# Patient Record
Sex: Male | Born: 1955 | Race: White | Hispanic: No | Marital: Married | Smoking: Never smoker
Health system: Southern US, Community
[De-identification: ages and names within clinical notes are randomized; demographics above are authoritative.]

## PROBLEM LIST (undated history)

## (undated) DIAGNOSIS — I10 Essential (primary) hypertension: Secondary | ICD-10-CM

## (undated) DIAGNOSIS — N529 Male erectile dysfunction, unspecified: Secondary | ICD-10-CM

## (undated) DIAGNOSIS — I619 Nontraumatic intracerebral hemorrhage, unspecified: Secondary | ICD-10-CM

## (undated) DIAGNOSIS — Z9289 Personal history of other medical treatment: Secondary | ICD-10-CM

## (undated) DIAGNOSIS — C801 Malignant (primary) neoplasm, unspecified: Secondary | ICD-10-CM

## (undated) HISTORY — PX: NOSE SURGERY: SHX723

## (undated) HISTORY — DX: Malignant (primary) neoplasm, unspecified: C80.1

## (undated) HISTORY — DX: Essential (primary) hypertension: I10

## (undated) HISTORY — DX: Personal history of other medical treatment: Z92.89

## (undated) HISTORY — PX: AORTIC VALVE REPLACEMENT: SHX41

## (undated) HISTORY — DX: Male erectile dysfunction, unspecified: N52.9

---

## 2004-05-27 ENCOUNTER — Inpatient Hospital Stay (HOSPITAL_COMMUNITY): Admission: EM | Admit: 2004-05-27 | Discharge: 2004-05-28 | Payer: Self-pay | Admitting: Emergency Medicine

## 2013-08-29 ENCOUNTER — Ambulatory Visit (INDEPENDENT_AMBULATORY_CARE_PROVIDER_SITE_OTHER): Payer: BC Managed Care – PPO | Admitting: Emergency Medicine

## 2013-08-29 ENCOUNTER — Other Ambulatory Visit: Payer: Self-pay | Admitting: Emergency Medicine

## 2013-08-29 VITALS — BP 140/80 | HR 86 | Temp 98.6°F | Resp 16 | Ht 73.0 in | Wt 244.0 lb

## 2013-08-29 DIAGNOSIS — IMO0001 Reserved for inherently not codable concepts without codable children: Secondary | ICD-10-CM

## 2013-08-29 DIAGNOSIS — R0789 Other chest pain: Secondary | ICD-10-CM

## 2013-08-29 DIAGNOSIS — K625 Hemorrhage of anus and rectum: Secondary | ICD-10-CM

## 2013-08-29 DIAGNOSIS — Z0000 Encounter for general adult medical examination without abnormal findings: Secondary | ICD-10-CM

## 2013-08-29 DIAGNOSIS — R141 Gas pain: Secondary | ICD-10-CM

## 2013-08-29 DIAGNOSIS — I10 Essential (primary) hypertension: Secondary | ICD-10-CM

## 2013-08-29 DIAGNOSIS — R251 Tremor, unspecified: Secondary | ICD-10-CM

## 2013-08-29 DIAGNOSIS — R259 Unspecified abnormal involuntary movements: Secondary | ICD-10-CM

## 2013-08-29 DIAGNOSIS — R030 Elevated blood-pressure reading, without diagnosis of hypertension: Secondary | ICD-10-CM

## 2013-08-29 DIAGNOSIS — R140 Abdominal distension (gaseous): Secondary | ICD-10-CM

## 2013-08-29 LAB — POCT URINALYSIS DIPSTICK
Blood, UA: NEGATIVE
Ketones, UA: NEGATIVE
Leukocytes, UA: NEGATIVE
Nitrite, UA: NEGATIVE
Protein, UA: NEGATIVE
Spec Grav, UA: 1.02
Urobilinogen, UA: 0.2
pH, UA: 6.5

## 2013-08-29 LAB — TSH: TSH: 1.88 u[IU]/mL (ref 0.350–4.500)

## 2013-08-29 LAB — IFOBT (OCCULT BLOOD): IFOBT: NEGATIVE

## 2013-08-29 LAB — COMPREHENSIVE METABOLIC PANEL
Alkaline Phosphatase: 37 U/L — ABNORMAL LOW (ref 39–117)
BUN: 13 mg/dL (ref 6–23)
Calcium: 9 mg/dL (ref 8.4–10.5)
Chloride: 106 mEq/L (ref 96–112)
Potassium: 4.1 mEq/L (ref 3.5–5.3)
Sodium: 138 mEq/L (ref 135–145)

## 2013-08-29 LAB — POCT CBC
Granulocyte percent: 67.3 %G (ref 37–80)
Hemoglobin: 15.1 g/dL (ref 14.1–18.1)
MCH, POC: 31.8 pg — AB (ref 27–31.2)
MCHC: 32.5 g/dL (ref 31.8–35.4)
MID (cbc): 0.6 (ref 0–0.9)
POC Granulocyte: 6.1 (ref 2–6.9)
POC LYMPH PERCENT: 26 %L (ref 10–50)
Platelet Count, POC: 200 10*3/uL (ref 142–424)

## 2013-08-29 LAB — LIPID PANEL
HDL: 44 mg/dL (ref >39)
LDL Cholesterol: 173 mg/dL — ABNORMAL HIGH (ref 0–99)
VLDL: 20 mg/dL (ref 0–40)

## 2013-08-29 MED ORDER — LOSARTAN POTASSIUM 100 MG PO TABS: 100.0000 mg | ORAL_TABLET | Freq: Every day | ORAL | Status: DC

## 2013-08-29 NOTE — Patient Instructions (Signed)
I am making referral to see the cardiologist and the neurologist. All of your basic blood work has been done. I will call you with the results of this. Patient declines flu shot patient declined shingles vaccine

## 2013-08-29 NOTE — Progress Notes (Addendum)
Subjective:    Patient ID: Shane Sawyer, male    DOB: 1955/11/03, 57 y.o.   MRN: [id number] This chart was scribed for Collene Gobble, MD by Valera Castle, ED Scribe. This patient was seen in room 8 and the patient's care was started at 10:30 AM.  HPI HPI Comments: Shane Sawyer is a 57 y.o. male who presents to the Emergency Department requesting an annual exam.    He reports being on blood pressure medicine, but when it ran out he started using his mother's prescription. He reports that he eats decently, and plans to work out again soon. He denies eating this morning, and only having a small amount of fluids.   He reports recent chest tightness and feeling "funny", onset 1-2 weeks ago. He states associated numbness and tingling in his feet and his hands.. He also reports chills, and "the shakes" where his hands look and feel unsteady. He reports having a stress test about 2 years ago, and everything came back negative. He also reports getting bloated often after eating, even if he doesn't eat very much. He states he gets full quickly.   He reports recently having a colonoscopy. He reports a h/o hemorrhoids from lifting weights. He reports being able to manage those. He reports EtOH use, about 6 beers a week, but denies h/o smoking. He denies IV drug use, cocaine use. He reports that his dad has cancer in his bladder that spread to his lymph nodes. He reports her mother has skin cancer. His mother had an aortic valve stent placement.   [place]   PCP - No primary provider on file.  There are no active problems to display for this patient.  No past medical history on file. No past surgical history on file.  Allergies  Allergen Reactions  . Ivp Dye [Iodinated Diagnostic Agents]   . Penicillins   . Sulfur    Prior to Admission medications   Medication Sig Start Date End Date Taking? Authorizing Provider  losartan (COZAAR) 100 MG tablet Take 100  mg by mouth daily.    Historical Provider, MD    Review of Systems  Constitutional: Positive for chills.  Respiratory: Positive for chest tightness.   Genitourinary:       Recurring hemorrhoids (manageable).  Neurological: Positive for tremors and numbness (and tingling in hands and feet).      Objective:   Physical Exam  Nursing note and vitals reviewed. Constitutional: He is oriented to person, place, and time. He appears well-developed and well-nourished. No distress.  HENT:  Head: Normocephalic and atraumatic.  Right Ear: External ear normal.  Left Ear: External ear normal.  Eyes: EOM are normal. Pupils are equal, round, and reactive to light.  Neck: Neck supple.  Cardiovascular: Normal rate.  Exam reveals no gallop and no friction rub.   No murmur heard. Mitral click noted on exam.   Pulmonary/Chest: Effort normal. No respiratory distress. He has no wheezes. He has no rales.  Genitourinary: Prostate normal.  External hemorrhoids.  Musculoskeletal: Normal range of motion.  Neurological: He is alert and oriented to person, place, and time.  Fine resting tremor of bilateral arms, worse on the left.   Skin: Skin is warm and dry.  Psychiatric: He has a normal mood and affect. His behavior is normal.    Filed Vitals:   08/29/13 0935  BP: 140/80  Pulse: 86  Temp: 98.6 F (37 C)  TempSrc: Oral  Resp: 16  Height: 6\' 1"  (1.854 m)  Weight: 244 lb (110.678 kg)  SpO2: 97%  EKG is left atrial enlargement no other abnormalities. Results for orders placed in visit on 08/29/13  POCT CBC      Result Value Range   WBC 9.0  4.6 - 10.2 K/uL   Lymph, poc 2.3  0.6 - 3.4   POC LYMPH PERCENT 26.0  10 - 50 %L   MID (cbc) 0.6  0 - 0.9   POC MID % 6.7  0 - 12 %M   POC Granulocyte 6.1  2 - 6.9   Granulocyte percent 67.3  37 - 80 %G   RBC 4.75  4.69 - 6.13 M/uL   Hemoglobin 15.1  14.1 - 18.1 g/dL   HCT, POC 45.4  09.8 - 53.7 %   MCV 97.9 (*) 80 - 97 fL   MCH, POC 31.8 (*) 27 - 31.2  pg   MCHC 32.5  31.8 - 35.4 g/dL   RDW, POC 11.9     Platelet Count, POC 200  142 - 424 K/uL   MPV 9.6  0 - 99.8 fL  GLUCOSE, POCT (MANUAL RESULT ENTRY)      Result Value Range   POC Glucose 88  70 - 99 mg/dl  IFOBT (OCCULT BLOOD)      Result Value Range   IFOBT Negative    POCT URINALYSIS DIPSTICK      Result Value Range   Color, UA yellow     Clarity, UA clear     Glucose, UA neg     Bilirubin, UA neg     Ketones, UA neg     Spec Grav, UA 1.020     Blood, UA neg     pH, UA 6.5     Protein, UA neg     Urobilinogen, UA 0.2     Nitrite, UA neg     Leukocytes, UA Negative        Assessment & Plan:  Full physical was performed. His losartan was refilled. Neurology referral was made because of his tremors. Cardiology referral was made because of his episode of chest pain about a month ago.    I personally performed the services described in this documentation, which was scribed in my presence. The recorded information has been reviewed and is accurate.

## 2013-08-31 LAB — HEPATITIS B SURFACE ANTIGEN: Hepatitis B Surface Ag: NEGATIVE

## 2013-09-10 ENCOUNTER — Ambulatory Visit (INDEPENDENT_AMBULATORY_CARE_PROVIDER_SITE_OTHER): Payer: BC Managed Care – PPO | Admitting: Neurology

## 2013-09-10 ENCOUNTER — Encounter: Payer: Self-pay | Admitting: Neurology

## 2013-09-10 VITALS — BP 140/98 | HR 88 | Ht 74.0 in | Wt 246.0 lb

## 2013-09-10 DIAGNOSIS — I10 Essential (primary) hypertension: Secondary | ICD-10-CM

## 2013-09-10 DIAGNOSIS — R251 Tremor, unspecified: Secondary | ICD-10-CM

## 2013-09-10 DIAGNOSIS — R259 Unspecified abnormal involuntary movements: Secondary | ICD-10-CM

## 2013-09-12 ENCOUNTER — Ambulatory Visit: Payer: BC Managed Care – PPO | Admitting: Neurology

## 2013-09-13 ENCOUNTER — Telehealth: Payer: Self-pay | Admitting: Emergency Medicine

## 2013-09-13 ENCOUNTER — Ambulatory Visit
Admission: RE | Admit: 2013-09-13 | Discharge: 2013-09-13 | Disposition: A | Discharge status: Home / Self Care | Payer: BC Managed Care – HMO | Source: Ambulatory Visit | Attending: Emergency Medicine | Admitting: Emergency Medicine

## 2013-09-13 ENCOUNTER — Other Ambulatory Visit: Payer: Self-pay | Admitting: Emergency Medicine

## 2013-09-13 DIAGNOSIS — R935 Abnormal findings on diagnostic imaging of other abdominal regions, including retroperitoneum: Secondary | ICD-10-CM

## 2013-09-13 DIAGNOSIS — R140 Abdominal distension (gaseous): Secondary | ICD-10-CM

## 2013-09-13 NOTE — Telephone Encounter (Signed)
done

## 2013-09-13 NOTE — Telephone Encounter (Signed)
I have reviewed his allergies and he is allergic to IVP dye. Please schedule an MRI of the liver so we can avoid iodinated contrast.

## 2013-09-14 NOTE — Progress Notes (Signed)
[facility]  PATIENT: Yahmir Sokolov DOB: 1956/08/24  HISTORICAL Mr. Kamau is a 57 years old right-handed male, referred by his primary care physician Dr. Niclas Jester for evaluation of tremor  He had a past medical history of hypertension, since he was a kid, especially after exertion, getting worse since 2013, sometimes he can feel himself shaking by putting his hands at his chest, involving both left and right hands, most noticeable when he writes, sometimes holding utensils, but does not interfering with his daily activities,  He denies trouble walking no loss sense of smell, no weakness, he complains of excessive fatigue, reported recent laboratory evaluations, with normal TSH. He denies family history of tremor.   REVIEW OF SYSTEMS: Full 14 system review of systems performed and notable only for fatigue, chest pain, snoring, flushing, achy muscles, allergies, numbness, tremor,  ALLERGIES: Allergies  Allergen Reactions  . Ivp Dye [Iodinated Diagnostic Agents]   . Penicillins   . Sulfur     HOME MEDICATIONS: Outpatient Prescriptions Prior to Visit  Medication Sig Dispense Refill  . losartan (COZAAR) 100 MG tablet Take 1 tablet (100 mg total) by mouth daily.  30 tablet  11   No facility-administered medications prior to visit.    PAST MEDICAL HISTORY: Past Medical History  Diagnosis Date  . HBP (high blood pressure)     PAST SURGICAL HISTORY: Past Surgical History  Procedure Laterality Date  . Nose surgery      times 2    FAMILY HISTORY: Family History  Problem Relation Age of Onset  . Cancer Father     SOCIAL HISTORY:  History   Social History  . Marital Status: Single    Spouse Name: N/A    Number of Children: N/A  . Years of Education: 12+   Occupational History  . Not on file.   Social History Main Topics  . Smoking status: Never Smoker   . Smokeless tobacco: Never Used  . Alcohol Use: Yes     Comment: weekly   . Drug Use: No  .  Sexual Activity: Not on file   Other Topics Concern  . Not on file   Social History Narrative   Patient lives at home girlfriend Maretta Los.   Patient has no children but one the way.    Patient is currently working, he buys cars in sell them.   Patient has some college.     PHYSICAL EXAM   Filed Vitals:   09/10/13 0856  BP: 140/98  Pulse: 88  Height: 6\' 2"  (1.88 m)  Weight: 246 lb (111.585 kg)    Not recorded    Body mass index is 31.57 kg/(m^2).   Generalized: In no acute distress  Neck: Supple, no carotid bruits   Cardiac: Regular rate rhythm  Pulmonary: Clear to auscultation bilaterally  Musculoskeletal: No deformity  Neurological examination  Mentation: Alert oriented to time, place, history taking, and causual conversation  Cranial nerve II-XII: Pupils were equal round reactive to light extraocular movements were full, visual field were full on confrontational test. facial sensation and strength were normal. hearing was intact to finger rubbing bilaterally. Uvula tongue midline.  head turning and shoulder shrug and were normal and symmetric.Tongue protrusion into cheek strength was normal.  Motor: normal tone, bulk and strength, mild bilateral hand postural tremor.  Sensory: Intact to fine touch, pinprick, preserved vibratory sensation, and proprioception at toes.  Coordination: Normal finger to nose, heel-to-shin bilaterally there was no truncal ataxia  Gait:  Rising up from seated position without assistance, normal stance, without trunk ataxia, moderate stride, good arm swing, smooth turning, able to perform tiptoe, and heel walking without difficulty.   Romberg signs: Negative  Deep tendon reflexes: Brachioradialis 2/2, biceps 2/2, triceps 2/2, patellar 2/2, Achilles 2/2, plantar responses were flexor bilaterally.   DIAGNOSTIC DATA (LABS, IMAGING, TESTING) - I reviewed patient records, labs, notes, testing and imaging myself where  available.  Lab Results  Component Value Date   WBC 9.0 08/29/2013   HGB 15.1 08/29/2013   HCT 46.5 08/29/2013   MCV 97.9* 08/29/2013      Component Value Date/Time   NA 138 08/29/2013 1058   K 4.1 08/29/2013 1058   CL 106 08/29/2013 1058   CO2 24 08/29/2013 1058   GLUCOSE 98 08/29/2013 1058   BUN 13 08/29/2013 1058   CREATININE 0.89 08/29/2013 1058   CALCIUM 9.0 08/29/2013 1058   PROT 6.8 08/29/2013 1058   ALBUMIN 4.0 08/29/2013 1058   AST 30 08/29/2013 1058   ALT 59* 08/29/2013 1058   ALKPHOS 37* 08/29/2013 1058   BILITOT 0.8 08/29/2013 1058   Lab Results  Component Value Date   CHOL 237* 08/29/2013   HDL 44 08/29/2013   LDLCALC 173* 08/29/2013   TRIG 100 08/29/2013   CHOLHDL 5.4 08/29/2013   No results found for this basename: HGBA1C   No results found for this basename: VITAMINB12   Lab Results  Component Value Date   TSH 1.880 08/29/2013      ASSESSMENT AND PLAN    57 years old right-handed Caucasian male, presenting with a long-standing history of postural tremor, most consistent with essential tremor, there was no parkinsonian features, normal laboratory evaluation including TSH, after discussed with patient, we decided to continue to observe his symptoms, come back to clinic if his symptoms worsened.      Levert Feinstein, M.D. Ph.D.  [facility] [address], [address] [place], [place] [address] [phone]

## 2013-09-18 ENCOUNTER — Other Ambulatory Visit: Payer: Self-pay | Admitting: Emergency Medicine

## 2013-09-18 DIAGNOSIS — R935 Abnormal findings on diagnostic imaging of other abdominal regions, including retroperitoneum: Secondary | ICD-10-CM

## 2013-09-27 ENCOUNTER — Ambulatory Visit
Admission: RE | Admit: 2013-09-27 | Discharge: 2013-09-27 | Disposition: A | Discharge status: Home / Self Care | Payer: BC Managed Care – HMO | Source: Ambulatory Visit | Attending: Emergency Medicine | Admitting: Emergency Medicine

## 2013-09-27 DIAGNOSIS — R935 Abnormal findings on diagnostic imaging of other abdominal regions, including retroperitoneum: Secondary | ICD-10-CM

## 2013-09-27 MED ORDER — GADOBENATE DIMEGLUMINE 529 MG/ML IV SOLN
20.0000 mL | Freq: Once | INTRAVENOUS | Status: AC | PRN
  Administered 2013-09-27: 20 mL via INTRAVENOUS

## 2013-10-17 ENCOUNTER — Telehealth: Payer: Self-pay

## 2013-10-17 NOTE — Telephone Encounter (Signed)
Patient is calling to speak with DR DAUB ONLY about some of his medication please call [phone]

## 2013-10-18 NOTE — Telephone Encounter (Signed)
Dr Cleta Alberts is with patients, I need more information from him. He is taking the losartan. He will not give me any more information he indicates he will only take 2 minutes of your time, and he will not speak to me at all. He is not very nice.

## 2013-10-19 NOTE — Telephone Encounter (Signed)
I returned patient's call to the number requested it and had to leave a voicemail

## 2013-10-19 NOTE — Telephone Encounter (Signed)
Please call.

## 2013-10-19 NOTE — Telephone Encounter (Signed)
Patient called and said he was in the shower but is out and would like Dr. Cleta Alberts to please call again.

## 2013-10-20 NOTE — Telephone Encounter (Signed)
Message left to return call.

## 2013-10-28 ENCOUNTER — Telehealth: Payer: Self-pay

## 2013-10-28 NOTE — Telephone Encounter (Signed)
Please advise on his request for Cialis.

## 2013-10-28 NOTE — Telephone Encounter (Signed)
Wants a diurectic with losartan (COZAAR) 100 MG tablet  Nevermind - got it through the cardiologist   Wants a 30 day supply of cialas  - has a coupon for this  [employer] - [address]   [phone]

## 2013-10-29 NOTE — Telephone Encounter (Signed)
Called him. There is correspondence from Dr Jacinto Halim (media tab). Dr Jacinto Halim discussed alcohol use with patient and prescribed nitroglycerin for him. He also wants him to have stress test and echo. I do not think patient has had these studies. I did not call in the meds. I left message for patient to call me back.

## 2013-10-29 NOTE — Telephone Encounter (Signed)
Patient states he does not take nitroglycerin.  States he did have stress test and echo done and he is fine. He would like Rx filled asap.

## 2013-10-29 NOTE — Telephone Encounter (Signed)
When I saw him for his office visit in the fall he was instructed to see his cardiologist. If he saw his cardiologist and had a good cardiac workup we can call in Cialis 2.5 one a day #30. Instruct him he cannot take nitroglycerin with this medication. If he did not see a cardiologist he needs to get clearance from their office first.

## 2013-10-30 NOTE — Telephone Encounter (Signed)
Call Dr. Verl Dicker office and confirmed that he had a stress test and echo done. If he had these tests  Performed go  ahead and call in Cialis 2.5 one a day he can have #30  No refills

## 2013-11-01 NOTE — Telephone Encounter (Signed)
Called Dr Einar Gip spoke to [facility] and patient has not had them yet. He is having the stress test Monday and echo on Wednesday. He called to reschedule them.

## 2013-12-06 ENCOUNTER — Encounter: Payer: Self-pay | Admitting: Emergency Medicine

## 2014-06-19 ENCOUNTER — Other Ambulatory Visit: Payer: Self-pay

## 2014-06-19 DIAGNOSIS — R030 Elevated blood-pressure reading, without diagnosis of hypertension: Principal | ICD-10-CM

## 2014-06-19 DIAGNOSIS — IMO0001 Reserved for inherently not codable concepts without codable children: Secondary | ICD-10-CM

## 2014-06-19 MED ORDER — LOSARTAN POTASSIUM 100 MG PO TABS: 100.0000 mg | ORAL_TABLET | Freq: Every day | ORAL | Status: DC

## 2014-06-20 ENCOUNTER — Other Ambulatory Visit: Payer: Self-pay

## 2014-06-20 DIAGNOSIS — R030 Elevated blood-pressure reading, without diagnosis of hypertension: Principal | ICD-10-CM

## 2014-06-20 DIAGNOSIS — IMO0001 Reserved for inherently not codable concepts without codable children: Secondary | ICD-10-CM

## 2014-06-20 MED ORDER — LOSARTAN POTASSIUM 100 MG PO TABS: 100.0000 mg | ORAL_TABLET | Freq: Every day | ORAL | Status: DC

## 2014-08-13 IMAGING — US US ABDOMEN COMPLETE
1 series · 13 of 25 positions shown · non-contrast
Comparison: None.

CLINICAL DATA: Bloating.

EXAM:
ULTRASOUND ABDOMEN COMPLETE

[Series 1: us abdomen complete · 0.35mm/px · 13 of 69 slices shown]
[im 1/69]
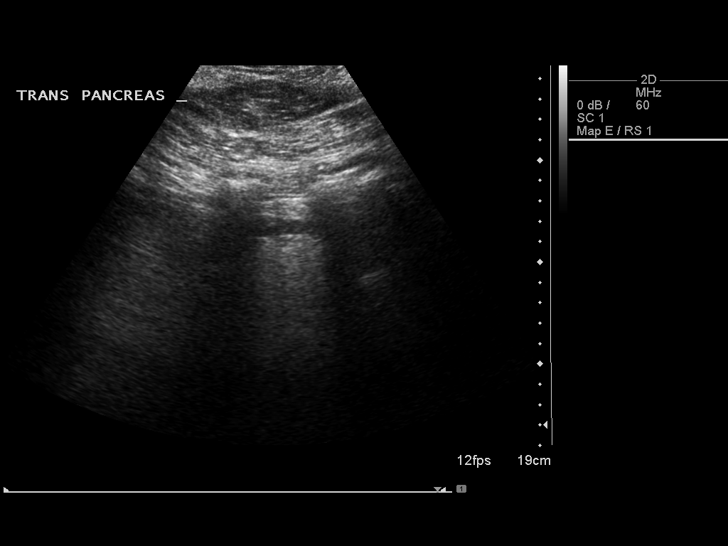
[im 6/69]
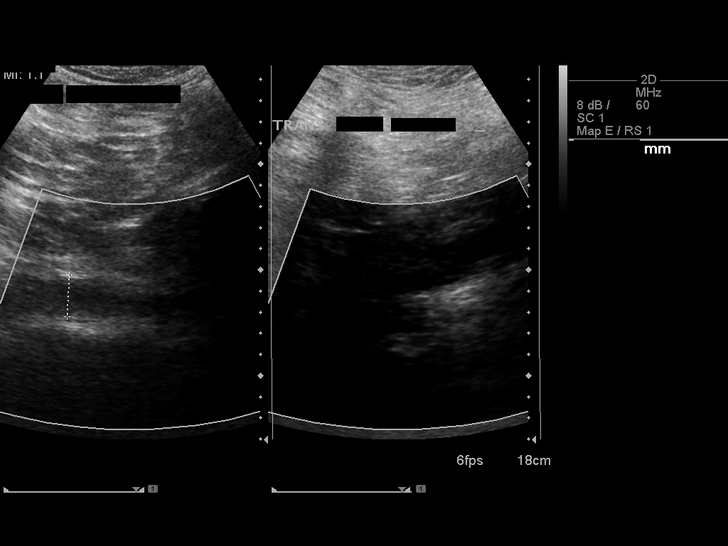
[im 12/69]
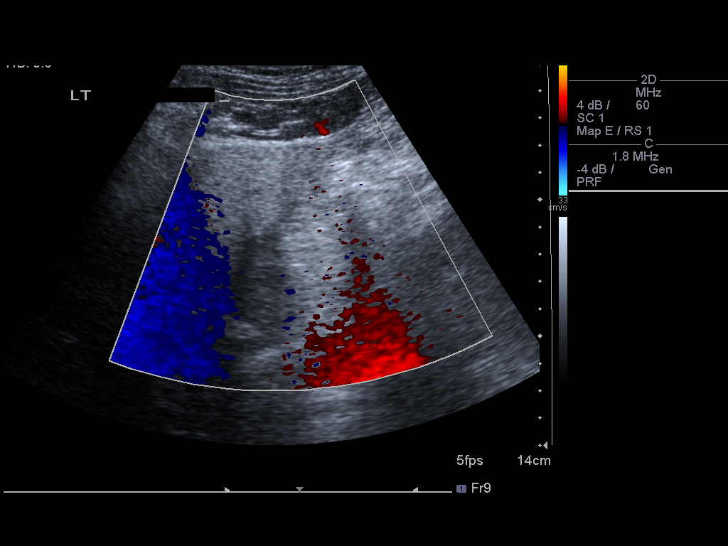
[im 18/69]
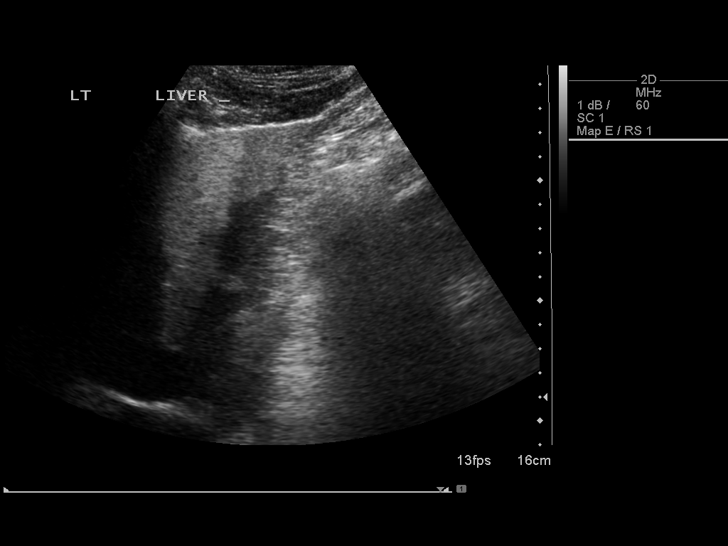
[im 23/69]
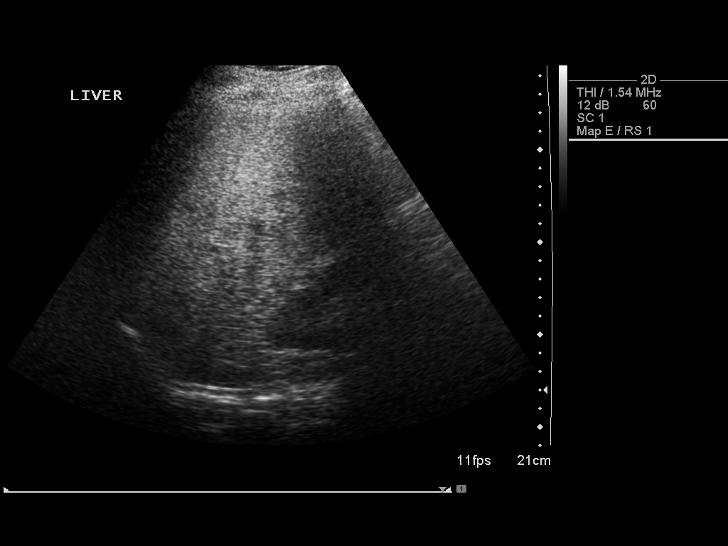
[im 29/69]
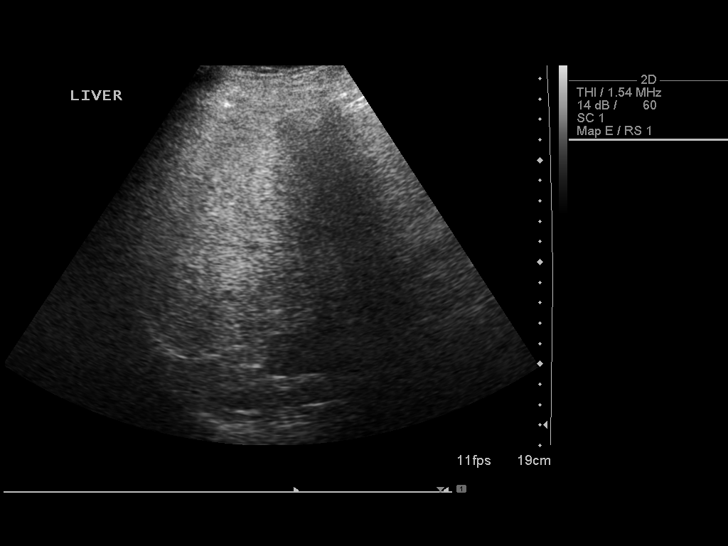
[im 35/69]
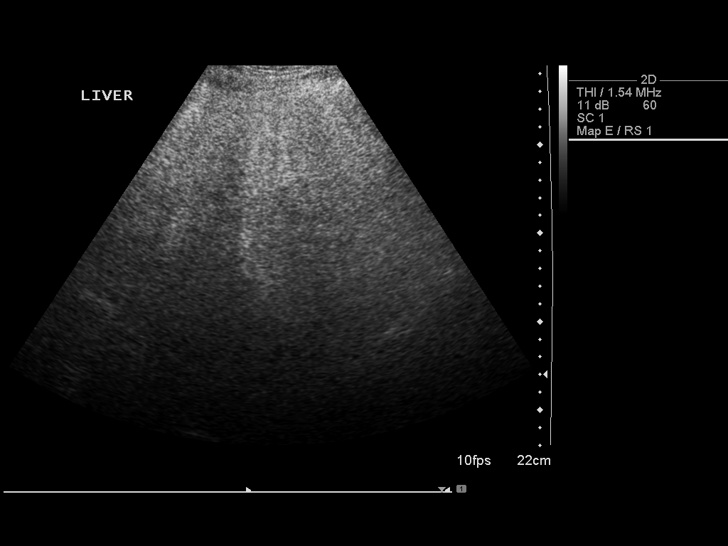
[im 40/69]
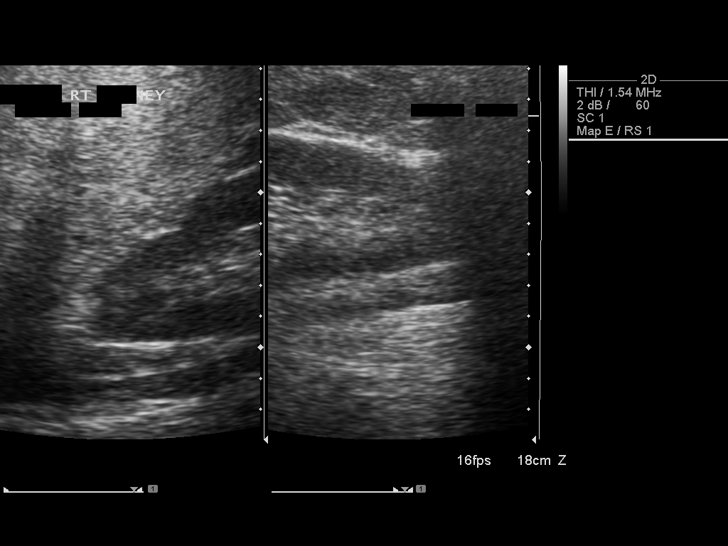
[im 46/69]
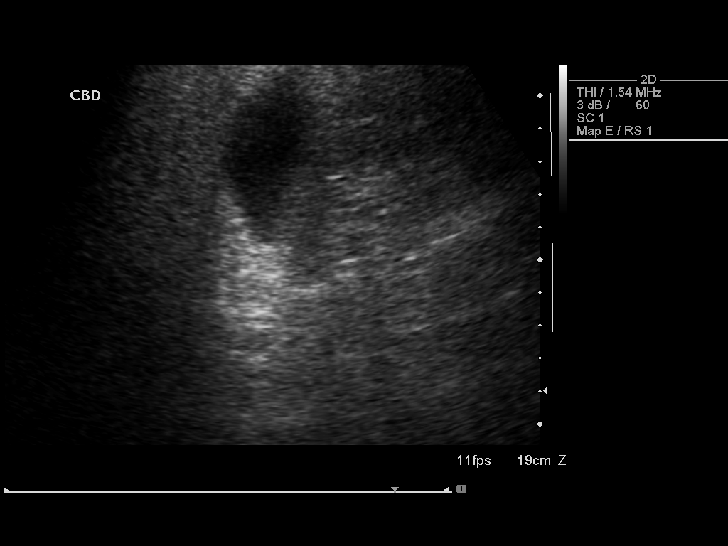
[im 52/69]
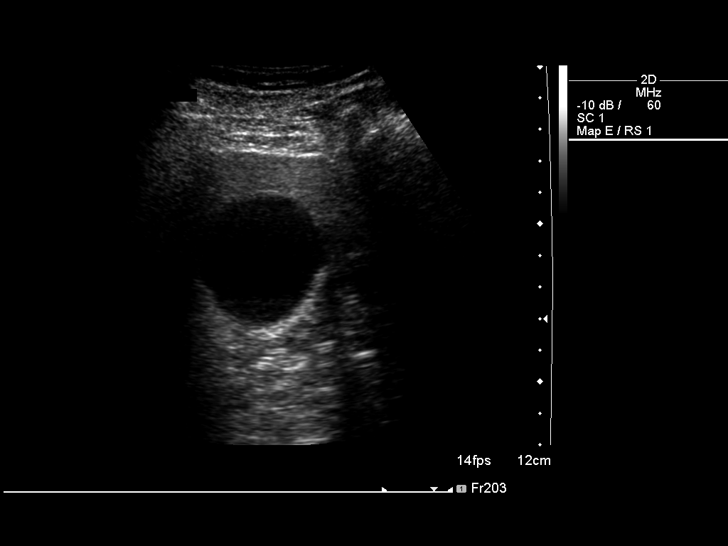
[im 57/69]
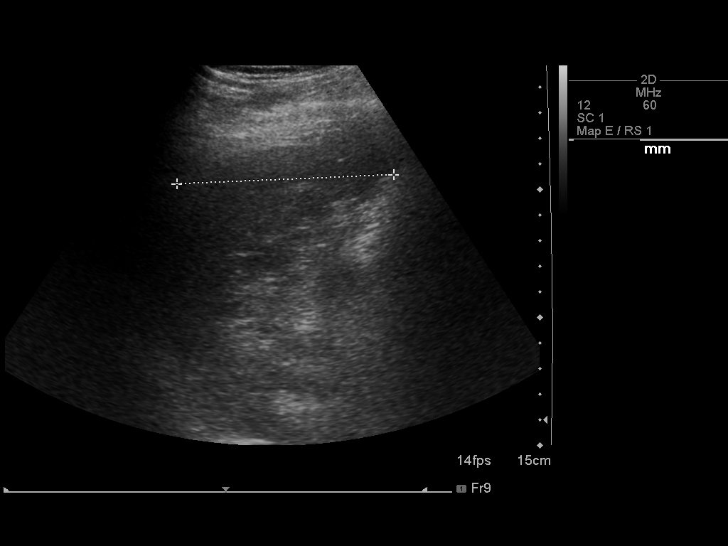
[im 63/69]
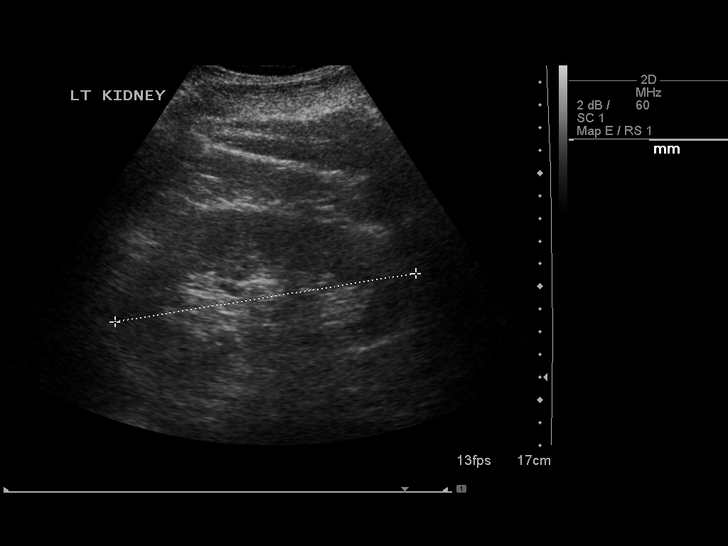
[im 69/69]
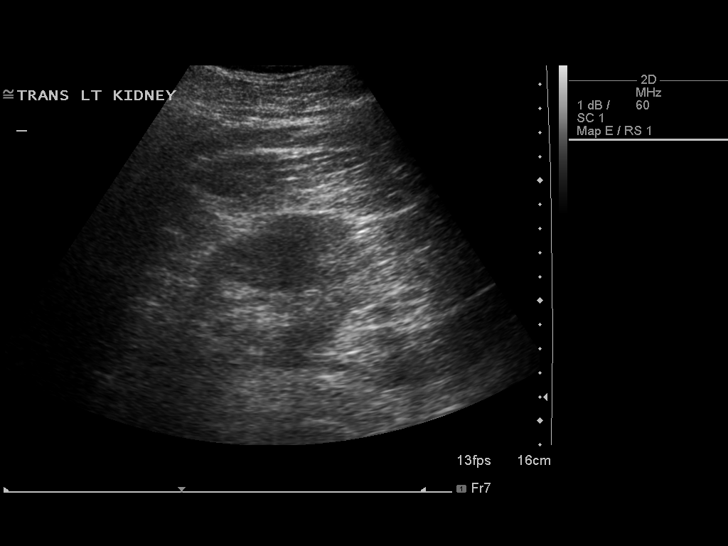

[13 of 25 positions shown; findings below may reference images not displayed]

FINDINGS: Gallbladder

No gallstones or wall thickening visualized. No sonographic Murphy
sign noted.

Common bile duct

Diameter: 2.8 mm.

Liver

Diffuse fatty infiltration of liver noted. Liver measures 18.2 cm in
diameter. There is a relative hypoechoic region in the left lobe of
the liver. This measures approximately 4.2 x 2.2 x 3.0 cm. This
could represent focal fatty sparing versus a mass. CT or MRI of the
liver should be considered for further evaluation .

IVC

No abnormality visualized.

Pancreas

Visualized portion unremarkable.

Spleen

Size and appearance within normal limits.

Right Kidney

Length: 12.5 cm. Echogenicity within normal limits. No mass or
hydronephrosis visualized.

Left Kidney

Length: 13.4 cm. Echogenicity within normal limits. No mass or
hydronephrosis visualized.

Abdominal aorta

No aneurysm visualized.
IMPRESSION: 1. Diffuse fatty infiltration of the liver.
2. Approximately 4.2 cm maximum diameter relative hypoechoic region
in the left lobe of the liver. This may represent focal fatty
sparing. A mass lesion cannot be excluded. CT or MRI of the liver
should be considered for further evaluation.

## 2014-08-19 ENCOUNTER — Other Ambulatory Visit: Payer: Self-pay | Admitting: Emergency Medicine

## 2014-09-07 ENCOUNTER — Ambulatory Visit (INDEPENDENT_AMBULATORY_CARE_PROVIDER_SITE_OTHER): Payer: BC Managed Care – PPO | Admitting: Emergency Medicine

## 2014-09-07 VITALS — BP 138/78 | HR 76 | Temp 98.2°F | Resp 16 | Ht 73.0 in | Wt 237.8 lb

## 2014-09-07 DIAGNOSIS — F1010 Alcohol abuse, uncomplicated: Secondary | ICD-10-CM

## 2014-09-07 DIAGNOSIS — M722 Plantar fascial fibromatosis: Secondary | ICD-10-CM

## 2014-09-07 DIAGNOSIS — I10 Essential (primary) hypertension: Secondary | ICD-10-CM

## 2014-09-07 DIAGNOSIS — G5762 Lesion of plantar nerve, left lower limb: Secondary | ICD-10-CM

## 2014-09-07 DIAGNOSIS — E782 Mixed hyperlipidemia: Secondary | ICD-10-CM

## 2014-09-07 MED ORDER — NAPROXEN SODIUM 550 MG PO TABS: 550.0000 mg | ORAL_TABLET | Freq: Two times a day (BID) | ORAL | Status: DC

## 2014-09-07 MED ORDER — LOSARTAN POTASSIUM 100 MG PO TABS: 100.0000 mg | ORAL_TABLET | Freq: Every day | ORAL | Status: DC

## 2014-09-07 NOTE — Patient Instructions (Signed)
Ringworm, Nail A fungal infection of the nail (tinea unguium/onychomycosis) is common. It is common as the visible part of the nail is composed of dead cells which have no blood supply to help prevent infection. It occurs because fungi are everywhere and will pick any opportunity to grow on any dead material. Because nails are very slow growing they require up to 2 years of treatment with anti-fungal medications. The entire nail back to the base is infected. This includes approximately  of the nail which you cannot see. If your caregiver has prescribed a medication by mouth, take it every day and as directed. No progress will be seen for at least 6 to 9 months. Do not be disappointed! Because fungi live on dead cells with little or no exposure to blood supply, medication delivery to the infection is slow; thus the cure is slow. It is also why you can observe no progress in the first 6 months. The nail becoming cured is the base of the nail, as it has the blood supply. Topical medication such as creams and ointments are usually not effective. Important in successful treatment of nail fungus is closely following the medication regimen that your doctor prescribes. Sometimes you and your caregiver may elect to speed up this process by surgical removal of all the nails. Even this may still require 6 to 9 months of additional oral medications. See your caregiver as directed. Remember there will be no visible improvement for at least 6 months. See your caregiver sooner if other signs of infection (redness and swelling) develop. Document Released: 10/14/2000 Document Revised: 01/09/2012 Document Reviewed: 12/23/2008 Exit[employer] Patient Information 2015 [employer]. This information is not intended to replace advice given to you by your health care provider. Make sure you discuss any questions you have with your health care provider.  

## 2014-09-07 NOTE — Progress Notes (Addendum)
[facility] [address] [place] [address] [phone]  Date:  09/07/2014   Name:  Shane Sawyer   DOB:  [birth year]   MRN:  [id number]  PCP:  [name], MD    Chief Complaint: Medication Refill and Foot Pain   History of Present Illness:  Shane Sawyer is a 58 y.o. very pleasant male patient who presents with the following:  History of hypertension and weekend alcohol use.  Non smoker.  Stopped lipid meds as he was controlling his cholesterol with supplements and vitamins Has pain in feet worse on left.  Pain on arising and walking in both heels Pain now in left ball of foot with no history of injury.  Says occasionally becomes numb No improvement with over the counter medications or other home remedies.  Denies other complaint or health concern today.   Patient Active Problem List   Diagnosis Date Noted  . Tremor 09/10/2013  . Unspecified essential hypertension 08/29/2013    Past Medical History  Diagnosis Date  . HBP (high blood pressure)     Past Surgical History  Procedure Laterality Date  . Nose surgery      times 2    History  Substance Use Topics  . Smoking status: Never Smoker   . Smokeless tobacco: Never Used  . Alcohol Use: Yes     Comment: weekly     Family History  Problem Relation Age of Onset  . Cancer Father     Allergies  Allergen Reactions  . Ivp Dye [Iodinated Diagnostic Agents]   . Penicillins   . Sulfur     Medication list has been reviewed and updated.  Current Outpatient Prescriptions on File Prior to Visit  Medication Sig Dispense Refill  . losartan (COZAAR) 100 MG tablet Take 1 tablet (100 mg total) by mouth daily. NO MORE REFILLS WITHOUT OFFICE VISIT - 2ND NOTICE 15 tablet 0   No current facility-administered medications on file prior to visit.    Review of Systems:  As per HPI, otherwise negative.    Physical Examination: Filed Vitals:   09/07/14 1051  BP: 138/78  Pulse: 76  Temp: 98.2  F (36.8 C)  Resp: 16   Filed Vitals:   09/07/14 1051  Height: 6\' 1"  (1.854 m)  Weight: 237 lb 12.8 oz (107.865 kg)   Body mass index is 31.38 kg/(m^2). Ideal Body Weight: Weight in (lb) to have BMI = 25: 189.1  GEN: WDWN, NAD, Non-toxic, A & O x 3 HEENT: Atraumatic, Normocephalic. Neck supple. No masses, No LAD. Ears and Nose: No external deformity. CV: RRR, No M/G/R. No JVD. No thrill. No extra heart sounds. PULM: CTA B, no wheezes, crackles, rhonchi. No retractions. No resp. distress. No accessory muscle use. ABD: S, NT, ND, +BS. No rebound. No HSM. EXTR: No c/c/e NEURO Normal gait.  PSYCH: Normally interactive. Conversant. Not depressed or anxious appearing.  Calm demeanor.  FEET:  Tender forward edge of heel.  Tender over metatarsal head on left.  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

## 2014-09-22 ENCOUNTER — Ambulatory Visit (INDEPENDENT_AMBULATORY_CARE_PROVIDER_SITE_OTHER): Payer: BC Managed Care – PPO

## 2014-09-22 ENCOUNTER — Encounter: Payer: Self-pay | Admitting: Podiatry

## 2014-09-22 ENCOUNTER — Ambulatory Visit (INDEPENDENT_AMBULATORY_CARE_PROVIDER_SITE_OTHER): Payer: BC Managed Care – PPO | Admitting: Podiatry

## 2014-09-22 VITALS — BP 126/86 | HR 81 | Resp 16

## 2014-09-22 DIAGNOSIS — M722 Plantar fascial fibromatosis: Secondary | ICD-10-CM

## 2014-09-22 DIAGNOSIS — M2041 Other hammer toe(s) (acquired), right foot: Secondary | ICD-10-CM

## 2014-09-22 DIAGNOSIS — L259 Unspecified contact dermatitis, unspecified cause: Secondary | ICD-10-CM

## 2014-09-22 MED ORDER — TRIAMCINOLONE ACETONIDE 10 MG/ML IJ SUSP
10.0000 mg | Freq: Once | INTRAMUSCULAR | Status: AC
  Administered 2014-09-22: 10 mg

## 2014-09-22 MED ORDER — PREDNISONE 10 MG PO TABS: ORAL_TABLET | ORAL | Status: DC

## 2014-09-22 NOTE — Patient Instructions (Signed)

## 2014-09-22 NOTE — Progress Notes (Signed)
   Subjective:    Patient ID: Shane Sawyer, male    DOB: 06/18/1956, 58 y.o.   MRN: [id number]  HPI Comments: "I have pain in both feet"  Patient c/o sharp plantar heel left for several months. Stiffness AM not really painful. PCP Rx'd naproxen which helped. Tried heel pads in shoes. On feet a lot doing auctions. Says both feet ache a lot in the ball of foot, depending on activity.   Also, questions about thick, dark nails.  Foot Pain      Review of Systems  Musculoskeletal: Positive for gait problem.  All other systems reviewed and are negative.      Objective:   Physical Exam        Assessment & Plan:

## 2014-09-22 NOTE — Progress Notes (Signed)
Subjective:     Patient ID: Shane Sawyer, male   DOB: [birth year], 58 y.o.   MRN: [id number]  HPI patient presents with significant dry skin and is noted to have discomfort of a moderate to severe nature plantar aspect left heel at the insertion of the tendon into the calcaneus heel bone. And states it's been bothering him a long time and that the dry skin and also thick nails also bother him but he does have a history of occasional elevated liver profile   Review of Systems  All other systems reviewed and are negative.      Objective:   Physical Exam  Constitutional: He is oriented to person, place, and time.  Cardiovascular: Intact distal pulses.   Musculoskeletal: Normal range of motion.  Neurological: He is oriented to person, place, and time.  Skin: Skin is warm.  Nursing note and vitals reviewed.  neurovascular status intact with muscle strength adequate and range of motion subtalar midtarsal joint within normal limits. Patient's found to have moderate equinus and is noted to have good digital perfusion and very dry skin plantar aspect of both feet. Patient has discomfort in the left plantar fascia at the insertion of the tendon into the calcaneus with inflammation and fluid noted and does have moderate forefoot pain across both metatarsal phalangeal joints numbers 23 and 4     Assessment:     Inflammatory capsulitis with plantar fasciitis left and also dermatitis condition and hammertoe second right noted when questioned    Plan:     H&P and x-rays reviewed. Today I injected the left plantar fascia 3 mg Kenalog 5 mg Xylocaine and I went ahead and I placed in a fascially brace and placed on Sterapred DS 12 day Dosepak. Reappoint when returned

## 2014-10-06 ENCOUNTER — Ambulatory Visit: Payer: BC Managed Care – PPO | Admitting: Podiatry

## 2014-12-02 ENCOUNTER — Ambulatory Visit: Payer: Self-pay | Admitting: Podiatry

## 2014-12-04 ENCOUNTER — Ambulatory Visit (INDEPENDENT_AMBULATORY_CARE_PROVIDER_SITE_OTHER): Payer: BLUE CROSS/BLUE SHIELD

## 2014-12-04 ENCOUNTER — Ambulatory Visit (INDEPENDENT_AMBULATORY_CARE_PROVIDER_SITE_OTHER): Payer: BLUE CROSS/BLUE SHIELD | Admitting: Podiatry

## 2014-12-04 DIAGNOSIS — M779 Enthesopathy, unspecified: Secondary | ICD-10-CM

## 2014-12-04 DIAGNOSIS — M79672 Pain in left foot: Secondary | ICD-10-CM

## 2014-12-04 DIAGNOSIS — R609 Edema, unspecified: Secondary | ICD-10-CM

## 2014-12-04 LAB — RHEUMATOID FACTOR: Rhuematoid fact SerPl-aCnc: <10 IU/mL (ref <=14)

## 2014-12-04 LAB — URIC ACID: URIC ACID, SERUM: 5.6 mg/dL (ref 4.0–7.8)

## 2014-12-04 LAB — C-REACTIVE PROTEIN

## 2014-12-04 MED ORDER — TRIAMCINOLONE ACETONIDE 10 MG/ML IJ SUSP
10.0000 mg | Freq: Once | INTRAMUSCULAR | Status: AC
  Administered 2014-12-04: 10 mg

## 2014-12-04 NOTE — Progress Notes (Signed)
   Subjective:    Patient ID: Shane Sawyer, male    DOB: [birth year], 59 y.o.   MRN: [id number]  HPI Comments: Pt states 3 - 4 days ago felt a pop on the top of the left midfoot while standing on his toes.  Pt complains of pain and swelling in the left dorsal foot, pt states he has been using ice drenches for comfort.  Foot Pain      Review of Systems  All other systems reviewed and are negative.      Objective:   Physical Exam        Assessment & Plan:

## 2014-12-04 NOTE — Progress Notes (Signed)
Subjective:     Patient ID: Shane Sawyer, male   DOB: [birth year], 59 y.o.   MRN: [id number]  HPI patient states my left foot has been sore for a while and the last 3 or 4 days it really gotten worse. I have to admit it's never really gotten better when you treated me last year and I should've been in earlier but I did not do to work area states that his forefoot still hurts on both feet and his heels still hurt on both feet   Review of Systems     Objective:   Physical Exam Neurovascular status unchanged with muscle strength adequate and range of motion subtalar midtarsal joint diminished but intact. There is elevation of the lesser digits with edema in the second third fourth fifth metatarsal joints of a moderate nature bilateral and there is +2 pitting edema in the left forefoot with quite a bit of discomfort in the lateral side of the foot around the peroneal tendon group. I tested muscle strength and found to be adequate in this area    Assessment:    probable acute tendinitis of the peroneal group secondary to turning his foot along with concerns for systemic disease due to the long-term swelling and pain of both feet and the chronic discomfort he have in his lesser metatarsophalangeal joints    Plan:     Reviewed both conditions and x-rays and today did a careful lateral injection of the midtarsal joint around the peroneal tendon 3 mg Kenalog 5 mill grams Xylocaine and applied Unna boot and Ace wrap and surgical shoe to reduce swelling. Told him to take it off if any discoloration of his toe should occur and reappoint again in the next 10 days to reevaluate as we did send him for arthritic profile blood work to try to rule out systemic disease

## 2014-12-05 LAB — SEDIMENTATION RATE: SED RATE: 4 mm/h (ref 0–16)

## 2014-12-05 LAB — ANA: Anti Nuclear Antibody(ANA): NEGATIVE

## 2014-12-15 ENCOUNTER — Ambulatory Visit: Payer: BLUE CROSS/BLUE SHIELD | Admitting: Podiatry

## 2014-12-18 ENCOUNTER — Encounter: Payer: Self-pay | Admitting: Podiatry

## 2014-12-18 ENCOUNTER — Ambulatory Visit (INDEPENDENT_AMBULATORY_CARE_PROVIDER_SITE_OTHER): Payer: BLUE CROSS/BLUE SHIELD | Admitting: Podiatry

## 2014-12-18 VITALS — BP 128/78 | HR 79 | Resp 16

## 2014-12-18 DIAGNOSIS — M79672 Pain in left foot: Secondary | ICD-10-CM

## 2014-12-18 DIAGNOSIS — M779 Enthesopathy, unspecified: Secondary | ICD-10-CM

## 2014-12-18 DIAGNOSIS — M8430XA Stress fracture, unspecified site, initial encounter for fracture: Secondary | ICD-10-CM

## 2014-12-18 MED ORDER — TRAMADOL HCL 50 MG PO TABS: 50.0000 mg | ORAL_TABLET | Freq: Three times a day (TID) | ORAL | Status: DC

## 2014-12-18 NOTE — Progress Notes (Signed)
Subjective:     Patient ID: Shane Sawyer, male   DOB: [birth year], 59 y.o.   MRN: [id number]  HPI patient states that I'm still having a lot of pain in the top and outside of my left foot and also my heel still bother me at times after I been on cement floors all day.   Review of Systems     Objective:   Physical Exam Neurovascular status was intact with no other change in health history and blood work which was normal and reviewed with patient. Continues to have discomfort in the dorsal lateral aspect left foot with inflammation and fluid buildup and pain and mild to moderate discomfort in the heel region bilateral    Assessment:     Continued acute tendinitis with possibility for some stress fracture dorsal lateral aspect left foot and plantar fasciitis bilateral    Plan:     At this time I went ahead and have acute ice therapy and dispensed air fracture walker for the left to completely immobilize the forefoot and heel. This is a very difficult problem given his activity levels and cement floors that he's on but we will continue to work with this from a conservative standpoint

## 2015-01-15 ENCOUNTER — Ambulatory Visit (INDEPENDENT_AMBULATORY_CARE_PROVIDER_SITE_OTHER): Payer: BLUE CROSS/BLUE SHIELD | Admitting: Podiatry

## 2015-01-15 ENCOUNTER — Encounter: Payer: Self-pay | Admitting: Podiatry

## 2015-01-15 VITALS — BP 152/99 | HR 81 | Resp 14

## 2015-01-15 DIAGNOSIS — M722 Plantar fascial fibromatosis: Secondary | ICD-10-CM

## 2015-01-15 MED ORDER — TRIAMCINOLONE ACETONIDE 10 MG/ML IJ SUSP
10.0000 mg | Freq: Once | INTRAMUSCULAR | Status: AC
  Administered 2015-01-15: 10 mg

## 2015-01-15 NOTE — Patient Instructions (Signed)

## 2015-01-15 NOTE — Progress Notes (Signed)
Subjective:     Patient ID: Shane Sawyer, male   DOB: [birth year], 59 y.o.   MRN: [id number]  HPI patient presents stating my heels are killing me with my right hurting me more than my left and I am tired of the pain. The top of my left foot is feeling much better   Review of Systems     Objective:   Physical Exam Neurovascular status intact muscle strength adequate with significant discomfort medial fascial band bilateral at the insertion of the plantar fascia into the calcaneus with the right being more sore than the left currently    Assessment:     Continued acute plantar fasciitis that has not response so far to conservative care    Plan:     Reviewed that one point this may require surgery but at this time I did reinject the plantar fascia 3 mg Kenalog 5 mg Xylocaine and instructed on taking his steroid pack that he has at home and scanned for custom orthotic devices. Advised on the importance of physical therapy stretching and wearing supportive shoe gear and reappoint 4 weeks to see how he is responding

## 2015-01-15 NOTE — Progress Notes (Signed)
   Subjective:    Patient ID: Shane Sawyer, male    DOB: 1956/07/12, 59 y.o.   MRN: [id number]  HPI  B/L Heel pain-  tendons do not hurt, but heels are killing me.  Review of Systems  All other systems reviewed and are negative.      Objective:   Physical Exam        Assessment & Plan:

## 2015-02-12 ENCOUNTER — Ambulatory Visit (INDEPENDENT_AMBULATORY_CARE_PROVIDER_SITE_OTHER): Payer: BLUE CROSS/BLUE SHIELD | Admitting: Podiatry

## 2015-02-12 ENCOUNTER — Encounter: Payer: Self-pay | Admitting: Podiatry

## 2015-02-12 VITALS — BP 150/95 | HR 86 | Resp 14

## 2015-02-12 DIAGNOSIS — M722 Plantar fascial fibromatosis: Secondary | ICD-10-CM | POA: Diagnosis not present

## 2015-02-12 MED ORDER — TRIAMCINOLONE ACETONIDE 10 MG/ML IJ SUSP
10.0000 mg | Freq: Once | INTRAMUSCULAR | Status: AC
  Administered 2015-02-12: 10 mg

## 2015-02-12 NOTE — Progress Notes (Signed)
Subjective:     Patient ID: Shane Sawyer, male   DOB: Oct 15, 1956, 59 y.o.   MRN: [id number]  HPI patient states the left foot feels really good but I'm asked up the right foot by been to active   Review of Systems     Objective:   Physical Exam Neurovascular status intact muscle strength adequate range of motion within normal limits and is noted to have moderate discomfort plantar aspect right heel at the insertion of the tendon and on the left it is doing much much better with minimal discomfort noted    Assessment:     Plantar fasciitis still present right improved left with orthotics to be dispensed    Plan:     Last injection right currently was administered 3 mg Kenalog 5 mill grams Xylocaine and orthotics dispensed with instructions on usage and reappoint in 1 month

## 2015-03-13 ENCOUNTER — Other Ambulatory Visit: Payer: Self-pay | Admitting: Emergency Medicine

## 2015-03-16 ENCOUNTER — Ambulatory Visit: Payer: BLUE CROSS/BLUE SHIELD | Admitting: Podiatry

## 2015-04-09 ENCOUNTER — Telehealth: Payer: Self-pay

## 2015-04-09 MED ORDER — LOSARTAN POTASSIUM 100 MG PO TABS: 100.0000 mg | ORAL_TABLET | Freq: Every day | ORAL | Status: DC

## 2015-04-09 NOTE — Telephone Encounter (Signed)
Pt left his losartan (COZAAR) 100 MG tablet [[id number]] at home. He is in [place]. He would like [place] to send him a script for 5 pills of Losartan to [facility]  [place], [place]. Please advise at [phone]

## 2015-04-09 NOTE — Telephone Encounter (Signed)
Rx sent for 5 pills. Pt notified.

## 2015-04-09 NOTE — Telephone Encounter (Signed)
Please advise 

## 2015-04-21 ENCOUNTER — Ambulatory Visit (INDEPENDENT_AMBULATORY_CARE_PROVIDER_SITE_OTHER): Payer: BLUE CROSS/BLUE SHIELD | Admitting: Emergency Medicine

## 2015-04-21 VITALS — BP 142/90 | HR 93 | Temp 98.0°F | Resp 16 | Ht 73.0 in | Wt 239.0 lb

## 2015-04-21 DIAGNOSIS — N521 Erectile dysfunction due to diseases classified elsewhere: Secondary | ICD-10-CM

## 2015-04-21 DIAGNOSIS — G4719 Other hypersomnia: Secondary | ICD-10-CM | POA: Diagnosis not present

## 2015-04-21 DIAGNOSIS — I10 Essential (primary) hypertension: Secondary | ICD-10-CM

## 2015-04-21 DIAGNOSIS — M25511 Pain in right shoulder: Secondary | ICD-10-CM | POA: Diagnosis not present

## 2015-04-21 LAB — COMPREHENSIVE METABOLIC PANEL
ALT: 50 U/L (ref 0–53)
AST: 32 U/L (ref 0–37)
Albumin: 4.1 g/dL (ref 3.5–5.2)
Alkaline Phosphatase: 41 U/L (ref 39–117)
BILIRUBIN TOTAL: 0.6 mg/dL (ref 0.2–1.2)
BUN: 20 mg/dL (ref 6–23)
CO2: 23 mEq/L (ref 19–32)
Calcium: 8.7 mg/dL (ref 8.4–10.5)
Chloride: 107 mEq/L (ref 96–112)
Creat: 1.22 mg/dL (ref 0.50–1.35)
GLUCOSE: 90 mg/dL (ref 70–99)
Potassium: 4 mEq/L (ref 3.5–5.3)
SODIUM: 143 meq/L (ref 135–145)
TOTAL PROTEIN: 6.7 g/dL (ref 6.0–8.3)

## 2015-04-21 LAB — LIPID PANEL
Cholesterol: 208 mg/dL — ABNORMAL HIGH (ref 0–200)
HDL: 31 mg/dL — ABNORMAL LOW (ref >=40)
LDL Cholesterol: 149 mg/dL — ABNORMAL HIGH (ref 0–99)
Total CHOL/HDL Ratio: 6.7 Ratio
Triglycerides: 139 mg/dL (ref <150)
VLDL: 28 mg/dL (ref 0–40)

## 2015-04-21 MED ORDER — LOSARTAN POTASSIUM-HCTZ 100-25 MG PO TABS: 1.0000 | ORAL_TABLET | Freq: Every day | ORAL | Status: DC

## 2015-04-21 MED ORDER — NAPROXEN SODIUM 550 MG PO TABS: 550.0000 mg | ORAL_TABLET | Freq: Two times a day (BID) | ORAL | Status: DC

## 2015-04-21 MED ORDER — TADALAFIL 20 MG PO TABS: 20.0000 mg | ORAL_TABLET | ORAL | Status: DC | PRN

## 2015-04-21 NOTE — Progress Notes (Signed)
Subjective:  Patient ID: Shane Sawyer, male    DOB: [birth year]  Age: 59 y.o. MRN: [id number]  CC: Medication Refill   HPI Wlliam Grosso presents  with a history of hypertension. He's been out of his medication for 3 days. He has a history of hyperlipidemia was never wanted to take medication thinking he can control his condition with diet and activity. He's gained weight. He has symptoms of fatigue and daytime sleepiness and snores at night. Says he feels refreshed by a nap. He has erectile dysfunction.  He complains of several years duration history of limitation of motion in his right shoulder. He thinks he has rotator cuff injury. He has no direct history of injury. Takes no medication for the problem.  History Deunte has a past medical history of HBP (high blood pressure).   He has past surgical history that includes Nose surgery.   His  family history includes Cancer in his father.  He   reports that he has never smoked. He has never used smokeless tobacco. He reports that he drinks alcohol. He reports that he does not use illicit drugs.  Outpatient Prescriptions Prior to Visit  Medication Sig Dispense Refill  . losartan (COZAAR) 100 MG tablet Take 1 tablet (100 mg total) by mouth daily. 5 tablet 0  . naproxen sodium (ANAPROX DS) 550 MG tablet Take 1 tablet (550 mg total) by mouth 2 (two) times daily with a meal. (Patient not taking: Reported on 04/21/2015) 40 tablet 0  . traMADol (ULTRAM) 50 MG tablet Take 1 tablet (50 mg total) by mouth 3 (three) times daily. (Patient not taking: Reported on 04/21/2015) 90 tablet 2   No facility-administered medications prior to visit.    History   Social History  . Marital Status: Single    Spouse Name: N/A  . Number of Children: N/A  . Years of Education: 12+   Social History Main Topics  . Smoking status: Never Smoker   . Smokeless tobacco: Never Used  . Alcohol Use: Yes     Comment: weekly   . Drug Use: No  . Sexual Activity:  Not on file   Other Topics Concern  . None   Social History Narrative   Patient lives at home girlfriend [name].   Patient has no children but one the way.    Patient is currently working, he buys cars in sell them.   Patient has some college.     Review of Systems  Constitutional: Negative for fever, chills and appetite change.  HENT: Negative for congestion, ear pain, postnasal drip, sinus pressure and sore throat.   Eyes: Negative for pain and redness.  Respiratory: Negative for cough, shortness of breath and wheezing.   Cardiovascular: Negative for leg swelling.  Gastrointestinal: Negative for nausea, vomiting, abdominal pain, diarrhea, constipation and blood in stool.  Endocrine: Negative for polyuria.  Genitourinary: Negative for dysuria, urgency, frequency and flank pain.  Musculoskeletal: Negative for gait problem.  Skin: Negative for rash.  Neurological: Negative for weakness and headaches.  Psychiatric/Behavioral: Negative for confusion and decreased concentration. The patient is not nervous/anxious.     Objective:  BP 142/90 mmHg  Pulse 93  Temp(Src) 98 F (36.7 C) (Oral)  Resp 16  Ht 6\' 1"  (1.854 m)  Wt 239 lb (108.41 kg)  BMI 31.54 kg/m2  SpO2 98%  Physical Exam  Constitutional: He is oriented to person, place, and time. He appears well-developed and well-nourished. No distress.  HENT:  Head: Normocephalic  and atraumatic.  Right Ear: External ear normal.  Left Ear: External ear normal.  Nose: Nose normal.  Eyes: Conjunctivae and EOM are normal. Pupils are equal, round, and reactive to light. No scleral icterus.  Neck: Normal range of motion. Neck supple. No tracheal deviation present.  Cardiovascular: Normal rate, regular rhythm and normal heart sounds.   Pulmonary/Chest: Effort normal. No respiratory distress. He has no wheezes. He has no rales.  Abdominal: He exhibits no mass. There is no tenderness. There is no rebound and no guarding.    Musculoskeletal: He exhibits no edema.  Lymphadenopathy:    He has no cervical adenopathy.  Neurological: He is alert and oriented to person, place, and time. Coordination normal.  Skin: Skin is warm and dry. No rash noted.  Psychiatric: He has a normal mood and affect. His behavior is normal.      Assessment & Plan:   Horald was seen today for medication refill.  Diagnoses and all orders for this visit:  Essential hypertension, benign Orders: -     Ambulatory referral to Neurology -     Comprehensive metabolic panel -     Lipid panel -     PSA  Excessive daytime sleepiness Orders: -     Ambulatory referral to Neurology -     Comprehensive metabolic panel -     Lipid panel -     PSA  Erectile disorder due to medical condition in male  Pain in joint, shoulder region, right Orders: -     Ambulatory referral to Orthopedic Surgery  Other orders -     losartan-hydrochlorothiazide (HYZAAR) 100-25 MG per tablet; Take 1 tablet by mouth daily. -     tadalafil (CIALIS) 20 MG tablet; Take 1 tablet (20 mg total) by mouth every other day as needed for erectile dysfunction. -     naproxen sodium (ANAPROX DS) 550 MG tablet; Take 1 tablet (550 mg total) by mouth 2 (two) times daily with a meal.   his labs are pending. He was referred for a sleep study with neurology and also an orthopedic referral for his shoulder. I am having Mr. Fahrner start on losartan-hydrochlorothiazide, tadalafil, and naproxen sodium. I am also having him maintain his naproxen sodium, traMADol, and losartan.  Meds ordered this encounter  Medications  . losartan-hydrochlorothiazide (HYZAAR) 100-25 MG per tablet    Sig: Take 1 tablet by mouth daily.    Dispense:  90 tablet    Refill:  3  . tadalafil (CIALIS) 20 MG tablet    Sig: Take 1 tablet (20 mg total) by mouth every other day as needed for erectile dysfunction.    Dispense:  10 tablet    Refill:  11  . naproxen sodium (ANAPROX DS) 550 MG tablet    Sig:  Take 1 tablet (550 mg total) by mouth 2 (two) times daily with a meal.    Dispense:  40 tablet    Refill:  0    Appropriate red flag conditions were discussed with the patient as well as actions that should be taken.  Patient expressed his understanding.  Follow-up: Return in about 6 months (around 10/21/2015).  [name], MD

## 2015-04-21 NOTE — Patient Instructions (Signed)

## 2015-04-22 LAB — PSA: PSA: 0.37 ng/mL (ref <=4.00)

## 2015-06-15 ENCOUNTER — Telehealth: Payer: Self-pay

## 2015-06-15 ENCOUNTER — Institutional Professional Consult (permissible substitution): Payer: BLUE CROSS/BLUE SHIELD | Admitting: Neurology

## 2015-06-15 NOTE — Telephone Encounter (Signed)
Pt did not show for their appt with Dr. Dohmeier today.  

## 2015-06-16 ENCOUNTER — Encounter: Payer: Self-pay | Admitting: Neurology

## 2015-08-04 ENCOUNTER — Encounter: Payer: Self-pay | Admitting: Emergency Medicine

## 2015-08-26 ENCOUNTER — Ambulatory Visit (INDEPENDENT_AMBULATORY_CARE_PROVIDER_SITE_OTHER): Payer: BLUE CROSS/BLUE SHIELD | Admitting: Family Medicine

## 2015-08-26 VITALS — BP 130/80 | HR 98 | Temp 98.6°F | Resp 18 | Ht 73.0 in | Wt 245.0 lb

## 2015-08-26 DIAGNOSIS — M79601 Pain in right arm: Secondary | ICD-10-CM

## 2015-08-26 DIAGNOSIS — R079 Chest pain, unspecified: Secondary | ICD-10-CM

## 2015-08-26 DIAGNOSIS — K641 Second degree hemorrhoids: Secondary | ICD-10-CM

## 2015-08-26 DIAGNOSIS — M79603 Pain in arm, unspecified: Secondary | ICD-10-CM

## 2015-08-26 DIAGNOSIS — M2550 Pain in unspecified joint: Secondary | ICD-10-CM

## 2015-08-26 DIAGNOSIS — K625 Hemorrhage of anus and rectum: Secondary | ICD-10-CM | POA: Diagnosis not present

## 2015-08-26 DIAGNOSIS — I10 Essential (primary) hypertension: Secondary | ICD-10-CM | POA: Diagnosis not present

## 2015-08-26 DIAGNOSIS — R202 Paresthesia of skin: Secondary | ICD-10-CM

## 2015-08-26 DIAGNOSIS — M79602 Pain in left arm: Secondary | ICD-10-CM

## 2015-08-26 LAB — COMPLETE METABOLIC PANEL WITH GFR
ALT: 64 U/L — ABNORMAL HIGH (ref 9–46)
AST: 38 U/L — ABNORMAL HIGH (ref 10–35)
Albumin: 4.2 g/dL (ref 3.6–5.1)
Alkaline Phosphatase: 40 U/L (ref 40–115)
BUN: 18 mg/dL (ref 7–25)
CO2: 26 mmol/L (ref 20–31)
Calcium: 9.2 mg/dL (ref 8.6–10.3)
Chloride: 102 mmol/L (ref 98–110)
Creat: 0.74 mg/dL (ref 0.70–1.33)
GFR, Est African American: >89 mL/min (ref >=60)
GFR, Est Non African American: >89 mL/min (ref >=60)
Glucose, Bld: 94 mg/dL (ref 65–99)
Potassium: 3.9 mmol/L (ref 3.5–5.3)
Sodium: 139 mmol/L (ref 135–146)
Total Bilirubin: 0.5 mg/dL (ref 0.2–1.2)
Total Protein: 7 g/dL (ref 6.1–8.1)

## 2015-08-26 LAB — POCT CBC
Granulocyte percent: 64.5 %G (ref 37–80)
HCT, POC: 42.4 % — AB (ref 43.5–53.7)
Hemoglobin: 14.6 g/dL (ref 14.1–18.1)
Lymph, poc: 2.5 (ref 0.6–3.4)
MCH, POC: 31 pg (ref 27–31.2)
MCHC: 34.5 g/dL (ref 31.8–35.4)
MCV: 90.1 fL (ref 80–97)
MID (cbc): 0.7 (ref 0–0.9)
MPV: 7.5 fL (ref 0–99.8)
POC Granulocyte: 5.8 (ref 2–6.9)
POC LYMPH PERCENT: 27.8 %L (ref 10–50)
POC MID %: 7.7 %M (ref 0–12)
Platelet Count, POC: 210 10*3/uL (ref 142–424)
RBC: 4.71 M/uL (ref 4.69–6.13)
RDW, POC: 13.5 %
WBC: 9 10*3/uL (ref 4.6–10.2)

## 2015-08-26 LAB — POCT URINALYSIS DIP (MANUAL ENTRY)
Bilirubin, UA: NEGATIVE
Blood, UA: NEGATIVE
Glucose, UA: NEGATIVE
Ketones, POC UA: NEGATIVE
Leukocytes, UA: NEGATIVE
Nitrite, UA: NEGATIVE
Protein Ur, POC: NEGATIVE
Spec Grav, UA: 1.015
Urobilinogen, UA: 0.2
pH, UA: 7.5

## 2015-08-26 LAB — LIPID PANEL
Cholesterol: 214 mg/dL — ABNORMAL HIGH (ref 125–200)
HDL: 36 mg/dL — ABNORMAL LOW (ref >=40)
LDL Cholesterol: 145 mg/dL — ABNORMAL HIGH (ref <130)
Total CHOL/HDL Ratio: 5.9 Ratio — ABNORMAL HIGH (ref <=5.0)
Triglycerides: 166 mg/dL — ABNORMAL HIGH (ref <150)
VLDL: 33 mg/dL — ABNORMAL HIGH (ref <30)

## 2015-08-26 LAB — POCT SEDIMENTATION RATE: POCT SED RATE: 20 mm/hr (ref 0–22)

## 2015-08-26 NOTE — Patient Instructions (Addendum)
Initial labs okay. Other tests should be back in the next day or so. Will be referring your to cardiology and gastroenterology for the other problems.

## 2015-08-26 NOTE — Progress Notes (Addendum)
Patient ID: Shane Sawyer, male   DOB: December 10, 1955, 59 y.o.   MRN: [id number]  By signing my name below, I, Shane Sawyer, attest that this documentation has been prepared under the direction and in the presence of Shane Haber, MD Electronically Signed: Ladene Sawyer, ED Scribe 08/26/2015 at 2:32 PM.  Patient ID: Shane Sawyer MRN: [id number], DOB: 1956/05/04, 59 y.o. Date of Encounter: 08/26/2015, 2:34 PM  Primary Physician: [name], MD  Chief Complaint  Patient presents with   Hypertension    On meds but its still up   Headache    Onset 3-4 days   Light headed    Onset 1 week   Feet, hands and face tingling    Onset 6 months ago   Shortness of Breath   HPI: 59 y.o. year old male with history below presents with elevated blood pressure for the past few days. Pt reports associated HA for the past 3-4 days and light-headedness for the past week that is exacerbated with walking and movement of his head. No h/o tobacco use.   Shortness of Breath Pt reports intermittent SOB with walking in a parking lot and walking up small hills. He initially noticed SOB with associated exertional chest pain that he describes as "squeezing" 5-6 months ago. Pt denies SOB at this time but reports mild chest tightness. He reports similar symptoms in the past and had a stress test done.   Extremities Pt reports worsening, intermittent burning, stabbing, numbness/tingling sensation in his feet, hands and arms for the past 6 months. He reports that pain is worse in the mornings but gradually improves as the day progresses. He reports intermittent ankle swelling for the past few weeks. Pt recently started a B complex and Vitamin C with mild relief.   Weight Pt reports h/o elevated cholesterol that he treats with fish oils and exercising. Pt states that he currently the heaviest he has ever been and plans to start going to the gym again.   Hemorrhoids  Pt reports bright red blood from rectum for the  past month. He has had similar symptoms before and reports h/o hemorrhoids. Pt's last colonoscopy was 7 years ago.  Pt's father recently passed from [place]. His father has a h/o arthritis. Pt's mother has a h/o CHF, aortic valve replacement and pacemaker placement. Pt's mother also has 6-8 siblings with aortic valve replacements as well.   Past Medical History  Diagnosis Date   HBP (high blood pressure)     Home Meds: Prior to Admission medications   Medication Sig Start Date End Date Taking? Authorizing Provider  losartan-hydrochlorothiazide (HYZAAR) 100-25 MG per tablet Take 1 tablet by mouth daily. 04/21/15  Yes [name], MD  tadalafil (CIALIS) 20 MG tablet Take 1 tablet (20 mg total) by mouth every other day as needed for erectile dysfunction. 04/21/15  Yes [name], MD  losartan (COZAAR) 100 MG tablet Take 1 tablet (100 mg total) by mouth daily. Patient not taking: Reported on 08/26/2015 04/09/15   [name], MD    Allergies:  Allergies  Allergen Reactions   Ivp Dye [Iodinated Diagnostic Agents]    Penicillins    Sulfur     Social History   Social History   Marital Status: Single    Spouse Name: N/A   Number of Children: N/A   Years of Education: 12+   Occupational History   Not on file.   Social History Main Topics   Smoking status: Never Smoker  Smokeless tobacco: Never Used   Alcohol Use: Yes     Comment: weekly    Drug Use: No   Sexual Activity: Not on file   Other Topics Concern   Not on file   Social History Narrative   Patient lives at home girlfriend [name].   Patient has no children but one the way.    Patient is currently working, he buys cars in sell them.   Patient has some college.    Review of Systems: Constitutional: negative for chills, fever, night sweats, weight changes, or fatigue  HEENT: negative for vision changes, hearing loss, congestion, rhinorrhea, ST, epistaxis, or sinus  pressure Cardiovascular: negative for palpitations, +chest pain Respiratory: negative for hemoptysis, wheezing, or cough, +shortness of breath, +chest tightness Abdominal: negative for abdominal pain, nausea, vomiting, diarrhea, or constipation Msk: arthralgias GU: +rectal bleeding Dermatological: negative for rash Neurologic: negative for dizziness, or syncope, +light-headedness, +HA, +tingling/numbness  All other systems reviewed and are otherwise negative with the exception to those above and in the HPI.  Physical Exam: Blood pressure 130/80, pulse 98, temperature 98.6 F (37 C), temperature source Oral, resp. rate 18, height 6\' 1"  (1.854 m), weight 245 lb (111.131 kg), SpO2 96 %., Body mass index is 32.33 kg/(m^2). General: Well developed, well nourished, in no acute distress. Head: Normocephalic, atraumatic, eyes without discharge, sclera non-icteric, nares are without discharge. Bilateral auditory canals clear, TM's are without perforation, pearly grey and translucent with reflective cone of light bilaterally. Oral cavity moist, posterior pharynx without exudate, erythema, peritonsillar abscess, or post nasal drip.  Neck: Supple. No thyromegaly. Full ROM. No lymphadenopathy. Lungs: Clear bilaterally to auscultation without wheezes, rales, or rhonchi. Breathing is unlabored. Heart: RRR with S1 S2. No murmurs, rubs, or gallops appreciated. Abdomen: Soft, non-tender, non-distended with normoactive bowel sounds. No hepatomegaly. No rebound/guarding. No obvious abdominal masses. rectal exam:  Grade 2 external hemorrhoids, several condyloma and the parent a.m. And on the right scrotum Msk:  Strength and tone normal for age. Extremities/Skin: Warm and dry. No clubbing or cyanosis. No edema. No rashes or suspicious lesions. Neuro: Alert and oriented X 3. Moves all extremities spontaneously. Gait is normal. CNII-XII grossly in tact. Psych:  Responds to questions appropriately with a normal  affect.   Labs: Results for orders placed or performed in visit on 08/26/15  POCT CBC  Result Value Ref Range   WBC 9.0 4.6 - 10.2 K/uL   Lymph, poc 2.5 0.6 - 3.4   POC LYMPH PERCENT 27.8 10 - 50 %L   MID (cbc) 0.7 0 - 0.9   POC MID % 7.7 0 - 12 %M   POC Granulocyte 5.8 2 - 6.9   Granulocyte percent 64.5 37 - 80 %G   RBC 4.71 4.69 - 6.13 M/uL   Hemoglobin 14.6 14.1 - 18.1 g/dL   HCT, POC 42.4 (A) 43.5 - 53.7 %   MCV 90.1 80 - 97 fL   MCH, POC 31.0 27 - 31.2 pg   MCHC 34.5 31.8 - 35.4 g/dL   RDW, POC 13.5 %   Platelet Count, POC 210 142 - 424 K/uL   MPV 7.5 0 - 99.8 fL  POCT urinalysis dipstick  Result Value Ref Range   Color, UA yellow yellow   Clarity, UA clear clear   Glucose, UA negative negative   Bilirubin, UA negative negative   Ketones, POC UA negative negative   Spec Grav, UA 1.015    Blood, UA negative negative  pH, UA 7.5    Protein Ur, POC negative negative   Urobilinogen, UA 0.2    Nitrite, UA Negative Negative   Leukocytes, UA Negative Negative     ASSESSMENT AND PLAN:  59 y.o. year old male with multiple problems. 1. Chest pain, unspecified chest pain type   2. Arthralgia   3. Paresthesia and pain of both upper extremities   4. BRBPR (bright red blood per rectum)   5. Second degree hemorrhoids   6. Essential hypertension    This chart was scribed in my presence and reviewed by me personally.    ICD-9-CM ICD-10-CM   1. Chest pain, unspecified chest pain type 786.50 R07.9 EKG 12-Lead     COMPLETE METABOLIC PANEL WITH GFR     Lipid panel     Ambulatory referral to Cardiology  2. Arthralgia 719.40 M25.50 POCT SEDIMENTATION RATE  3. Paresthesia and pain of both upper extremities 782.0 R20.2 TSH   729.5 M79.603 Vitamin B12     Vit D  25 hydroxy (rtn osteoporosis monitoring)  4. BRBPR (bright red blood per rectum) 569.3 K62.5 POCT CBC     Ambulatory referral to Gastroenterology  5. Second degree hemorrhoids 455.8 K64.1   6. Essential hypertension  401.9 I10       Signed, Shane Haber, MD 08/26/2015 2:34 PM

## 2015-08-27 LAB — VITAMIN D 25 HYDROXY (VIT D DEFICIENCY, FRACTURES): Vit D, 25-Hydroxy: 39 ng/mL (ref 30–100)

## 2015-08-27 LAB — VITAMIN B12: Vitamin B-12: 974 pg/mL — ABNORMAL HIGH (ref 211–911)

## 2015-08-27 LAB — TSH: TSH: 2.2 u[IU]/mL (ref 0.350–4.500)

## 2015-08-28 ENCOUNTER — Encounter: Payer: Self-pay | Admitting: Nurse Practitioner

## 2015-08-28 ENCOUNTER — Ambulatory Visit (INDEPENDENT_AMBULATORY_CARE_PROVIDER_SITE_OTHER): Payer: BLUE CROSS/BLUE SHIELD | Admitting: Nurse Practitioner

## 2015-08-28 VITALS — BP 152/106 | HR 96 | Ht 73.5 in | Wt 241.4 lb

## 2015-08-28 DIAGNOSIS — R9431 Abnormal electrocardiogram [ECG] [EKG]: Secondary | ICD-10-CM | POA: Diagnosis not present

## 2015-08-28 DIAGNOSIS — E785 Hyperlipidemia, unspecified: Secondary | ICD-10-CM

## 2015-08-28 DIAGNOSIS — I10 Essential (primary) hypertension: Secondary | ICD-10-CM

## 2015-08-28 DIAGNOSIS — R0789 Other chest pain: Secondary | ICD-10-CM | POA: Diagnosis not present

## 2015-08-28 MED ORDER — METOPROLOL SUCCINATE ER 50 MG PO TB24: 50.0000 mg | ORAL_TABLET | Freq: Every day | ORAL | Status: DC

## 2015-08-28 NOTE — Progress Notes (Signed)
CARDIOLOGY OFFICE NOTE  Date:  08/28/2015    Shane Sawyer Date of Birth: [birth year] Medical Record #[id number]  PCP:  [name], MD  Cardiologist:  Nahser (DOD)    Chief Complaint  Patient presents with  . Chest Pain    New patient visit - seen for Dr. Acie Fredrickson (DOD)  . Shortness of Breath    History of Present Illness: Shane Sawyer is a 59 y.o. male who presents today for a new patient visit. Referred by PCP. Has a history of HTN and ED.  Echo from 2015 with normal LV function and grade 1 diastolic dysfunction (read by Ganji).   Seen at Urgent Care earlier this week with elevated BP and lightheadedness. Also with DOE and exertional chest pain. Reports having remote stress test.   Comes in today. Here alone. He rambles about what all is going on. He has seen Dr. Einar Gip in the past. [name] to the doctor for tingling in his hands/feet/arms. BP is up. Was getting lightheaded and dizzy. Having headaches. He will have chest tightness and shortness of breath with walking - this has been going on for perhaps a year - does not happen every time. Wonders if it could be the changing seasons - he is allergic to mold and thinks that causes his chest to hurt. Multiple family members with history of AVR. No recent stress testing. He is having rectal bleeding. Has not been working out - wife got pregnant this past year - too busy with the baby and has not really done much. Does not check his blood pressure at home. He feels like he needs to get back in shape. Started some supplements - B vitamins - thinks this helps. He knows his lipids are bad - does not wish to take statin. Drinks "quite a bit of beer". LFTs are elevated. Drinks alcohol every day.   Past Medical History  Diagnosis Date  . HBP (high blood pressure)   . Erectile dysfunction     Past Surgical History  Procedure Laterality Date  . Nose surgery      times 2     Medications: Current Outpatient Prescriptions  Medication Sig  Dispense Refill  . losartan-hydrochlorothiazide (HYZAAR) 100-25 MG per tablet Take 1 tablet by mouth daily. 90 tablet 3  . tadalafil (CIALIS) 20 MG tablet Take 1 tablet (20 mg total) by mouth every other day as needed for erectile dysfunction. 10 tablet 11   No current facility-administered medications for this visit.    Allergies: Allergies  Allergen Reactions  . Ivp Dye [Iodinated Diagnostic Agents]   . Penicillins   . Sulfur     Social History: The patient  reports that he has never smoked. He has never used smokeless tobacco. He reports that he drinks alcohol. He reports that he does not use illicit drugs.   Family History: The patient's family history includes Cancer in his father; Heart failure in his mother; Valvular heart disease in his mother. Mother has a history of CHF, AVR and has PPM in place. His mother has 6 to 8 siblings with history of AVR as well.   Review of Systems: Please see the history of present illness.   Otherwise, the review of systems is positive for none.   All other systems are reviewed and negative.   Physical Exam: VS:  BP 152/106 mmHg  Pulse 96  Ht 6' 1.5" (1.867 m)  Wt 241 lb 6.4 oz (109.498 kg)  BMI 31.41 kg/m2  SpO2 97% .  BMI Body mass index is 31.41 kg/(m^2).  Wt Readings from Last 3 Encounters:  08/28/15 241 lb 6.4 oz (109.498 kg)  08/26/15 245 lb (111.131 kg)  04/21/15 239 lb (108.41 kg)    General: Alert. He is of large stature but in no acute distress.  HEENT: Normal.Face is flushed.  Neck: Supple, no JVD, carotid bruits, or masses noted.  Cardiac: Regular rate and rhythm. +S4. No edema.  Respiratory:  Lungs are clear to auscultation bilaterally with normal work of breathing.  GI: Soft and nontender.  MS: No deformity or atrophy. Gait and ROM intact. Skin: Warm and dry. Color is normal.  Neuro:  Strength and sensation are intact and no gross focal deficits noted.  Psych: Alert, appropriate and with normal affect.   LABORATORY  DATA:  EKG:  EKG is not ordered today. EKG from 2 days ago with NSR. Inferior ST/T wave flattening.     Lab Results  Component Value Date   WBC 9.0 08/26/2015   HGB 14.6 08/26/2015   HCT 42.4* 08/26/2015   GLUCOSE 94 08/26/2015   CHOL 214* 08/26/2015   TRIG 166* 08/26/2015   HDL 36* 08/26/2015   LDLCALC 145* 08/26/2015   ALT 64* 08/26/2015   AST 38* 08/26/2015   NA 139 08/26/2015   K 3.9 08/26/2015   CL 102 08/26/2015   CREATININE 0.74 08/26/2015   BUN 18 08/26/2015   CO2 26 08/26/2015   TSH 2.200 08/26/2015   PSA 0.37 04/21/2015    BNP (last 3 results) No results for input(s): BNP in the last 8760 hours.  ProBNP (last 3 results) No results for input(s): PROBNP in the last 8760 hours.   Other Studies Reviewed Today:   Assessment/Plan: 1. Chest pain/shortness of breath - long standing history - exertional. Has multiple risk factors - will arrange for stress Myoview and echocardiogram.   2. Rectal bleeding - seeing GI in 3 weeks - most likely from hemorrhoids. Would hold on aspirin for now.   3. HTN - uncontrolled - on ARB/diuretic. Recheck by me is 150/100. Will add Toprol XL 50 mg a day - hopefully he will tolerate  4. Daily alcohol use - LFTs up some - would recommend total abstinence  5. ED - on Cialis  6. Obesity - using supplements to lose weight - would not advise that - needs to work on cutting back calories and getting back to exercise routine if stress study is ok.   Further disposition to follow.   Current medicines are reviewed with the patient today.  The patient does not have concerns regarding medicines other than what has been noted above.  The following changes have been made:  See above.  Labs/ tests ordered today include:   No orders of the defined types were placed in this encounter.     Disposition:   Further disposition to follow. I will see him back in 4 weeks for recheck.   Patient is agreeable to this plan and will call if any  problems develop in the interim.   Signed: Burtis Junes, RN, ANP-C 08/28/2015 2:38 PM  [facility] [address] [place], [place]  [address] Phone: [phone] Fax: [phone]

## 2015-08-28 NOTE — Patient Instructions (Addendum)
We will be checking the following labs today - NONE   Medication Instructions:    Continue with your current medicines. I am   Adding Toprol XL 50 mg a day - this is for your blood pressure and elevated HR - this has been sent to your drug store    Testing/Procedures To Be Arranged:  Echocardiogram  Stress Myoview  Follow-Up:   See me in one month for BP recheck.     Other Special Instructions:   N/A    If you need a refill on your cardiac medications before your next appointment, please call your pharmacy.   Call the [place] office at [phone] if you have any questions, problems or concerns.

## 2015-08-31 ENCOUNTER — Ambulatory Visit (HOSPITAL_COMMUNITY): Payer: BLUE CROSS/BLUE SHIELD | Attending: Internal Medicine

## 2015-08-31 DIAGNOSIS — E785 Hyperlipidemia, unspecified: Secondary | ICD-10-CM | POA: Diagnosis not present

## 2015-08-31 DIAGNOSIS — R0609 Other forms of dyspnea: Secondary | ICD-10-CM | POA: Insufficient documentation

## 2015-08-31 DIAGNOSIS — R002 Palpitations: Secondary | ICD-10-CM | POA: Diagnosis not present

## 2015-08-31 DIAGNOSIS — R0789 Other chest pain: Secondary | ICD-10-CM | POA: Diagnosis not present

## 2015-08-31 DIAGNOSIS — R42 Dizziness and giddiness: Secondary | ICD-10-CM | POA: Insufficient documentation

## 2015-08-31 DIAGNOSIS — I10 Essential (primary) hypertension: Secondary | ICD-10-CM | POA: Diagnosis not present

## 2015-08-31 DIAGNOSIS — R9431 Abnormal electrocardiogram [ECG] [EKG]: Secondary | ICD-10-CM

## 2015-08-31 LAB — MYOCARDIAL PERFUSION IMAGING
Estimated workload: 10.4 METS
Exercise duration (min): 11 min
Exercise duration (sec): 30 s
LV dias vol: 116 mL
LV sys vol: 47 mL
MPHR: 161 {beats}/min
Peak HR: 116 {beats}/min
Percent HR: 72 %
RATE: 0.3
RPE: 18
Rest HR: 68 {beats}/min
SDS: 1
SRS: 1
SSS: 2
TID: 0.83

## 2015-08-31 MED ORDER — TECHNETIUM TC 99M SESTAMIBI GENERIC - CARDIOLITE
10.7000 | Freq: Once | INTRAVENOUS | Status: AC | PRN
  Administered 2015-08-31: 11 via INTRAVENOUS

## 2015-08-31 MED ORDER — TECHNETIUM TC 99M SESTAMIBI GENERIC - CARDIOLITE
32.7000 | Freq: Once | INTRAVENOUS | Status: AC | PRN
  Administered 2015-08-31: 32.7 via INTRAVENOUS

## 2015-08-31 MED ORDER — REGADENOSON 0.4 MG/5ML IV SOLN
0.4000 mg | Freq: Once | INTRAVENOUS | Status: AC
  Administered 2015-08-31: 0.4 mg via INTRAVENOUS

## 2015-09-07 ENCOUNTER — Ambulatory Visit (HOSPITAL_COMMUNITY): Payer: BLUE CROSS/BLUE SHIELD | Attending: Cardiology

## 2015-09-07 ENCOUNTER — Other Ambulatory Visit: Payer: Self-pay

## 2015-09-07 ENCOUNTER — Other Ambulatory Visit: Payer: Self-pay | Admitting: Nurse Practitioner

## 2015-09-07 DIAGNOSIS — I5189 Other ill-defined heart diseases: Secondary | ICD-10-CM | POA: Insufficient documentation

## 2015-09-07 DIAGNOSIS — E785 Hyperlipidemia, unspecified: Secondary | ICD-10-CM

## 2015-09-07 DIAGNOSIS — I351 Nonrheumatic aortic (valve) insufficiency: Secondary | ICD-10-CM | POA: Insufficient documentation

## 2015-09-07 DIAGNOSIS — R079 Chest pain, unspecified: Secondary | ICD-10-CM | POA: Diagnosis present

## 2015-09-07 DIAGNOSIS — R0789 Other chest pain: Secondary | ICD-10-CM

## 2015-09-07 DIAGNOSIS — I059 Rheumatic mitral valve disease, unspecified: Secondary | ICD-10-CM | POA: Diagnosis not present

## 2015-09-07 DIAGNOSIS — I10 Essential (primary) hypertension: Secondary | ICD-10-CM

## 2015-09-07 DIAGNOSIS — R9431 Abnormal electrocardiogram [ECG] [EKG]: Secondary | ICD-10-CM

## 2015-09-08 ENCOUNTER — Telehealth: Payer: Self-pay | Admitting: Nurse Practitioner

## 2015-09-08 NOTE — Telephone Encounter (Signed)
F/u  Pt returning RN concerning echo results.

## 2015-09-21 ENCOUNTER — Encounter: Payer: Self-pay | Admitting: Gastroenterology

## 2015-09-21 ENCOUNTER — Ambulatory Visit (INDEPENDENT_AMBULATORY_CARE_PROVIDER_SITE_OTHER): Payer: BLUE CROSS/BLUE SHIELD | Admitting: Gastroenterology

## 2015-09-21 VITALS — BP 140/60 | HR 84 | Ht 73.0 in | Wt 241.2 lb

## 2015-09-21 DIAGNOSIS — K625 Hemorrhage of anus and rectum: Secondary | ICD-10-CM | POA: Diagnosis not present

## 2015-09-21 DIAGNOSIS — Z1211 Encounter for screening for malignant neoplasm of colon: Secondary | ICD-10-CM | POA: Diagnosis not present

## 2015-09-21 DIAGNOSIS — R194 Change in bowel habit: Secondary | ICD-10-CM

## 2015-09-21 MED ORDER — NA SULFATE-K SULFATE-MG SULF 17.5-3.13-1.6 GM/177ML PO SOLN: 1.0000 | ORAL | Status: AC

## 2015-09-21 NOTE — Progress Notes (Signed)
     09/21/2015 Shane Sawyer [id number] 11/29/1955   HISTORY OF PRESENT ILLNESS:  This is a 59 year old male who is new to our practice and comes in today at the request of his PCP, Dr. Everlene Farrier, for evaluation regarding rectal bleeding.  Had a normal colonoscopy by report in [place] 9 years ago.  We do not have records.  Says that he's had hemorrhoids for several years that have bled on and off.  Recently, however, he's had more bleeding; says that it was bleeding some on a daily basis for over a month but now no bleeding for the past several days.  Described as bright red blood on TP but in larger amounts than previous; also hears it dripping in the toilet at times recently as well.  Also says that his bowels have changed.  Used to have a BM every day, but now does not have them as often and sometimes has to strain.  Recent CBC showed normal Hgb at 14.6 grams.  TSH normal as well.    Past Medical History  Diagnosis Date  . HBP (high blood pressure)   . Erectile dysfunction    Past Surgical History  Procedure Laterality Date  . Nose surgery      times 2    reports that he has never smoked. He has never used smokeless tobacco. He reports that he drinks alcohol. He reports that he does not use illicit drugs. family history includes Bladder Cancer in his father; Heart failure in his mother; Valvular heart disease in his mother. There is no history of Colon cancer. Allergies  Allergen Reactions  . Ivp Dye [Iodinated Diagnostic Agents]     Swelling in throat, SOB  . Penicillins   . Sulfur       Outpatient Encounter Prescriptions as of 09/21/2015  Medication Sig  . losartan-hydrochlorothiazide (HYZAAR) 100-25 MG per tablet Take 1 tablet by mouth daily.  . metoprolol succinate (TOPROL-XL) 50 MG 24 hr tablet Take 1 tablet (50 mg total) by mouth daily. Take with or immediately following a meal.  . [DISCONTINUED] tadalafil (CIALIS) 20 MG tablet Take 1 tablet (20 mg total) by mouth every  other day as needed for erectile dysfunction.   No facility-administered encounter medications on file as of 09/21/2015.     REVIEW OF SYSTEMS  : All other systems reviewed and negative except where noted in the History of Present Illness.   PHYSICAL EXAM: BP 140/60 mmHg  Pulse 84  Ht 6\' 1"  (1.854 m)  Wt 241 lb 3.2 oz (109.408 kg)  BMI 31.83 kg/m2 General: Well developed white male in no acute distress Head: Normocephalic and atraumatic Eyes:  Sclerae anicteric, conjunctiva pink. Ears: Normal auditory acuity Lungs: Clear throughout to auscultation Heart: Regular rate and rhythm Abdomen: Soft, non-distended.  Normal bowel sounds.  Non-tender. Rectal:  Will be done at the time of colonoscopy. Musculoskeletal: Symmetrical with no gross deformities  Skin: No lesions on visible extremities Extremities: No edema  Neurological: Alert oriented x 4, grossly non-focal Psychological:  Alert and cooperative. Normal mood and affect  ASSESSMENT AND PLAN: -Rectal bleeding:  Likely hemorrhoidal as it has been occurring intermittently for years, but more heavily and more frequent recently.  Will schedule for colonoscopy since last was 9 years ago.  I offered hydrocortisone suppositories to use, however, he declined and would like more definitive treatment of hemorrhoids post colonoscopy exam.  CC:  Daub, Loura Back, MD

## 2015-09-21 NOTE — Progress Notes (Signed)
Reviewed and agree with management plan.  Keithon Mccoin T. Biff Rutigliano, MD FACG 

## 2015-09-21 NOTE — Patient Instructions (Signed)

## 2015-10-05 ENCOUNTER — Ambulatory Visit: Payer: BLUE CROSS/BLUE SHIELD | Admitting: Nurse Practitioner

## 2015-10-05 DIAGNOSIS — R0989 Other specified symptoms and signs involving the circulatory and respiratory systems: Secondary | ICD-10-CM

## 2015-10-07 ENCOUNTER — Encounter: Payer: Self-pay | Admitting: Nurse Practitioner

## 2015-10-21 ENCOUNTER — Telehealth: Payer: Self-pay

## 2015-10-21 NOTE — Telephone Encounter (Signed)
Waiting on payment of $41.75 for 72 pages from [employer]

## 2015-10-22 NOTE — Telephone Encounter (Signed)
Payment received via fax. I have the records that were requested.

## 2015-10-23 DIAGNOSIS — Z0271 Encounter for disability determination: Secondary | ICD-10-CM

## 2015-11-09 ENCOUNTER — Encounter: Payer: BLUE CROSS/BLUE SHIELD | Admitting: Gastroenterology

## 2015-12-07 ENCOUNTER — Encounter: Payer: BLUE CROSS/BLUE SHIELD | Admitting: Gastroenterology

## 2016-01-01 ENCOUNTER — Ambulatory Visit (INDEPENDENT_AMBULATORY_CARE_PROVIDER_SITE_OTHER): Payer: BLUE CROSS/BLUE SHIELD | Admitting: Urgent Care

## 2016-01-01 VITALS — BP 130/78 | HR 75 | Temp 98.2°F | Resp 16 | Ht 73.0 in | Wt 241.0 lb

## 2016-01-01 DIAGNOSIS — R0789 Other chest pain: Secondary | ICD-10-CM | POA: Diagnosis not present

## 2016-01-01 DIAGNOSIS — Z8719 Personal history of other diseases of the digestive system: Secondary | ICD-10-CM | POA: Diagnosis not present

## 2016-01-01 DIAGNOSIS — R1013 Epigastric pain: Secondary | ICD-10-CM

## 2016-01-01 MED ORDER — GI COCKTAIL ~~LOC~~
30.0000 mL | Freq: Once | ORAL | Status: AC
  Administered 2016-01-01: 30 mL via ORAL

## 2016-01-01 NOTE — Progress Notes (Signed)
    MRN: [id number] DOB: [birth year]  Subjective:   Shane Sawyer is a 60 y.o. male presenting for chief complaint of GERD.   Reports ~1 day history of indigestion associated with sternal chest pain that goes up to his neck. Also has dry cough, epigastric burning sensation and burping yesterday. Of note, patient states that he ate salmon at a restaurant yesterday and his symptoms started after that and have persisted into today. Has tried Pepto, started Prilosec and has taken 3 doses. Denies shob, wheezing, n/v, abdominal pain, diaphoresis, dizziness, heart racing. Denies smoking cigarettes. Has occasional alcohol drink. Of note, patient had complete cardiac work up in 10/2015 including echo, stress test and was normal.  Shane Sawyer has a current medication list which includes the following prescription(s): AMBULATORY NON FORMULARY MEDICATION, AMBULATORY NON FORMULARY MEDICATION, AMBULATORY NON FORMULARY MEDICATION, apple cider vinegar, cod liver oil, coenzyme [id number], folic acid, garlic, losartan-hydrochlorothiazide, metoprolol succinate, milk thistle, niacin, omega-3 fatty acids, plant sterols and stanols, and thiamine. Also is allergic to ivp dye; penicillins; and sulfur.  Shane Sawyer  has a past medical history of HBP (high blood pressure) and Erectile dysfunction. Also  has past surgical history that includes Nose surgery.  Objective:   Vitals: BP 130/78 mmHg  Pulse 75  Temp(Src) 98.2 F (36.8 C) (Oral)  Resp 16  Ht 6\' 1"  (1.854 m)  Wt 241 lb (109.317 kg)  BMI 31.80 kg/m2  SpO2 97%  Physical Exam  Constitutional: He is oriented to person, place, and time. He appears well-developed and well-nourished.  HENT:  Mouth/Throat: Oropharynx is clear and moist.  Eyes: Right eye exhibits no discharge. Left eye exhibits no discharge. No scleral icterus.  Neck: Normal range of motion. Neck supple.  Cardiovascular: Normal rate, regular rhythm and intact distal pulses.  Exam reveals no gallop and no friction  rub.   No murmur heard. Pulmonary/Chest: No respiratory distress. He has no wheezes. He has no rales.  Abdominal: Soft. Bowel sounds are normal. He exhibits no distension and no mass. There is tenderness (mild epigastric).  Musculoskeletal: He exhibits no edema.  Neurological: He is alert and oriented to person, place, and time.  Skin: Skin is warm and dry.   ECG interpretation - q-wave in V1-V2, otherwise sinus rhythm.  Assessment and Plan :   1. Atypical chest pain 2. Abdominal pain, epigastric 3. History of gastroesophageal reflux (GERD) - Likely undergoing GERD. Patient notes improvement with GI cocktail. I advised he try Pepcid. He has an appointment on 01/04/2016 for a colonoscopy. Counseled patient on GERD causing foods. Patient is to rtc if symptom fail to improve.  Jaynee Eagles, PA-C [facility] [phone] 01/01/2016 5:25 PM

## 2016-01-01 NOTE — Patient Instructions (Signed)
Food Choices for Gastroesophageal Reflux Disease, Adult When you have gastroesophageal reflux disease (GERD), the foods you eat and your eating habits are very important. Choosing the right foods can help ease the discomfort of GERD. WHAT GENERAL GUIDELINES DO I NEED TO FOLLOW?  Choose fruits, vegetables, whole grains, low-fat dairy products, and low-fat meat, fish, and poultry.  Limit fats such as oils, salad dressings, butter, nuts, and avocado.  Keep a food diary to identify foods that cause symptoms.  Avoid foods that cause reflux. These may be different for different people.  Eat frequent small meals instead of three large meals each day.  Eat your meals slowly, in a relaxed setting.  Limit fried foods.  Cook foods using methods other than frying.  Avoid drinking alcohol.  Avoid drinking large amounts of liquids with your meals.  Avoid bending over or lying down until 2-3 hours after eating. WHAT FOODS ARE NOT RECOMMENDED? The following are some foods and drinks that may worsen your symptoms: Vegetables Tomatoes. Tomato juice. Tomato and spaghetti sauce. Chili peppers. Onion and garlic. Horseradish. Fruits Oranges, grapefruit, and lemon (fruit and juice). Meats High-fat meats, fish, and poultry. This includes hot dogs, ribs, ham, sausage, salami, and bacon. Dairy Whole milk and chocolate milk. Sour cream. Cream. Butter. Ice cream. Cream cheese.  Beverages Coffee and tea, with or without caffeine. Carbonated beverages or energy drinks. Condiments Hot sauce. Barbecue sauce.  Sweets/Desserts Chocolate and cocoa. Donuts. Peppermint and spearmint. Fats and Oils High-fat foods, including French fries and potato chips. Other Vinegar. Strong spices, such as black pepper, white pepper, red pepper, cayenne, curry powder, cloves, ginger, and chili powder. The items listed above may not be a complete list of foods and beverages to avoid. Contact your dietitian for more  information.   This information is not intended to replace advice given to you by your health care provider. Make sure you discuss any questions you have with your health care provider.   Document Released: 10/17/2005 Document Revised: 11/07/2014 Document Reviewed: 08/21/2013 Elsevier Interactive Patient Education 2016 Elsevier Inc.  

## 2016-01-04 ENCOUNTER — Encounter: Payer: Self-pay | Admitting: Gastroenterology

## 2016-01-04 ENCOUNTER — Ambulatory Visit (AMBULATORY_SURGERY_CENTER): Payer: BLUE CROSS/BLUE SHIELD | Admitting: Gastroenterology

## 2016-01-04 VITALS — BP 111/66 | HR 63 | Temp 97.3°F | Resp 13

## 2016-01-04 DIAGNOSIS — D122 Benign neoplasm of ascending colon: Secondary | ICD-10-CM

## 2016-01-04 DIAGNOSIS — D124 Benign neoplasm of descending colon: Secondary | ICD-10-CM

## 2016-01-04 DIAGNOSIS — Z1211 Encounter for screening for malignant neoplasm of colon: Secondary | ICD-10-CM

## 2016-01-04 MED ORDER — SODIUM CHLORIDE 0.9 % IV SOLN: 500.0000 mL | INTRAVENOUS | Status: DC

## 2016-01-04 NOTE — Op Note (Signed)
[facility]  [address] [place], [id number]   COLONOSCOPY PROCEDURE REPORT  PATIENT: Shane Sawyer, Shane Sawyer  MR#: [id number] BIRTHDATE: 1956-04-22 , 71  yrs. old GENDER: male ENDOSCOPIST: Ladene Artist, MD, [facility] REFERRED BY:  Arlyss Queen, M.D. PROCEDURE DATE:  01/04/2016 PROCEDURE:   Colonoscopy, screening and Colonoscopy with snare polypectomy First Screening Colonoscopy - Avg.  risk and is 50 yrs.  old or older - No.  Prior Negative Screening - Now for repeat screening. 10 or more years since last screening  History of Adenoma - Now for follow-up colonoscopy & has been > or = to 3 yrs.  N/A  Polyps removed today? Yes ASA CLASS:   Class II INDICATIONS:Screening for colonic neoplasia and Colorectal Neoplasm Risk Assessment for this procedure is average risk. MEDICATIONS: Monitored anesthesia care and Propofol 240 mg IV  DESCRIPTION OF PROCEDURE:   After the risks benefits and alternatives of the procedure were thoroughly explained, informed consent was obtained.  The digital rectal exam revealed no abnormalities of the rectum.   The LB [id number]  endoscope was introduced through the anus and advanced to the cecum, which was identified by both the appendix and ileocecal valve. No adverse events experienced.   The quality of the prep was adequate (Suprep was used) after extensive rinsing, suctioning. The instrument was then slowly withdrawn as the colon was fully examined. Estimated blood loss is zero unless otherwise noted in this procedure report.   COLON FINDINGS: Two sessile polyps measuring 6-7 mm in size were found in the descending colon and ascending colon.  Polypectomies were performed with a cold snare.  The resection was complete, the polyp tissue was completely retrieved and sent to histology.   The colonic mucosa appeared normal at the splenic flexure, in the sigmoid colon, rectum, transverse colon, at the cecum, ileocecal valve, and hepatic  flexure.  Retroflexed views revealed internal Grade I hemorrhoids and Retroflexed views revealed external hemorrhoids. The time to cecum = 1.9 Withdrawal time = 12.7   The scope was withdrawn and the procedure completed. COMPLICATIONS: There were no immediate complications.  ENDOSCOPIC IMPRESSION: 1.   Two sessile polyps in the descending colon and ascending colon; polypectomies performed with a cold snare 2.   Grade I internal hemorrhoidand external hemorrhoids  RECOMMENDATIONS: 1.  Await pathology results 2.  Repeat colonoscopy in 5 years if polyp(s) adenomatous; otherwise 10 years with a more extensive bowel prep  eSigned:  Ladene Artist, MD, [facility] 01/04/2016 2:54 PM

## 2016-01-04 NOTE — Patient Instructions (Signed)
Colon polyps x 2 removed. Handouts given on polyps and hemorrhoids.  Result letter will be mailed to you in about 3 weeks. Thank you!  YOU HAD AN ENDOSCOPIC PROCEDURE TODAY AT [facility]:   Refer to the procedure report that was given to you for any specific questions about what was found during the examination.  If the procedure report does not answer your questions, please call your gastroenterologist to clarify.  If you requested that your care partner not be given the details of your procedure findings, then the procedure report has been included in a sealed envelope for you to review at your convenience later.  YOU SHOULD EXPECT: Some feelings of bloating in the abdomen. Passage of more gas than usual.  Walking can help get rid of the air that was put into your GI tract during the procedure and reduce the bloating. If you had a lower endoscopy (such as a colonoscopy or flexible sigmoidoscopy) you may notice spotting of blood in your stool or on the toilet paper. If you underwent a bowel prep for your procedure, you may not have a normal bowel movement for a few days.  Please Note:  You might notice some irritation and congestion in your nose or some drainage.  This is from the oxygen used during your procedure.  There is no need for concern and it should clear up in a day or so.  SYMPTOMS TO REPORT IMMEDIATELY:   Following lower endoscopy (colonoscopy or flexible sigmoidoscopy):  Excessive amounts of blood in the stool  Significant tenderness or worsening of abdominal pains  Swelling of the abdomen that is new, acute  Fever of 100F or higher  For urgent or emergent issues, a gastroenterologist can be reached at any hour by calling [phone].   DIET: Your first meal following the procedure should be a small meal and then it is ok to progress to your normal diet. Heavy or fried foods are harder to digest and may make you feel nauseous or bloated.  Likewise, meals heavy  in dairy and vegetables can increase bloating.  Drink plenty of fluids but you should avoid alcoholic beverages for 24 hours.  ACTIVITY:  You should plan to take it easy for the rest of today and you should NOT DRIVE or use heavy machinery until tomorrow (because of the sedation medicines used during the test).    FOLLOW UP: Our staff will call the number listed on your records the next business day following your procedure to check on you and address any questions or concerns that you may have regarding the information given to you following your procedure. If we do not reach you, we will leave a message.  However, if you are feeling well and you are not experiencing any problems, there is no need to return our call.  We will assume that you have returned to your regular daily activities without incident.  If any biopsies were taken you will be contacted by phone or by letter within the next 1-3 weeks.  Please call us at [phone] if you have not heard about the biopsies in 3 weeks.    SIGNATURES/CONFIDENTIALITY: You and/or your care partner have signed paperwork which will be entered into your electronic medical record.  These signatures attest to the fact that that the information above on your After Visit Summary has been reviewed and is understood.  Full responsibility of the confidentiality of this discharge information lies with you and/or your care-partner.

## 2016-01-04 NOTE — Progress Notes (Signed)
Called to room to assist during endoscopic procedure.  Patient ID and intended procedure confirmed with present staff. Received instructions for my participation in the procedure from the performing physician.  

## 2016-01-04 NOTE — Progress Notes (Signed)
Report to PACU, RN, vss, BBS= Clear.  

## 2016-01-05 ENCOUNTER — Telehealth: Payer: Self-pay

## 2016-01-05 NOTE — Telephone Encounter (Signed)
  Follow up Call-  Call back number 01/04/2016  Post procedure Call Back phone  # [phone]  Permission to leave phone message Yes     Patient questions:  Do you have a fever, pain , or abdominal swelling? No. Pain Score  0 *  Have you tolerated food without any problems? Yes.    Have you been able to return to your normal activities? Yes.    Do you have any questions about your discharge instructions: Diet   No. Medications  No. Follow up visit  No.  Do you have questions or concerns about your Care? No.  Actions: * If pain score is 4 or above: No action needed, pain <4.

## 2016-01-06 ENCOUNTER — Telehealth: Payer: Self-pay | Admitting: Emergency Medicine

## 2016-01-06 NOTE — Telephone Encounter (Signed)
Pt states he was trying to get a life ins. Policy but [employer] states that he declined a Sleep Study; which is an impact to his policy rate. PT request a call back to discuss issue  [phone] please call

## 2016-01-07 NOTE — Telephone Encounter (Signed)
Please call patient. Insurance companies are very specific about wanting sleep studies. He will qualify for life insurance if he does have sleep apnea and gets treated. I cannot change that an order was placed in the system. Once there is a note in the chart it cannot be removed.

## 2016-01-08 NOTE — Telephone Encounter (Signed)
Spoke with pt, advised message from Dr. Everlene Farrier. He would like to set up a sleep study.

## 2016-01-09 ENCOUNTER — Other Ambulatory Visit: Payer: Self-pay | Admitting: Emergency Medicine

## 2016-01-09 DIAGNOSIS — R5383 Other fatigue: Secondary | ICD-10-CM

## 2016-01-13 ENCOUNTER — Ambulatory Visit (INDEPENDENT_AMBULATORY_CARE_PROVIDER_SITE_OTHER): Payer: BLUE CROSS/BLUE SHIELD | Admitting: Neurology

## 2016-01-13 ENCOUNTER — Encounter: Payer: Self-pay | Admitting: Neurology

## 2016-01-13 VITALS — BP 106/78 | HR 80 | Resp 20 | Ht 73.0 in | Wt 239.0 lb

## 2016-01-13 DIAGNOSIS — R5383 Other fatigue: Secondary | ICD-10-CM | POA: Diagnosis not present

## 2016-01-13 DIAGNOSIS — R0683 Snoring: Secondary | ICD-10-CM | POA: Diagnosis not present

## 2016-01-13 DIAGNOSIS — Z72821 Inadequate sleep hygiene: Secondary | ICD-10-CM | POA: Diagnosis not present

## 2016-01-13 DIAGNOSIS — R0689 Other abnormalities of breathing: Secondary | ICD-10-CM | POA: Diagnosis not present

## 2016-01-13 DIAGNOSIS — G479 Sleep disorder, unspecified: Secondary | ICD-10-CM | POA: Diagnosis not present

## 2016-01-13 DIAGNOSIS — Z72820 Sleep deprivation: Secondary | ICD-10-CM

## 2016-01-13 DIAGNOSIS — R351 Nocturia: Secondary | ICD-10-CM | POA: Diagnosis not present

## 2016-01-13 NOTE — Progress Notes (Signed)
SLEEP MEDICINE CLINIC   Provider:  Larey Seat, M D  Referring Provider: Darlyne Russian, MD Primary Care Physician:  [name], MD  Chief Complaint  Patient presents with  . New Patient (Initial Visit)    tired during the day, rm 11, alone    HPI:  Shane Sawyer is a 60 y.o. male , seen here as a referral from Dr. Everlene Farrier for a sleep evaluation,   Chief complaint according to patient : " I am just more tired "    Shane Sawyer is a 60 year old married Caucasian gentleman right-handed who presents today with a chief complaint of daytime tiredness rather than sleepiness. He states that he does not have any kind of chest pain diaphoresis, he has not felt palpitations and he has not been wheezing. He is a complete cardiac workup that was negative in December 2016 he also was evaluated by Dr. Everlene Farrier, this time for gastroesophageal reflux. Was doing this last visit with Shane Sawyer mentioned that he felt more tired and this led to his referral here. He also stated that he was obtaining a life insurance policy and as long as the suspicion of sleep apnea was part of his medical records the policy price would be much increased.   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 He has weeks during which she will work 12 hours a day and others when he is off. This time to return home after work also varies greatly depending on his variable work schedule.   Sleep habits are as follows: Shane Sawyer states that he has no trouble going to sleep and in some situations may sleep when not indicated. In usually his rise time on Monday  is about 8 AM. On days was a scheduled auction he will wake up as early as 5 AM. Wednesday at 6 AM. He will go to bed between midnight and one AM, and drives again 7 hours after auction.    Some of the auctions take place out of state. This causes fatigue.  Some nights have 7 and other nights 5 hours of sleep. He takes 20-30 minutes power naps in daytime.  Week ends he  is up at 8. His daughter is 60 years old. His wife is staying home.  His bedtime varies equally great.  He noted that his young child is not waking him up.  he has about 2 nocturia is nonetheless sometimes more, his wife has noted him to snore. She has not witnessed apneas. She heart him gasp only after alcohol was  consumed.  He feels usually at 8 AM  he is usually feeling still as if he could sleep a little longer but he does not wake up with headaches usually he has no palpitations again no nausea. No headaches in the morning . He falls asleep in front of the TV,  Sleeps on 2 pillows, cool and quiet and dark. Fan in background.    Hstory and family sleep history:  Mother has OSA and is on CPAP.  Social history:  Married, father of a toddler.   Review of Systems: Out of a complete 14 system review, the patient complains of only the following symptoms, and all other reviewed systems are negative.   Epworth score 5, Fatigue severity score 35  , depression score n/a .   Social History   Social History  . Marital Status: Married    Spouse Name: N/A  . Number of Children: 1  .  Years of Education: 12+   Occupational History  . Not on file.   Social History Main Topics  . Smoking status: Never Smoker   . Smokeless tobacco: Never Used  . Alcohol Use: 1.2 oz/week    2 Cans of beer per week     Comment: weekly   . Drug Use: No  . Sexual Activity: Not on file   Other Topics Concern  . Not on file   Social History Narrative   Patient lives at home girlfriend [name].   Patient has no children but one the way.    Patient is currently working, he buys cars in sell them.   Patient has some college.    Family History  Problem Relation Age of Onset  . Bladder Cancer Father   . Heart failure Mother   . Valvular heart disease Mother   . Colon cancer Neg Hx     Past Medical History  Diagnosis Date  . HBP (high blood pressure)   . Erectile dysfunction     Past  Surgical History  Procedure Laterality Date  . Nose surgery      times 2    Current Outpatient Prescriptions  Medication Sig Dispense Refill  . AMBULATORY NON FORMULARY MEDICATION Liquid silver nitrite  1 dose by mouth daily    . AMBULATORY NON FORMULARY MEDICATION Emergency B complex Takes one tablet daily    . AMBULATORY NON FORMULARY MEDICATION L-arginine Take one tablet daily    . APPLE CIDER VINEGAR PO Take 1 Dose by mouth daily.    . COD LIVER OIL PO Take 1 Dose by mouth daily.    . Coenzyme Q10 (CO Q-10 PO) Take 1 tablet by mouth daily.    Marland Kitchen FOLIC ACID PO Take 1 tablet by mouth daily.    Marland Kitchen GARLIC PO Take 1 tablet by mouth daily.    Marland Kitchen losartan-hydrochlorothiazide (HYZAAR) 100-25 MG per tablet Take 1 tablet by mouth daily. 90 tablet 3  . metoprolol succinate (TOPROL-XL) 50 MG 24 hr tablet Take 1 tablet (50 mg total) by mouth daily. Take with or immediately following a meal. 30 tablet 6  . MILK THISTLE PO Take 1 tablet by mouth daily.    . Niacin (VITAMIN B-3 PO) Take 1 tablet by mouth daily.    . Omega-3 Fatty Acids (FISH OIL PO) Take 1 tablet by mouth daily.    Marland Kitchen omeprazole (PRILOSEC) 20 MG capsule Take 20 mg by mouth daily.    . Plant Sterols and Stanols (CHOLEST OFF PO) Take 1 tablet by mouth daily.    . tadalafil (CIALIS) 20 MG tablet Take 20 mg by mouth daily as needed for erectile dysfunction.    . thiamine (VITAMIN B-1) 100 MG tablet Take 100 mg by mouth daily.     No current facility-administered medications for this visit.    Allergies as of 01/13/2016 - Review Complete 01/13/2016  Allergen Reaction Noted  . Ivp dye [iodinated diagnostic agents]  08/29/2013  . Penicillins  08/29/2013  . Sulfur  08/29/2013    Vitals: BP 106/78 mmHg  Pulse 80  Resp 20  Ht 6\' 1"  (1.854 m)  Wt 239 lb (108.41 kg)  BMI 31.54 kg/m2 Last Weight:  Wt Readings from Last 1 Encounters:  01/13/16 239 lb (108.41 kg)   [id number] mass index is 31.54 kg/(m^2).     Last Height:   Ht  Readings from Last 1 Encounters:  01/13/16 6\' 1"  (1.854 m)    Physical  exam:  General: The patient is awake, alert and appears not in acute distress. The patient is well groomed. Head: Normocephalic, atraumatic. Neck is supple. Mallampati 2,  neck circumference:18.5 Nasal airflow restricted ,  Retrognathia is not seen.  Cardiovascular:  Regular rate and rhythm , without  murmurs or carotid bruit, and without distended neck veins. Respiratory: Lungs are clear to auscultation. Skin:  Without evidence of edema, or rash Trunk: BMI is elevated  Neurologic exam : The patient is awake and alert, oriented to place and time.   Memory subjective  described as intact.  Attention span & concentration ability appears normal.  Speech is fluent,  without dysarthria, dysphonia or aphasia.  Mood and affect are appropriate.  Cranial nerves: Pupils are equal and briskly reactive to light. Funduscopic exam without evidence of pallor or edema.  Extraocular movements  in vertical and horizontal planes intact and without nystagmus. Visual fields by finger perimetry are intact. Hearing to finger rub intact.   Facial sensation intact to fine touch.  Facial motor strength is symmetric and tongue and uvula move midline. Shoulder shrug was symmetrical.   Motor exam: Normal tone, muscle bulk and symmetric strength in all extremities.  Sensory:  Fine touch, pinprick and vibration were tested in all extremities. Proprioception tested in the upper extremities was normal.  Coordination: Rapid alternating movements in the fingers/hands was normal.  Finger-to-nose maneuver  normal without evidence of ataxia, dysmetria or tremor.  Gait and station: Patient walks without assistive device and is able unassisted to climb up to the exam table. Strength within normal limits.  Stance is stable and normal.    Deep tendon reflexes: in the  upper and lower extremities are symmetric and intact. Babinski maneuver response is  downgoing.  The patient was advised of the nature of the diagnosed sleep disorder , the treatment options and risks for general a health and wellness arising from not treating the condition.  I spent more than 35 minutes of face to face time with the patient. Greater than 50% of time was spent in counseling and coordination of care. We have discussed the diagnosis and differential and I answered the patient's questions.     Assessment:  After physical and neurologic examination, review of laboratory studies,  Personal review of imaging studies, reports of other /same  Imaging studies ,  Results of polysomnography/ neurophysiology testing and pre-existing records as far as provided in visit., my assessment is   1) no Shane Sawyer has some risk factors for obstructive sleep apnea, these include a larger neck size, a recently more elevated body mass index. He does not have a high-grade Mallampati. He does not wake up this morning headaches but he is not necessarily refreshed and restored. One problem is his very irregular sleep and wake pattern. He would probably benefit from designating one more hour to sleep. His wife has noted him to snore and on occasion to grasp but she has not noticed apneas per se. I would like to do is to obtain a sleep study to screen him for sleep apnea if his sleep apnea index of less than tenderness found I would recommend for him a dental device, a higher degree of apnea should be present CPAP therapy is usually indicated. The may by no apnea present and only snoring and this kind of sleep disorders breathing is usually better treated with positional therapy and a dental device.   Plan:  Treatment plan and additional workup :  Split night, no  Co2 measures.  Rv after test.   Sleep hygiene.    Asencion Partridge Eloyce Bultman MD  01/13/2016   CC: Darlyne Russian, [address] [place], [place] [address]

## 2016-01-13 NOTE — Patient Instructions (Signed)
Hypersomnia Hypersomnia is when you feel extremely tired during the day even though you're getting plenty of sleep at night. You may need to take naps during the day, and you may also be extremely difficult to wake up when you are sleeping.  CAUSES  The cause of your hypersomnia may not be known. Hypersomnia may be caused by:   Medicines.  Sleep disorders, such as narcolepsy.  Trauma or injury to your head or nervous system.  Using drugs or alcohol.  Tumors.  Medical conditions, such as depression or hypothyroidism.  Genetics. SIGNS AND SYMPTOMS  The main symptoms of hypersomnia include:   Feeling extremely tired throughout the day.  Being very difficult to wake up.  Sleeping for longer and longer periods.  Taking naps throughout the day. Other symptoms may include:   Feeling:  Restless.  Annoyed.  Anxious.  Low energy.  Having difficulty:  Remembering.  Speaking.  Thinking.  Losing your appetite.  Experiencing hallucinations. DIAGNOSIS  Hypersomnia may be diagnosed by:  Medical history and physical exam. This will include a sleep history.  Completing sleep logs.  Tests may also be done, such as:  Polysomnography.  Multiple sleep latency test (MSLT). TREATMENT  There is no cure for hypersomnia, but treatment can be very effective in helping manage the condition. Treatment may include:  Lifestyle and sleeping strategies to help cope with the condition.  Stimulant medicines.  Treating any underlying causes of hypersomnia. HOME CARE INSTRUCTIONS  Take medicines only as directed by your health care provider.  Schedule short naps for when you feel sleepiest during the day. Tell your employer or teachers that you have hypersomnia. You may be able to adjust your schedule to include time for naps.  Avoid drinking alcohol or caffeinated beverages.  Do not eat a heavy meal before bedtime. Eat at about the same times every day.  Do not drive or  operate heavy machinery if you are sleepy.  Do not swim or go out on the water without a life jacket.  If possible, adjust your schedule so that you do not have to work or be active at night.  Keep all follow-up visits as directed by your health care provider. This is important. SEEK MEDICAL CARE IF:   You have new symptoms.  Your symptoms get worse. SEEK IMMEDIATE MEDICAL CARE IF:  You have serious thoughts of hurting yourself or someone else.   This information is not intended to replace advice given to you by your health care provider. Make sure you discuss any questions you have with your health care provider.   Document Released: 10/07/2002 Document Revised: 11/07/2014 Document Reviewed: 05/22/2014 Elsevier Interactive Patient Education 2016 [employer]  

## 2016-01-18 ENCOUNTER — Encounter: Payer: Self-pay | Admitting: Gastroenterology

## 2016-01-19 ENCOUNTER — Encounter: Payer: Self-pay | Admitting: *Deleted

## 2016-02-01 ENCOUNTER — Telehealth: Payer: Self-pay

## 2016-02-01 DIAGNOSIS — R0683 Snoring: Secondary | ICD-10-CM

## 2016-02-01 NOTE — Telephone Encounter (Signed)
Order for Home Sleep Test placed.

## 2016-02-01 NOTE — Telephone Encounter (Signed)
-----   Message from [facility] sent at 02/01/2016  2:53 PM EDT ----- Regarding: order Sorry one more denial for in lab. Need HST order. Thanks so much

## 2016-02-26 ENCOUNTER — Ambulatory Visit (INDEPENDENT_AMBULATORY_CARE_PROVIDER_SITE_OTHER): Payer: BLUE CROSS/BLUE SHIELD | Admitting: Podiatry

## 2016-02-26 ENCOUNTER — Ambulatory Visit (INDEPENDENT_AMBULATORY_CARE_PROVIDER_SITE_OTHER): Payer: BLUE CROSS/BLUE SHIELD

## 2016-02-26 ENCOUNTER — Encounter: Payer: Self-pay | Admitting: Podiatry

## 2016-02-26 VITALS — BP 108/62 | HR 78 | Resp 16

## 2016-02-26 DIAGNOSIS — M79671 Pain in right foot: Secondary | ICD-10-CM

## 2016-02-26 DIAGNOSIS — M79672 Pain in left foot: Secondary | ICD-10-CM

## 2016-02-26 DIAGNOSIS — L600 Ingrowing nail: Secondary | ICD-10-CM

## 2016-02-26 DIAGNOSIS — M779 Enthesopathy, unspecified: Secondary | ICD-10-CM | POA: Diagnosis not present

## 2016-02-26 DIAGNOSIS — M722 Plantar fascial fibromatosis: Secondary | ICD-10-CM

## 2016-02-26 DIAGNOSIS — G629 Polyneuropathy, unspecified: Secondary | ICD-10-CM

## 2016-02-26 LAB — HEPATIC FUNCTION PANEL
ALBUMIN: 4.1 g/dL (ref 3.6–5.1)
ALK PHOS: 39 U/L — AB (ref 40–115)
ALT: 28 U/L (ref 9–46)
AST: 24 U/L (ref 10–35)
BILIRUBIN INDIRECT: 0.5 mg/dL (ref 0.2–1.2)
Bilirubin, Direct: 0.1 mg/dL (ref <=0.2)
TOTAL PROTEIN: 6.7 g/dL (ref 6.1–8.1)
Total Bilirubin: 0.6 mg/dL (ref 0.2–1.2)

## 2016-02-26 MED ORDER — TRIAMCINOLONE ACETONIDE 10 MG/ML IJ SUSP
10.0000 mg | Freq: Once | INTRAMUSCULAR | Status: AC
  Administered 2016-02-26: 10 mg

## 2016-02-26 MED ORDER — GABAPENTIN 300 MG PO CAPS: 300.0000 mg | ORAL_CAPSULE | Freq: Three times a day (TID) | ORAL | Status: DC

## 2016-02-26 MED ORDER — TERBINAFINE HCL 250 MG PO TABS
250.0000 mg | ORAL_TABLET | Freq: Every day | ORAL | Status: DC
Start: 2016-02-26 — End: 2017-01-12

## 2016-02-26 NOTE — Patient Instructions (Signed)

## 2016-02-29 NOTE — Progress Notes (Signed)
Subjective:     Patient ID: Shane Sawyer, male   DOB: 1956/01/31, 59 y.o.   MRN: [id number]  HPI patient presents with history of burning in his feet heel pain which has resolved somewhat with previous treatment and a lot of pain in the outside of both feet. Also complains of ingrown toenail right big toe which makes it hard to walk   Review of Systems     Objective:   Physical Exam Neurovascular status intact muscle strength adequate with pain in the outside of both feet with inflammation around the tendinous complex. Patient's also noted to have incurvated medial border right hallux dry feet and thick yellow brittle nailbeds 1-5 both feet    Assessment:     Peroneal tendinitis bilateral mild plantar fasciitis bilateral ingrown toenail right big toe and mycotic nail infection    Plan:     H&P and all conditions reviewed with patient and x-rays reviewed. Today I went ahead and I injected the peroneal complex bilateral 3 mg Kenalog 5 mg Xylocaine and I went ahead recommend correction of the ingrown explaining risk. He wants to procedure and I infiltrated the right hallux 60 mg Xylocaine Marcaine mixture remove the medial border exposed matrix and applied phenol 3 applications 30 seconds followed by alcohol lavage and sterile dressing. Gave instructions on soaks and will be seen back and also placed on Lamisil 250 mg daily after he gets a liver function study done and placed him on gabapentin to start at nighttime to try to see if it reduces burning  X-ray report indicated there is spur formation plantar heel moderate depression of the arch but no indication of stress fracture

## 2016-03-07 ENCOUNTER — Telehealth: Payer: Self-pay | Admitting: *Deleted

## 2016-03-07 MED ORDER — NONFORMULARY OR COMPOUNDED ITEM: Status: DC

## 2016-03-07 NOTE — Telephone Encounter (Signed)
Dr. Paulla Dolly reviewed pt's Hepatic function as abnormal and he did not want the pt to take the Lamisil.  I informed pt and he would like to try an alternative medication. Dr. Paulla Dolly ordered [employer] Onychomycosis Nail Lacquer,+11refills.  Faxed to [facility].

## 2016-03-11 ENCOUNTER — Telehealth: Payer: Self-pay | Admitting: *Deleted

## 2016-03-11 NOTE — Telephone Encounter (Signed)
Left message for patient at [phone] (Cell #) to check to see how they were doing from their ingrown toenail procedure that was performed on Friday, February 26, 2016. Waiting for a response.

## 2016-03-21 ENCOUNTER — Telehealth: Payer: Self-pay

## 2016-03-21 NOTE — Telephone Encounter (Signed)
Waiting on payment of $52.75 for 94 pages. From [facility]

## 2016-03-22 NOTE — Telephone Encounter (Signed)
Records faxed to [facility] on 5/23

## 2016-04-06 ENCOUNTER — Other Ambulatory Visit: Payer: Self-pay

## 2016-04-06 MED ORDER — METOPROLOL SUCCINATE ER 50 MG PO TB24: 50.0000 mg | ORAL_TABLET | Freq: Every day | ORAL | Status: DC

## 2016-04-22 ENCOUNTER — Ambulatory Visit: Payer: BLUE CROSS/BLUE SHIELD | Admitting: Podiatry

## 2016-04-28 ENCOUNTER — Other Ambulatory Visit: Payer: Self-pay

## 2016-04-28 MED ORDER — LOSARTAN POTASSIUM-HCTZ 100-25 MG PO TABS: 1.0000 | ORAL_TABLET | Freq: Every day | ORAL | Status: DC

## 2016-04-28 NOTE — Telephone Encounter (Signed)
Losartan HCTZ 100/25 #90 1 per day- last - written 04/2015 30 day supply refilled - note patient needs RTC

## 2016-05-10 ENCOUNTER — Ambulatory Visit (INDEPENDENT_AMBULATORY_CARE_PROVIDER_SITE_OTHER): Payer: BLUE CROSS/BLUE SHIELD | Admitting: Family Medicine

## 2016-05-10 ENCOUNTER — Ambulatory Visit (INDEPENDENT_AMBULATORY_CARE_PROVIDER_SITE_OTHER): Payer: BLUE CROSS/BLUE SHIELD

## 2016-05-10 VITALS — BP 128/82 | HR 75 | Temp 99.1°F | Resp 17 | Ht 73.0 in | Wt 237.0 lb

## 2016-05-10 DIAGNOSIS — I10 Essential (primary) hypertension: Secondary | ICD-10-CM | POA: Diagnosis not present

## 2016-05-10 DIAGNOSIS — E785 Hyperlipidemia, unspecified: Secondary | ICD-10-CM | POA: Diagnosis not present

## 2016-05-10 DIAGNOSIS — R0789 Other chest pain: Secondary | ICD-10-CM

## 2016-05-10 DIAGNOSIS — J392 Other diseases of pharynx: Secondary | ICD-10-CM

## 2016-05-10 DIAGNOSIS — R05 Cough: Secondary | ICD-10-CM

## 2016-05-10 DIAGNOSIS — R058 Other specified cough: Secondary | ICD-10-CM

## 2016-05-10 DIAGNOSIS — R42 Dizziness and giddiness: Secondary | ICD-10-CM | POA: Diagnosis not present

## 2016-05-10 LAB — GLUCOSE, POCT (MANUAL RESULT ENTRY): POC GLUCOSE: 74 mg/dL (ref 70–99)

## 2016-05-10 LAB — POCT CBC
GRANULOCYTE PERCENT: 59.4 % (ref 37–80)
HCT, POC: 39.8 % — AB (ref 43.5–53.7)
HEMOGLOBIN: 13.8 g/dL — AB (ref 14.1–18.1)
Lymph, poc: 2.5 (ref 0.6–3.4)
MCH, POC: 31.2 pg (ref 27–31.2)
MCHC: 34.6 g/dL (ref 31.8–35.4)
MCV: 90.3 fL (ref 80–97)
MID (cbc): 0.9 (ref 0–0.9)
MPV: 7.5 fL (ref 0–99.8)
PLATELET COUNT, POC: 197 10*3/uL (ref 142–424)
POC Granulocyte: 4.9 (ref 2–6.9)
POC LYMPH %: 30.1 % (ref 10–50)
POC MID %: 10.5 %M (ref 0–12)
RBC: 4.41 M/uL — AB (ref 4.69–6.13)
RDW, POC: 13.6 %
WBC: 8.3 10*3/uL (ref 4.6–10.2)

## 2016-05-10 MED ORDER — METOPROLOL SUCCINATE ER 50 MG PO TB24: 50.0000 mg | ORAL_TABLET | Freq: Every day | ORAL | Status: DC

## 2016-05-10 MED ORDER — LOSARTAN POTASSIUM-HCTZ 100-25 MG PO TABS: 1.0000 | ORAL_TABLET | Freq: Every day | ORAL | Status: DC

## 2016-05-10 MED ORDER — METOPROLOL SUCCINATE ER 50 MG PO TB24
50.0000 mg | ORAL_TABLET | Freq: Every day | ORAL | Status: DC
Start: 2016-05-10 — End: 2016-11-08

## 2016-05-10 NOTE — Progress Notes (Signed)
Subjective:  By signing my name below, I, Moises Blood, attest that this documentation has been prepared under the direction and in the presence of Merri Ray, MD. Electronically Signed: Moises Blood, [place]. 05/10/2016 , 4:17 PM .  Patient was seen in Room 12 .   Patient ID: Shane Sawyer, male    DOB: September 06, 1956, 60 y.o.   MRN: [id number] Chief Complaint  Patient presents with  . Cough  . Medication Refill    toprol   HPI Shane Sawyer is a 60 y.o. male Here for 2 concerns: cough and medication refill of toprol. PCP is DAUB, STEVE A, MD. 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   HTN Lab Results  Component Value Date   CREATININE 0.74 08/26/2015   Phone note from March discussing sleep study; treatment for sleep apnea. He takes toprol 50mg  qd. He's also on losartan-HCTZ 100-25 mg qd. He ran out of his toprol yesterday. His last meal was 11:30AM ~ 12:00PM.   He mentions he was started on toprol to lower his heart rate. He denies history of smoking.   Dizziness He reports steady dizziness 4 days ago while walking around at an auction. He states he had chest tightness with sweating, and had to sit down for a few minutes. He mentions he had taken cialis 20mg  that day, as well as drinking more the night before. He also notes not taking BP medication that day. After he stopped taking the cialis, he hasn't had a dizziness spell since. He denies nausea or vomiting. He denies history of anemia. He has blood in stool due to hemorrhoids recently.   He had a low risk stress test done with no ischemia in Oct 2016. He also had an echo done, which was normal with moderate diastolic function. He isn't taking aspirin. He's taking neurontin 300mg  once a day. He's had intermittent chest tightness for several years.   HLD See lab note Oct 2016. He declined taking statin due to muscle weakness the last time he took a statin.   Cough History of reflux, and has taken prilosec in the past.     He complains of a cough caused by a "tickle in his throat". He notes the cough has been causing his throat to become dry and sore. He has an occasional productive cough with phlegm. He's still on prilosec daily. He drinks beer a couple times on the weekends, about an average of 2~3 times a week. He denies fever or shortness of breath. He denies allegra or claritin help with the cough.   Patient Active Problem List   Diagnosis Date Noted  . Rectal bleeding 09/21/2015  . Change in bowel habits 09/21/2015  . Special screening for malignant neoplasms, colon 09/21/2015  . Tremor 09/10/2013  . Unspecified essential hypertension 08/29/2013   Past Medical History  Diagnosis Date  . HBP (high blood pressure)   . Erectile dysfunction    Past Surgical History  Procedure Laterality Date  . Nose surgery      times 2   Allergies  Allergen Reactions  . Ivp Dye [Iodinated Diagnostic Agents]     Swelling in throat, SOB  . Penicillins   . Sulfur    Prior to Admission medications   Medication Sig Start Date End Date Taking? Authorizing Provider  AMBULATORY NON FORMULARY MEDICATION Liquid silver nitrite  1 dose by mouth daily   Yes Historical Provider, MD  AMBULATORY NON [facility] Takes one tablet daily  Yes Historical Provider, MD  AMBULATORY NON FORMULARY MEDICATION L-arginine Take one tablet daily   Yes Historical Provider, MD  APPLE CIDER VINEGAR PO Take 1 Dose by mouth daily.   Yes Historical Provider, MD  COD LIVER OIL PO Take 1 Dose by mouth daily.   Yes Historical Provider, MD  Coenzyme Q10 (CO Q-10 PO) Take 1 tablet by mouth daily.   Yes Historical Provider, MD  FOLIC ACID PO Take 1 tablet by mouth daily.   Yes Historical Provider, MD  gabapentin (NEURONTIN) 300 MG capsule Take 1 capsule (300 mg total) by mouth 3 (three) times daily. 02/26/16  Yes Tamala Fothergill Regal, DPM  GARLIC PO Take 1 tablet by mouth daily.   Yes Historical Provider, MD   losartan-hydrochlorothiazide (HYZAAR) 100-25 MG tablet Take 1 tablet by mouth daily. 04/28/16  Yes Wardell Honour, MD  metoprolol succinate (TOPROL-XL) 50 MG 24 hr tablet Take 1 tablet (50 mg total) by mouth daily. Take with or immediately following a meal. 04/06/16  Yes Burtis Junes, NP  MILK THISTLE PO Take 1 tablet by mouth daily.   Yes Historical Provider, MD  NONFORMULARY OR COMPOUNDED [place] compounding:  Onychomycosis Nail Lacquer - Fluconazole 2%, Terbinafine 1%, DMSO. Apply daily to affected area. +11 refills. 03/07/16  Yes Wallene Huh, DPM  Omega-3 Fatty Acids (FISH OIL PO) Take 1 tablet by mouth daily.   Yes Historical Provider, MD  omeprazole (PRILOSEC) 20 MG capsule Take 20 mg by mouth daily.   Yes Historical Provider, MD  Plant Sterols and Stanols (CHOLEST OFF PO) Take 1 tablet by mouth daily.   Yes Historical Provider, MD  tadalafil (CIALIS) 20 MG tablet Take 20 mg by mouth daily as needed for erectile dysfunction.   Yes Historical Provider, MD  terbinafine (LAMISIL) 250 MG tablet Take 1 tablet (250 mg total) by mouth daily. 02/26/16  Yes Wallene Huh, DPM  thiamine (VITAMIN B-1) 100 MG tablet Take 100 mg by mouth daily.   Yes Historical Provider, MD  Niacin (VITAMIN B-3 PO) Take 1 tablet by mouth daily. Reported on 05/10/2016    Historical Provider, MD   Social History   Social History  . Marital Status: Married    Spouse Name: N/A  . Number of Children: 1  . Years of Education: 12+   Occupational History  . Not on file.   Social History Main Topics  . Smoking status: Never Smoker   . Smokeless tobacco: Never Used  . Alcohol Use: 1.2 oz/week    2 Cans of beer per week     Comment: weekly   . Drug Use: No  . Sexual Activity: Not on file   Other Topics Concern  . Not on file   Social History Narrative   Patient lives at home girlfriend [name].   Patient has no children but one the way.    Patient is currently working, he buys cars in  sell them.   Patient has some college.   Review of Systems  Constitutional: Positive for diaphoresis. Negative for fever, chills and fatigue.  HENT: Positive for sore throat.   Respiratory: Positive for cough and chest tightness. Negative for shortness of breath and wheezing.   Gastrointestinal: Positive for blood in stool. Negative for nausea, vomiting and diarrhea.  Neurological: Positive for dizziness.       Objective:   Physical Exam  Constitutional: He is oriented to person, place, and time. He appears well-developed and well-nourished.  HENT:  Head: Normocephalic and atraumatic.  Right Ear: Tympanic membrane, external ear and ear canal normal.  Left Ear: Tympanic membrane, external ear and ear canal normal.  Nose: No rhinorrhea.  Mouth/Throat: Oropharynx is clear and moist and mucous membranes are normal. No oropharyngeal exudate or posterior oropharyngeal erythema.  Eyes: Conjunctivae and EOM are normal. Pupils are equal, round, and reactive to light.  Neck: Neck supple. No JVD present. Carotid bruit is not present.  Cardiovascular: Normal rate, regular rhythm, normal heart sounds and intact distal pulses.   No murmur heard. Pulmonary/Chest: Effort normal and breath sounds normal. He has no wheezes. He has no rhonchi. He has no rales.  Abdominal: Soft. There is no tenderness.  Musculoskeletal: He exhibits no edema.  Lymphadenopathy:    He has no cervical adenopathy.  Neurological: He is alert and oriented to person, place, and time.  Skin: Skin is warm and dry. No rash noted.  Psychiatric: He has a normal mood and affect. His behavior is normal.  Vitals reviewed.   Filed Vitals:   05/10/16 1536  BP: 128/82  Pulse: 75  Temp: 99.1 F (37.3 C)  TempSrc: Oral  Resp: 17  Height: 6\' 1"  (1.854 m)  Weight: 237 lb (107.502 kg)  SpO2: 95%   Results for orders placed or performed in visit on 05/10/16  POCT CBC  Result Value Ref Range   WBC 8.3 4.6 - 10.2 K/uL   Lymph,  poc 2.5 0.6 - 3.4   POC LYMPH PERCENT 30.1 10 - 50 %L   MID (cbc) 0.9 0 - 0.9   POC MID % 10.5 0 - 12 %M   POC Granulocyte 4.9 2 - 6.9   Granulocyte percent 59.4 37 - 80 %G   RBC 4.41 (A) 4.69 - 6.13 M/uL   Hemoglobin 13.8 (A) 14.1 - 18.1 g/dL   HCT, POC 39.8 (A) 43.5 - 53.7 %   MCV 90.3 80 - 97 fL   MCH, POC 31.2 27 - 31.2 pg   MCHC 34.6 31.8 - 35.4 g/dL   RDW, POC 13.6 %   Platelet Count, POC 197 142 - 424 K/uL   MPV 7.5 0 - 99.8 fL  POCT glucose (manual entry)  Result Value Ref Range   POC Glucose 74 70 - 99 mg/dl   EKG: sinus rhythm, no acute findings apparent, although non specific t-wave in lead III without concerning findings in other inferior leads  Dg Chest 2 View  05/10/2016  CLINICAL DATA:  Cough for 2 weeks, initial encounter EXAM: CHEST  2 VIEW COMPARISON:  05/27/2004 FINDINGS: The heart size and mediastinal contours are within normal limits. Both lungs are clear. The visualized skeletal structures are unremarkable. IMPRESSION: No active cardiopulmonary disease. Electronically Signed   By: Inez Catalina M.D.   On: 05/10/2016 16:51      Assessment & Plan:   Shane Sawyer is a 60 y.o. male Dry cough, Dry throat - Plan: POCT CBC, DG Chest 2 View  - multiple possible causes discussed. Trial of antihistamine for all rhinitis. trigger avoidance for GERD in case LPR cause. rtc precautions if persists/worsens.   Chest tightness - Plan: EKG 12-Lead  -no apparent acute findings on ekg. If return of chest symptoms, dizziness or diaphoresis - eval in ER or 911.   -follow up with cardiology recommended.   Essential hypertension - Plan: COMPLETE METABOLIC PANEL WITH GFR, metoprolol succinate (TOPROL-XL) 50 MG 24 hr tablet, losartan-hydrochlorothiazide (HYZAAR) 100-25 MG tablet, DISCONTINUED: losartan-hydrochlorothiazide (HYZAAR) 100-25  MG tablet, DISCONTINUED: metoprolol succinate (TOPROL-XL) 50 MG 24 hr tablet, DISCONTINUED: losartan-hydrochlorothiazide (HYZAAR) 100-25 MG tablet,  DISCONTINUED: metoprolol succinate (TOPROL-XL) 50 MG 24 hr tablet  - controlled in office. Doubt HTN meds cause of dizziness, but if recurs - rtc to discuss further.   Dizziness - Plan: POCT CBC, POCT glucose (manual entry)  -avoid higher dose of Cialis in future and hold off on Cialis for now until all symptoms resolve. Maintain hydration and regular meals, RTC precautions if persistent  Hyperlipidemia - Plan: COMPLETE METABOLIC PANEL WITH GFR, Lipid panel  - check labs above.   Meds ordered this encounter  Medications  . DISCONTD: losartan-hydrochlorothiazide (HYZAAR) 100-25 MG tablet    Sig: Take 1 tablet by mouth daily.    Dispense:  30 tablet    Refill:  0    Pt needs to return to clinic for reevaluation prior to further refills  . DISCONTD: metoprolol succinate (TOPROL-XL) 50 MG 24 hr tablet    Sig: Take 1 tablet (50 mg total) by mouth daily. Take with or immediately following a meal.    Dispense:  30 tablet    Refill:  0    Please call [phone] to schedule appointment for additional refills thanks. (FINAL ATTEMPT 04/06/16) Or send request to PCP  . DISCONTD: losartan-hydrochlorothiazide (HYZAAR) 100-25 MG tablet    Sig: Take 1 tablet by mouth daily.    Dispense:  90 tablet    Refill:  1  . DISCONTD: metoprolol succinate (TOPROL-XL) 50 MG 24 hr tablet    Sig: Take 1 tablet (50 mg total) by mouth daily. Take with or immediately following a meal.    Dispense:  90 tablet    Refill:  1  . metoprolol succinate (TOPROL-XL) 50 MG 24 hr tablet    Sig: Take 1 tablet (50 mg total) by mouth daily. Take with or immediately following a meal.    Dispense:  90 tablet    Refill:  1  . losartan-hydrochlorothiazide (HYZAAR) 100-25 MG tablet    Sig: Take 1 tablet by mouth daily.    Dispense:  90 tablet    Refill:  1   Patient Instructions       IF you received an x-ray today, you will receive an invoice from [facility]. Please contact [facility] at [phone]  with questions or concerns regarding your invoice.   IF you received labwork today, you will receive an invoice from [employer]. Please contact [facility] at [phone] with questions or concerns regarding your invoice.   Our billing staff will not be able to assist you with questions regarding bills from these companies.  You will be contacted with the lab results as soon as they are available. The fastest way to get your results is to activate your My Chart account. Instructions are located on the last page of this paperwork. If you have not heard from [place] regarding the results in 2 weeks, please contact this office.     You appear to be overdue for follow up with cardiology - call them to schedule this:  [facility] [address] [place], [place] [address] Phone: [phone]  Cough can come from a number of causes. See foods to avoid below. You can also try an over the counter allegra or claritin for the allergies.  Dizziness may be from the Cialis dose. Avoid that medicine for now. Before we restart that medicine - at lower dose - would  want to see all your symptoms improve.   Continue the Toprol and other blood pressure medicine at same dose for now, but if dizziness returns, we may need to hold one of these medications as well. Make sure you're drinking plenty of fluids throughout the day, and regular meals throughout the day to lessen chance of dizziness from those causes.  If any return of chest pain, dizziness, sweating, go to the emergency room or call 911.  Your blood count was borderline low, but should not be the cause of your dizziness. I would recommend you recheck this in the next 4-6 weeks to make sure that it is improved. Sooner if any persistent dizziness.  Return to the clinic or go to the nearest emergency room if any of your symptoms worsen or new symptoms occur.  Nonspecific Chest Pain  Chest  pain can be caused by many different conditions. There is always a chance that your pain could be related to something serious, such as a heart attack or a blood clot in your lungs. Chest pain can also be caused by conditions that are not life-threatening. If you have chest pain, it is very important to follow up with your health care provider. CAUSES  Chest pain can be caused by:  Heartburn.  Pneumonia or bronchitis.  Anxiety or stress.  Inflammation around your heart (pericarditis) or lung (pleuritis or pleurisy).  A blood clot in your lung.  A collapsed lung (pneumothorax). It can develop suddenly on its own (spontaneous pneumothorax) or from trauma to the chest.  Shingles infection (varicella-zoster virus).  Heart attack.  Damage to the bones, muscles, and cartilage that make up your chest wall. This can include:  Bruised bones due to injury.  Strained muscles or cartilage due to frequent or repeated coughing or overwork.  Fracture to one or more ribs.  Sore cartilage due to inflammation (costochondritis). RISK FACTORS  Risk factors for chest pain may include:  Activities that increase your risk for trauma or injury to your chest.  Respiratory infections or conditions that cause frequent coughing.  Medical conditions or overeating that can cause heartburn.  Heart disease or family history of heart disease.  Conditions or health behaviors that increase your risk of developing a blood clot.  Having had chicken pox (varicella zoster). SIGNS AND SYMPTOMS Chest pain can feel like:  Burning or tingling on the surface of your chest or deep in your chest.  Crushing, pressure, aching, or squeezing pain.  Dull or sharp pain that is worse when you move, cough, or take a deep breath.  Pain that is also felt in your back, neck, shoulder, or arm, or pain that spreads to any of these areas. Your chest pain may come and go, or it may stay constant. DIAGNOSIS Lab tests or  other studies may be needed to find the cause of your pain. Your health care provider may have you take a test called an ambulatory ECG (electrocardiogram). An ECG records your heartbeat patterns at the time the test is performed. You may also have other tests, such as:  Transthoracic echocardiogram (TTE). During echocardiography, sound waves are used to create a picture of all of the heart structures and to look at how blood flows through your heart.  Transesophageal echocardiogram (TEE).This is a more advanced imaging test that obtains images from inside your body. It allows your health care provider to see your heart in finer detail.  Cardiac monitoring. This allows your health care provider to monitor your  heart rate and rhythm in real time.  Holter monitor. This is a portable device that records your heartbeat and can help to diagnose abnormal heartbeats. It allows your health care provider to track your heart activity for several days, if needed.  Stress tests. These can be done through exercise or by taking medicine that makes your heart beat more quickly.  Blood tests.  Imaging tests. TREATMENT  Your treatment depends on what is causing your chest pain. Treatment may include:  Medicines. These may include:  Acid blockers for heartburn.  Anti-inflammatory medicine.  Pain medicine for inflammatory conditions.  Antibiotic medicine, if an infection is present.  Medicines to dissolve blood clots.  Medicines to treat coronary artery disease.  Supportive care for conditions that do not require medicines. This may include:  Resting.  Applying heat or cold packs to injured areas.  Limiting activities until pain decreases. HOME CARE INSTRUCTIONS  If you were prescribed an antibiotic medicine, finish it all even if you start to feel better.  Avoid any activities that bring on chest pain.  Do not use any tobacco products, including cigarettes, chewing tobacco, or electronic  cigarettes. If you need help quitting, ask your health care provider.  Do not drink alcohol.  Take medicines only as directed by your health care provider.  Keep all follow-up visits as directed by your health care provider. This is important. This includes any further testing if your chest pain does not go away.  If heartburn is the cause for your chest pain, you may be told to keep your head raised (elevated) while sleeping. This reduces the chance that acid will go from your stomach into your esophagus.  Make lifestyle changes as directed by your health care provider. These may include:  Getting regular exercise. Ask your health care provider to suggest some activities that are safe for you.  Eating a heart-healthy diet. A registered dietitian can help you to learn healthy eating options.  Maintaining a healthy weight.  Managing diabetes, if necessary.  Reducing stress. SEEK MEDICAL CARE IF:  Your chest pain does not go away after treatment.  You have a rash with blisters on your chest.  You have a fever. SEEK IMMEDIATE MEDICAL CARE IF:   Your chest pain is worse.  You have an increasing cough, or you cough up blood.  You have severe abdominal pain.  You have severe weakness.  You faint.  You have chills.  You have sudden, unexplained chest discomfort.  You have sudden, unexplained discomfort in your arms, back, neck, or jaw.  You have shortness of breath at any time.  You suddenly start to sweat, or your skin gets clammy.  You feel nauseous or you vomit.  You suddenly feel light-headed or dizzy.  Your heart begins to beat quickly, or it feels like it is skipping beats. These symptoms may represent a serious problem that is an emergency. Do not wait to see if the symptoms will go away. Get medical help right away. Call your local emergency services (911 in the [place] Do not drive yourself to the hospital.   This information is not intended to replace advice  given to you by your health care provider. Make sure you discuss any questions you have with your health care provider.   Document Released: 07/27/2005 Document Revised: 11/07/2014 Document Reviewed: 05/23/2014 Elsevier Interactive Patient Education 2016 [place] for Gastroesophageal Reflux Disease, Adult When you have gastroesophageal reflux disease (GERD), the foods you  eat and your eating habits are very important. Choosing the right foods can help ease the discomfort of GERD. WHAT GENERAL GUIDELINES DO I NEED TO FOLLOW?  Choose fruits, vegetables, whole grains, low-fat dairy products, and low-fat meat, fish, and poultry.  Limit fats such as oils, salad dressings, butter, nuts, and avocado.  Keep a food diary to identify foods that cause symptoms.  Avoid foods that cause reflux. These may be different for different people.  Eat frequent small meals instead of three large meals each day.  Eat your meals slowly, in a relaxed setting.  Limit fried foods.  Cook foods using methods other than frying.  Avoid drinking alcohol.  Avoid drinking large amounts of liquids with your meals.  Avoid bending over or lying down until 2-3 hours after eating. WHAT FOODS ARE NOT RECOMMENDED? The following are some foods and drinks that may worsen your symptoms: Vegetables Tomatoes. Tomato juice. Tomato and spaghetti sauce. Chili peppers. Onion and garlic. Horseradish. Fruits Oranges, grapefruit, and lemon (fruit and juice). Meats High-fat meats, fish, and poultry. This includes hot dogs, ribs, ham, sausage, salami, and bacon. Dairy Whole milk and chocolate milk. Sour cream. Cream. Butter. Ice cream. Cream cheese.  Beverages Coffee and tea, with or without caffeine. Carbonated beverages or energy drinks. Condiments Hot sauce. Barbecue sauce.  Sweets/Desserts Chocolate and cocoa. Donuts. Peppermint and spearmint. Fats and Oils High-fat foods, including [place] fries  and potato chips. Other Vinegar. Strong spices, such as black pepper, white pepper, red pepper, cayenne, curry powder, cloves, ginger, and chili powder. The items listed above may not be a complete list of foods and beverages to avoid. Contact your dietitian for more information.   This information is not intended to replace advice given to you by your health care provider. Make sure you discuss any questions you have with your health care provider.   Document Released: 10/17/2005 Document Revised: 11/07/2014 Document Reviewed: 08/21/2013 Elsevier Interactive Patient Education 2016 Elsevier Inc. Cough, Adult Coughing is a reflex that clears your throat and your airways. Coughing helps to heal and protect your lungs. It is normal to cough occasionally, but a cough that happens with other symptoms or lasts a long time may be a sign of a condition that needs treatment. A cough may last only 2-3 weeks (acute), or it may last longer than 8 weeks (chronic). CAUSES Coughing is commonly caused by:  Breathing in substances that irritate your lungs.  A viral or bacterial respiratory infection.  Allergies.  Asthma.  Postnasal drip.  Smoking.  Acid backing up from the stomach into the esophagus (gastroesophageal reflux).  Certain medicines.  Chronic lung problems, including COPD (or rarely, lung cancer).  Other medical conditions such as heart failure. HOME CARE INSTRUCTIONS  Pay attention to any changes in your symptoms. Take these actions to help with your discomfort:  Take medicines only as told by your health care provider.  If you were prescribed an antibiotic medicine, take it as told by your health care provider. Do not stop taking the antibiotic even if you start to feel better.  Talk with your health care provider before you take a cough suppressant medicine.  Drink enough fluid to keep your urine clear or pale yellow.  If the air is dry, use a cold steam vaporizer or  humidifier in your bedroom or your home to help loosen secretions.  Avoid anything that causes you to cough at work or at home.  If your cough is worse at night, try  sleeping in a semi-upright position.  Avoid cigarette smoke. If you smoke, quit smoking. If you need help quitting, ask your health care provider.  Avoid caffeine.  Avoid alcohol.  Rest as needed. SEEK MEDICAL CARE IF:   You have new symptoms.  You cough up pus.  Your cough does not get better after 2-3 weeks, or your cough gets worse.  You cannot control your cough with suppressant medicines and you are losing sleep.  You develop pain that is getting worse or pain that is not controlled with pain medicines.  You have a fever.  You have unexplained weight loss.  You have night sweats. SEEK IMMEDIATE MEDICAL CARE IF:  You cough up blood.  You have difficulty breathing.  Your heartbeat is very fast.   This information is not intended to replace advice given to you by your health care provider. Make sure you discuss any questions you have with your health care provider.   Document Released: 04/15/2011 Document Revised: 07/08/2015 Document Reviewed: 12/24/2014 Elsevier Interactive Patient Education 2016 Elsevier Inc.  Dizziness Dizziness is a common problem. It is a feeling of unsteadiness or light-headedness. You may feel like you are about to faint. Dizziness can lead to injury if you stumble or fall. Anyone can become dizzy, but dizziness is more common in older adults. This condition can be caused by a number of things, including medicines, dehydration, or illness. HOME CARE INSTRUCTIONS Taking these steps may help with your condition: Eating and Drinking  Drink enough fluid to keep your urine clear or pale yellow. This helps to keep you from becoming dehydrated. Try to drink more clear fluids, such as water.  Do not drink alcohol.  Limit your caffeine intake if directed by your health care  provider.  Limit your salt intake if directed by your health care provider. Activity  Avoid making quick movements.  Rise slowly from chairs and steady yourself until you feel okay.  In the morning, first sit up on the side of the bed. When you feel okay, stand slowly while you hold onto something until you know that your balance is fine.  Move your legs often if you need to stand in one place for a long time. Tighten and relax your muscles in your legs while you are standing.  Do not drive or operate heavy machinery if you feel dizzy.  Avoid bending down if you feel dizzy. Place items in your home so that they are easy for you to reach without leaning over. Lifestyle  Do not use any tobacco products, including cigarettes, chewing tobacco, or electronic cigarettes. If you need help quitting, ask your health care provider.  Try to reduce your stress level, such as with yoga or meditation. Talk with your health care provider if you need help. General Instructions  Watch your dizziness for any changes.  Take medicines only as directed by your health care provider. Talk with your health care provider if you think that your dizziness is caused by a medicine that you are taking.  Tell a friend or a family member that you are feeling dizzy. If he or she notices any changes in your behavior, have this person call your health care provider.  Keep all follow-up visits as directed by your health care provider. This is important. SEEK MEDICAL CARE IF:  Your dizziness does not go away.  Your dizziness or light-headedness gets worse.  You feel nauseous.  You have reduced hearing.  You have new symptoms.  You  are unsteady on your feet or you feel like the room is spinning. SEEK IMMEDIATE MEDICAL CARE IF:  You vomit or have diarrhea and are unable to eat or drink anything.  You have problems talking, walking, swallowing, or using your arms, hands, or legs.  You feel generally  weak.  You are not thinking clearly or you have trouble forming sentences. It may take a friend or family member to notice this.  You have chest pain, abdominal pain, shortness of breath, or sweating.  Your vision changes.  You notice any bleeding.  You have a headache.  You have neck pain or a stiff neck.  You have a fever.   This information is not intended to replace advice given to you by your health care provider. Make sure you discuss any questions you have with your health care provider.   Document Released: 04/12/2001 Document Revised: 03/03/2015 Document Reviewed: 10/13/2014 Elsevier Interactive Patient Education [employer].       I personally performed the services described in this documentation, which was scribed in my presence. The recorded information has been reviewed and considered, and addended by me as needed.   Signed,   Merri Ray, MD [facility].  05/12/2016 11:00 PM

## 2016-05-10 NOTE — Patient Instructions (Addendum)
IF you received an x-ray today, you will receive an invoice from [facility]. Please contact [facility] at [phone] with questions or concerns regarding your invoice.   IF you received labwork today, you will receive an invoice from [employer]. Please contact [facility] at [phone] with questions or concerns regarding your invoice.   Our billing staff will not be able to assist you with questions regarding bills from these companies.  You will be contacted with the lab results as soon as they are available. The fastest way to get your results is to activate your My Chart account. Instructions are located on the last page of this paperwork. If you have not heard from [place] regarding the results in 2 weeks, please contact this office.     You appear to be overdue for follow up with cardiology - call them to schedule this:  [facility] [address] [place], [place] [address] Phone: [phone]  Cough can come from a number of causes. See foods to avoid below. You can also try an over the counter allegra or claritin for the allergies.  Dizziness may be from the Cialis dose. Avoid that medicine for now. Before we restart that medicine - at lower dose - would want to see all your symptoms improve.   Continue the Toprol and other blood pressure medicine at same dose for now, but if dizziness returns, we may need to hold one of these medications as well. Make sure you're drinking plenty of fluids throughout the day, and regular meals throughout the day to lessen chance of dizziness from those causes.  If any return of chest pain, dizziness, sweating, go to the emergency room or call 911.  Your blood count was borderline low, but should not be the cause of your dizziness. I would recommend you recheck this in the next 4-6 weeks to make sure that it is improved. Sooner if any persistent  dizziness.  Return to the clinic or go to the nearest emergency room if any of your symptoms worsen or new symptoms occur.  Nonspecific Chest Pain  Chest pain can be caused by many different conditions. There is always a chance that your pain could be related to something serious, such as a heart attack or a blood clot in your lungs. Chest pain can also be caused by conditions that are not life-threatening. If you have chest pain, it is very important to follow up with your health care provider. CAUSES  Chest pain can be caused by:  Heartburn.  Pneumonia or bronchitis.  Anxiety or stress.  Inflammation around your heart (pericarditis) or lung (pleuritis or pleurisy).  A blood clot in your lung.  A collapsed lung (pneumothorax). It can develop suddenly on its own (spontaneous pneumothorax) or from trauma to the chest.  Shingles infection (varicella-zoster virus).  Heart attack.  Damage to the bones, muscles, and cartilage that make up your chest wall. This can include:  Bruised bones due to injury.  Strained muscles or cartilage due to frequent or repeated coughing or overwork.  Fracture to one or more ribs.  Sore cartilage due to inflammation (costochondritis). RISK FACTORS  Risk factors for chest pain may include:  Activities that increase your risk for trauma or injury to your chest.  Respiratory infections or conditions that cause frequent coughing.  Medical conditions or overeating that can cause heartburn.  Heart disease or family history of heart disease.  Conditions or  health behaviors that increase your risk of developing a blood clot.  Having had chicken pox (varicella zoster). SIGNS AND SYMPTOMS Chest pain can feel like:  Burning or tingling on the surface of your chest or deep in your chest.  Crushing, pressure, aching, or squeezing pain.  Dull or sharp pain that is worse when you move, cough, or take a deep breath.  Pain that is also felt in your  back, neck, shoulder, or arm, or pain that spreads to any of these areas. Your chest pain may come and go, or it may stay constant. DIAGNOSIS Lab tests or other studies may be needed to find the cause of your pain. Your health care provider may have you take a test called an ambulatory ECG (electrocardiogram). An ECG records your heartbeat patterns at the time the test is performed. You may also have other tests, such as:  Transthoracic echocardiogram (TTE). During echocardiography, sound waves are used to create a picture of all of the heart structures and to look at how blood flows through your heart.  Transesophageal echocardiogram (TEE).This is a more advanced imaging test that obtains images from inside your body. It allows your health care provider to see your heart in finer detail.  Cardiac monitoring. This allows your health care provider to monitor your heart rate and rhythm in real time.  Holter monitor. This is a portable device that records your heartbeat and can help to diagnose abnormal heartbeats. It allows your health care provider to track your heart activity for several days, if needed.  Stress tests. These can be done through exercise or by taking medicine that makes your heart beat more quickly.  Blood tests.  Imaging tests. TREATMENT  Your treatment depends on what is causing your chest pain. Treatment may include:  Medicines. These may include:  Acid blockers for heartburn.  Anti-inflammatory medicine.  Pain medicine for inflammatory conditions.  Antibiotic medicine, if an infection is present.  Medicines to dissolve blood clots.  Medicines to treat coronary artery disease.  Supportive care for conditions that do not require medicines. This may include:  Resting.  Applying heat or cold packs to injured areas.  Limiting activities until pain decreases. HOME CARE INSTRUCTIONS  If you were prescribed an antibiotic medicine, finish it all even if you  start to feel better.  Avoid any activities that bring on chest pain.  Do not use any tobacco products, including cigarettes, chewing tobacco, or electronic cigarettes. If you need help quitting, ask your health care provider.  Do not drink alcohol.  Take medicines only as directed by your health care provider.  Keep all follow-up visits as directed by your health care provider. This is important. This includes any further testing if your chest pain does not go away.  If heartburn is the cause for your chest pain, you may be told to keep your head raised (elevated) while sleeping. This reduces the chance that acid will go from your stomach into your esophagus.  Make lifestyle changes as directed by your health care provider. These may include:  Getting regular exercise. Ask your health care provider to suggest some activities that are safe for you.  Eating a heart-healthy diet. A registered dietitian can help you to learn healthy eating options.  Maintaining a healthy weight.  Managing diabetes, if necessary.  Reducing stress. SEEK MEDICAL CARE IF:  Your chest pain does not go away after treatment.  You have a rash with blisters on your chest.  You have  a fever. SEEK IMMEDIATE MEDICAL CARE IF:   Your chest pain is worse.  You have an increasing cough, or you cough up blood.  You have severe abdominal pain.  You have severe weakness.  You faint.  You have chills.  You have sudden, unexplained chest discomfort.  You have sudden, unexplained discomfort in your arms, back, neck, or jaw.  You have shortness of breath at any time.  You suddenly start to sweat, or your skin gets clammy.  You feel nauseous or you vomit.  You suddenly feel light-headed or dizzy.  Your heart begins to beat quickly, or it feels like it is skipping beats. These symptoms may represent a serious problem that is an emergency. Do not wait to see if the symptoms will go away. Get medical  help right away. Call your local emergency services (911 in the [place] Do not drive yourself to the hospital.   This information is not intended to replace advice given to you by your health care provider. Make sure you discuss any questions you have with your health care provider.   Document Released: 07/27/2005 Document Revised: 11/07/2014 Document Reviewed: 05/23/2014 Elsevier Interactive Patient Education 2016 [place] for Gastroesophageal Reflux Disease, Adult When you have gastroesophageal reflux disease (GERD), the foods you eat and your eating habits are very important. Choosing the right foods can help ease the discomfort of GERD. WHAT GENERAL GUIDELINES DO I NEED TO FOLLOW?  Choose fruits, vegetables, whole grains, low-fat dairy products, and low-fat meat, fish, and poultry.  Limit fats such as oils, salad dressings, butter, nuts, and avocado.  Keep a food diary to identify foods that cause symptoms.  Avoid foods that cause reflux. These may be different for different people.  Eat frequent small meals instead of three large meals each day.  Eat your meals slowly, in a relaxed setting.  Limit fried foods.  Cook foods using methods other than frying.  Avoid drinking alcohol.  Avoid drinking large amounts of liquids with your meals.  Avoid bending over or lying down until 2-3 hours after eating. WHAT FOODS ARE NOT RECOMMENDED? The following are some foods and drinks that may worsen your symptoms: Vegetables Tomatoes. Tomato juice. Tomato and spaghetti sauce. Chili peppers. Onion and garlic. Horseradish. Fruits Oranges, grapefruit, and lemon (fruit and juice). Meats High-fat meats, fish, and poultry. This includes hot dogs, ribs, ham, sausage, salami, and bacon. Dairy Whole milk and chocolate milk. Sour cream. Cream. Butter. Ice cream. Cream cheese.  Beverages Coffee and tea, with or without caffeine. Carbonated beverages or energy  drinks. Condiments Hot sauce. Barbecue sauce.  Sweets/Desserts Chocolate and cocoa. Donuts. Peppermint and spearmint. Fats and Oils High-fat foods, including [place] fries and potato chips. Other Vinegar. Strong spices, such as black pepper, white pepper, red pepper, cayenne, curry powder, cloves, ginger, and chili powder. The items listed above may not be a complete list of foods and beverages to avoid. Contact your dietitian for more information.   This information is not intended to replace advice given to you by your health care provider. Make sure you discuss any questions you have with your health care provider.   Document Released: 10/17/2005 Document Revised: 11/07/2014 Document Reviewed: 08/21/2013 Elsevier Interactive Patient Education 2016 Elsevier Inc. Cough, Adult Coughing is a reflex that clears your throat and your airways. Coughing helps to heal and protect your lungs. It is normal to cough occasionally, but a cough that happens with other symptoms or lasts a long  time may be a sign of a condition that needs treatment. A cough may last only 2-3 weeks (acute), or it may last longer than 8 weeks (chronic). CAUSES Coughing is commonly caused by:  Breathing in substances that irritate your lungs.  A viral or bacterial respiratory infection.  Allergies.  Asthma.  Postnasal drip.  Smoking.  Acid backing up from the stomach into the esophagus (gastroesophageal reflux).  Certain medicines.  Chronic lung problems, including COPD (or rarely, lung cancer).  Other medical conditions such as heart failure. HOME CARE INSTRUCTIONS  Pay attention to any changes in your symptoms. Take these actions to help with your discomfort:  Take medicines only as told by your health care provider.  If you were prescribed an antibiotic medicine, take it as told by your health care provider. Do not stop taking the antibiotic even if you start to feel better.  Talk with your health care  provider before you take a cough suppressant medicine.  Drink enough fluid to keep your urine clear or pale yellow.  If the air is dry, use a cold steam vaporizer or humidifier in your bedroom or your home to help loosen secretions.  Avoid anything that causes you to cough at work or at home.  If your cough is worse at night, try sleeping in a semi-upright position.  Avoid cigarette smoke. If you smoke, quit smoking. If you need help quitting, ask your health care provider.  Avoid caffeine.  Avoid alcohol.  Rest as needed. SEEK MEDICAL CARE IF:   You have new symptoms.  You cough up pus.  Your cough does not get better after 2-3 weeks, or your cough gets worse.  You cannot control your cough with suppressant medicines and you are losing sleep.  You develop pain that is getting worse or pain that is not controlled with pain medicines.  You have a fever.  You have unexplained weight loss.  You have night sweats. SEEK IMMEDIATE MEDICAL CARE IF:  You cough up blood.  You have difficulty breathing.  Your heartbeat is very fast.   This information is not intended to replace advice given to you by your health care provider. Make sure you discuss any questions you have with your health care provider.   Document Released: 04/15/2011 Document Revised: 07/08/2015 Document Reviewed: 12/24/2014 Elsevier Interactive Patient Education 2016 Elsevier Inc.  Dizziness Dizziness is a common problem. It is a feeling of unsteadiness or light-headedness. You may feel like you are about to faint. Dizziness can lead to injury if you stumble or fall. Anyone can become dizzy, but dizziness is more common in older adults. This condition can be caused by a number of things, including medicines, dehydration, or illness. HOME CARE INSTRUCTIONS Taking these steps may help with your condition: Eating and Drinking  Drink enough fluid to keep your urine clear or pale yellow. This helps to keep you  from becoming dehydrated. Try to drink more clear fluids, such as water.  Do not drink alcohol.  Limit your caffeine intake if directed by your health care provider.  Limit your salt intake if directed by your health care provider. Activity  Avoid making quick movements.  Rise slowly from chairs and steady yourself until you feel okay.  In the morning, first sit up on the side of the bed. When you feel okay, stand slowly while you hold onto something until you know that your balance is fine.  Move your legs often if you need to stand in  one place for a long time. Tighten and relax your muscles in your legs while you are standing.  Do not drive or operate heavy machinery if you feel dizzy.  Avoid bending down if you feel dizzy. Place items in your home so that they are easy for you to reach without leaning over. Lifestyle  Do not use any tobacco products, including cigarettes, chewing tobacco, or electronic cigarettes. If you need help quitting, ask your health care provider.  Try to reduce your stress level, such as with yoga or meditation. Talk with your health care provider if you need help. General Instructions  Watch your dizziness for any changes.  Take medicines only as directed by your health care provider. Talk with your health care provider if you think that your dizziness is caused by a medicine that you are taking.  Tell a friend or a family member that you are feeling dizzy. If he or she notices any changes in your behavior, have this person call your health care provider.  Keep all follow-up visits as directed by your health care provider. This is important. SEEK MEDICAL CARE IF:  Your dizziness does not go away.  Your dizziness or light-headedness gets worse.  You feel nauseous.  You have reduced hearing.  You have new symptoms.  You are unsteady on your feet or you feel like the room is spinning. SEEK IMMEDIATE MEDICAL CARE IF:  You vomit or have  diarrhea and are unable to eat or drink anything.  You have problems talking, walking, swallowing, or using your arms, hands, or legs.  You feel generally weak.  You are not thinking clearly or you have trouble forming sentences. It may take a friend or family member to notice this.  You have chest pain, abdominal pain, shortness of breath, or sweating.  Your vision changes.  You notice any bleeding.  You have a headache.  You have neck pain or a stiff neck.  You have a fever.   This information is not intended to replace advice given to you by your health care provider. Make sure you discuss any questions you have with your health care provider.   Document Released: 04/12/2001 Document Revised: 03/03/2015 Document Reviewed: 10/13/2014 Elsevier Interactive Patient Education [employer].

## 2016-05-11 LAB — COMPLETE METABOLIC PANEL WITH GFR
ALBUMIN: 4.2 g/dL (ref 3.6–5.1)
ALT: 30 U/L (ref 9–46)
AST: 24 U/L (ref 10–35)
Alkaline Phosphatase: 38 U/L — ABNORMAL LOW (ref 40–115)
BUN: 15 mg/dL (ref 7–25)
CHLORIDE: 104 mmol/L (ref 98–110)
CO2: 26 mmol/L (ref 20–31)
Calcium: 9.1 mg/dL (ref 8.6–10.3)
Creat: 1.06 mg/dL (ref 0.70–1.33)
GFR, Est African American: 88 mL/min (ref >=60)
GFR, Est Non African American: 76 mL/min (ref >=60)
GLUCOSE: 85 mg/dL (ref 65–99)
POTASSIUM: 4 mmol/L (ref 3.5–5.3)
SODIUM: 139 mmol/L (ref 135–146)
Total Bilirubin: 0.6 mg/dL (ref 0.2–1.2)
Total Protein: 6.9 g/dL (ref 6.1–8.1)

## 2016-05-11 LAB — LIPID PANEL
CHOL/HDL RATIO: 5.4 ratio — AB (ref <=5.0)
Cholesterol: 228 mg/dL — ABNORMAL HIGH (ref 125–200)
HDL: 42 mg/dL (ref >=40)
LDL Cholesterol: 157 mg/dL — ABNORMAL HIGH (ref <130)
Triglycerides: 146 mg/dL (ref <150)
VLDL: 29 mg/dL (ref <30)

## 2016-05-24 ENCOUNTER — Encounter: Payer: Self-pay | Admitting: Emergency Medicine

## 2016-07-11 NOTE — Telephone Encounter (Signed)
Hey Dawn, Can you get this pt scheduled for his HST? It was ordered on 02/01/2016.  Thanks!

## 2016-07-11 NOTE — Telephone Encounter (Signed)
Patient is calling to discuss having a Home Sleep test. The patient never had one.

## 2016-07-12 ENCOUNTER — Ambulatory Visit (INDEPENDENT_AMBULATORY_CARE_PROVIDER_SITE_OTHER): Payer: BLUE CROSS/BLUE SHIELD | Admitting: Family Medicine

## 2016-07-12 ENCOUNTER — Encounter: Payer: Self-pay | Admitting: Family Medicine

## 2016-07-12 VITALS — BP 114/62 | HR 85 | Temp 97.3°F | Resp 16 | Ht 73.0 in | Wt 242.0 lb

## 2016-07-12 DIAGNOSIS — D225 Melanocytic nevi of trunk: Secondary | ICD-10-CM

## 2016-07-12 DIAGNOSIS — M7022 Olecranon bursitis, left elbow: Secondary | ICD-10-CM | POA: Diagnosis not present

## 2016-07-12 NOTE — Patient Instructions (Addendum)
Avoid overuse of your left elbow for the next 2 weeks, Ace bandage during that time if possible. If any redness, swelling or pus from the area, return right away.  I referred you to dermatology for the area on your back.    Elbow Bursitis Elbow bursitis is inflammation of the fluid-filled sac (bursa) between the tip of your elbow bone (olecranon) and your skin. Elbow bursitis may also be called olecranon bursitis. Normally, the olecranon bursa has only a small amount of fluid in it to cushion and protect your elbow bone. Elbow bursitis causes fluid to build up inside the bursa. Over time, this swelling and inflammation can cause pain when you bend or lean on your elbow.  CAUSES Elbow bursitis may be caused by:   Elbow injury (acute trauma).  Leaning on hard surfaces for long periods of time.  Infection from an injury that breaks the skin near your elbow.  A bone growth (spur) that forms at the tip of your elbow.  A medical condition that causes inflammation in your body, such as gout or rheumatoid arthritis.  The cause may also be unknown.  SIGNS AND SYMPTOMS  The first sign of elbow bursitis is usually swelling over the tip of your elbow. This can grow to be the size of a golf ball. This may start suddenly or develop gradually. You may also have:  Pain when bending or leaning on your elbow.  Restricted movement of your elbow.  If your bursitis is caused by an infection, symptoms may also include:  Redness, warmth, and tenderness of the elbow.  Drainage of pus from the swollen area over your elbow, if the skin breaks open. DIAGNOSIS  Your health care provider may be able to diagnose elbow bursitis based on your signs and symptoms, especially if you have recently been injured. Your health care provider will also do a physical exam. This may include:  X-rays to look for a bone spur or a bone fracture.  Draining fluid from the bursa to test it for infection.  Blood tests to rule  out gout or rheumatoid arthritis. TREATMENT  Treatment for elbow bursitis depends on the cause. Treatment may include:  Medicines. These may include:  Over-the-counter medicines to relieve pain and inflammation.  Antibiotic medicines to fight infection.  Injections of anti-inflammatory medicines (steroids).  Wrapping your elbow with a bandage.  Draining fluid from the bursa.  Wearing elbow pads.  If your bursitis does not get better with treatment, surgery may be needed to remove the bursa.  HOME CARE INSTRUCTIONS   Take medicines only as directed by your health care provider.  If you were prescribed an antibiotic medicine, finish all of it even if you start to feel better.  If your bursitis is caused by an injury, rest your elbow and wear your bandage as directed by your health care provider. You may alsoapply ice to the injured area as directed by your health care provider:  Put ice in a plastic bag.  Place a towel between your skin and the bag.  Leave the ice on for 20 minutes, 2-3 times per day.  Avoid any activities that cause elbow pain.  Use elbow pads or elbow wraps to cushion your elbow. SEEK MEDICAL CARE IF:  You have a fever.   Your symptoms do not get better with treatment.  Your pain or swelling gets worse.  Your elbow pain or swelling goes away and then returns.  You have drainage of pus from the  swollen area over your elbow.   This information is not intended to replace advice given to you by your health care provider. Make sure you discuss any questions you have with your health care provider.   Document Released: 11/16/2006 Document Revised: 11/07/2014 Document Reviewed: 06/25/2014 Elsevier Interactive Patient Education 2016 [employer].     IF you received an x-ray today, you will receive an invoice from [facility]. Please contact [facility] at [phone] with questions or concerns regarding your invoice.   IF you  received labwork today, you will receive an invoice from [employer]. Please contact [facility] at [phone] with questions or concerns regarding your invoice.   Our billing staff will not be able to assist you with questions regarding bills from these companies.  You will be contacted with the lab results as soon as they are available. The fastest way to get your results is to activate your My Chart account. Instructions are located on the last page of this paperwork. If you have not heard from [place] regarding the results in 2 weeks, please contact this office.

## 2016-07-12 NOTE — Progress Notes (Signed)
Subjective:  By signing my name below, I, Moises Blood, attest that this documentation has been prepared under the direction and in the presence of Merri Ray, MD. Electronically Signed: Moises Blood, [place]. 07/12/2016 , 8:57 AM .  Patient was seen in Room 10 .   Patient ID: Shane Sawyer, male    DOB: 14-Dec-1955, 60 y.o.   MRN: [id number] Chief Complaint  Patient presents with   Joint Swelling    Left elbow swelling x 3 weeks    HPI Shane Sawyer is a 60 y.o. male Here for left elbow swelling noticed 3 weeks ago. Patient states he was crawling on his elbows and his knees with his granddaughter (45 years old) about a month ago. He felt discomfort, "similar to hitting his funny bone". He noticed the swelling over the left elbow about a week later with occasional warmth over the area. He notes occasional exercise. 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 He's right hand dominant.   He also complains having a dark spot over his mid-back. He tried digging it away by scratching it off himself but was unable to.   Patient Active Problem List   Diagnosis Date Noted   Rectal bleeding 09/21/2015   Change in bowel habits 09/21/2015   Special screening for malignant neoplasms, colon 09/21/2015   Tremor 09/10/2013   Unspecified essential hypertension 08/29/2013   Past Medical History:  Diagnosis Date   Erectile dysfunction    HBP (high blood pressure)    Past Surgical History:  Procedure Laterality Date   NOSE SURGERY     times 2   Allergies  Allergen Reactions   Ivp Dye [Iodinated Diagnostic Agents]     Swelling in throat, SOB   Penicillins    Sulfur    Prior to Admission medications   Medication Sig Start Date End Date Taking? Authorizing Provider  AMBULATORY NON FORMULARY MEDICATION Liquid silver nitrite  1 dose by mouth daily   Yes Historical Provider, MD  AMBULATORY NON [facility] Takes one tablet daily   Yes  Historical Provider, MD  AMBULATORY NON FORMULARY MEDICATION L-arginine Take one tablet daily   Yes Historical Provider, MD  APPLE CIDER VINEGAR PO Take 1 Dose by mouth daily.   Yes Historical Provider, MD  COD LIVER OIL PO Take 1 Dose by mouth daily.   Yes Historical Provider, MD  Coenzyme Q10 (CO Q-10 PO) Take 1 tablet by mouth daily.   Yes Historical Provider, MD  FOLIC ACID PO Take 1 tablet by mouth daily.   Yes Historical Provider, MD  gabapentin (NEURONTIN) 300 MG capsule Take 1 capsule (300 mg total) by mouth 3 (three) times daily. 02/26/16  Yes Tamala Fothergill Regal, DPM  GARLIC PO Take 1 tablet by mouth daily.   Yes Historical Provider, MD  losartan-hydrochlorothiazide (HYZAAR) 100-25 MG tablet Take 1 tablet by mouth daily. 05/10/16  Yes [name], MD  metoprolol succinate (TOPROL-XL) 50 MG 24 hr tablet Take 1 tablet (50 mg total) by mouth daily. Take with or immediately following a meal. 05/10/16  Yes [name], MD  MILK THISTLE PO Take 1 tablet by mouth daily.   Yes Historical Provider, MD  NONFORMULARY OR COMPOUNDED Citrus compounding:  Onychomycosis Nail Lacquer - Fluconazole 2%, Terbinafine 1%, DMSO. Apply daily to affected area. +11 refills. 03/07/16  Yes Wallene Huh, DPM  Omega-3 Fatty Acids (FISH OIL PO) Take 1 tablet by mouth daily.   Yes Historical  Provider, MD  omeprazole (PRILOSEC) 20 MG capsule Take 20 mg by mouth daily.   Yes Historical Provider, MD  Plant Sterols and Stanols (CHOLEST OFF PO) Take 1 tablet by mouth daily.   Yes Historical Provider, MD  tadalafil (CIALIS) 20 MG tablet Take 20 mg by mouth daily as needed for erectile dysfunction.   Yes Historical Provider, MD  thiamine (VITAMIN B-1) 100 MG tablet Take 100 mg by mouth daily.   Yes Historical Provider, MD  Niacin (VITAMIN B-3 PO) Take 1 tablet by mouth daily. Reported on 05/10/2016    Historical Provider, MD  terbinafine (LAMISIL) 250 MG tablet Take 1 tablet (250 mg total) by mouth  daily. Patient not taking: Reported on 07/12/2016 02/26/16   Wallene Huh, DPM   Social History   Social History   Marital status: Married    Spouse name: N/A   Number of children: 1   Years of education: 12+   Occupational History   Not on file.   Social History Main Topics   Smoking status: Never Smoker   Smokeless tobacco: Never Used   Alcohol use 1.2 oz/week    2 Cans of beer per week     Comment: weekly    Drug use: No   Sexual activity: Not on file   Other Topics Concern   Not on file   Social History Narrative   Patient lives at home girlfriend [name].   Patient has no children but one the way.    Patient is currently working, he buys cars in sell them.   Patient has some college.   Review of Systems  Constitutional: Negative for chills, fatigue and fever.  Gastrointestinal: Negative for diarrhea, nausea and vomiting.  Musculoskeletal: Positive for arthralgias and joint swelling. Negative for back pain, myalgias, neck pain and neck stiffness.  Neurological: Negative for weakness and numbness.       Objective:   Physical Exam  Constitutional: He is oriented to person, place, and time. He appears well-developed and well-nourished. No distress.  HENT:  Head: Normocephalic and atraumatic.  Eyes: EOM are normal. Pupils are equal, round, and reactive to light.  Neck: Neck supple.  Cardiovascular: Normal rate.   Pulmonary/Chest: Effort normal. No respiratory distress.  Musculoskeletal: Normal range of motion.  Left elbow: full rom, skin intact, no erythema, some focal swelling at the olecranon without significant warmth  Neurological: He is alert and oriented to person, place, and time.  Skin: Skin is warm and dry.  Slight hyperpigmented lesion over his mid-back, measuring about 1cm x 1.2cm across with punctate black dots  Psychiatric: He has a normal mood and affect. His behavior is normal.  Nursing note and vitals reviewed.   Vitals:    07/12/16 0827  BP: 114/62  Pulse: 85  Resp: 16  Temp: 97.3 F (36.3 C)  TempSrc: Oral  SpO2: 97%  Weight: 242 lb (109.8 kg)  Height: 6\' 1"  (1.854 m)   Procedure:  Removed 16cc Serous sanguinous fluid from his left elbow.   Risks (including but not limited to bleeding and infection, damage to underlying structures), benefits, and alternatives discussed for left olecranon bursa aspiration  Verbal consent obtained after any questions were answered. Landmarks noted, and marked as needed. Area cleansed with Betadine x2, ethyl chloride spray for topical anesthesia, followed by alcohol swab.  16 mL serosanguineous fluid aspirated without difficulty. Flattening of bursa noted.  No complications. Bandage applied.  RTC precautions discussed in regards to injection.  Assessment & Plan:   Shane Sawyer is a 60 y.o. male Nevus of back - Plan: Ambulatory referral to Dermatology  -Possible seborrheic keratosis, but with hyperpigmentation and new nature of lesion, will refer to dermatology for evaluation.  Olecranon bursitis of left elbow  - Likely traumatic due to "elbow walking". Aspirated as above without complication, aftercare and RTC precautions discussed.   No orders of the defined types were placed in this encounter.  Patient Instructions   Avoid overuse of your left elbow for the next 2 weeks, Ace bandage during that time if possible. If any redness, swelling or pus from the area, return right away.  I referred you to dermatology for the area on your back.    Elbow Bursitis Elbow bursitis is inflammation of the fluid-filled sac (bursa) between the tip of your elbow bone (olecranon) and your skin. Elbow bursitis may also be called olecranon bursitis. Normally, the olecranon bursa has only a small amount of fluid in it to cushion and protect your elbow bone. Elbow bursitis causes fluid to build up inside the bursa. Over time, this swelling and inflammation can cause pain when you bend or  lean on your elbow.  CAUSES Elbow bursitis may be caused by:   Elbow injury (acute trauma).  Leaning on hard surfaces for long periods of time.  Infection from an injury that breaks the skin near your elbow.  A bone growth (spur) that forms at the tip of your elbow.  A medical condition that causes inflammation in your body, such as gout or rheumatoid arthritis.  The cause may also be unknown.  SIGNS AND SYMPTOMS  The first sign of elbow bursitis is usually swelling over the tip of your elbow. This can grow to be the size of a golf ball. This may start suddenly or develop gradually. You may also have:  Pain when bending or leaning on your elbow.  Restricted movement of your elbow.  If your bursitis is caused by an infection, symptoms may also include:  Redness, warmth, and tenderness of the elbow.  Drainage of pus from the swollen area over your elbow, if the skin breaks open. DIAGNOSIS  Your health care provider may be able to diagnose elbow bursitis based on your signs and symptoms, especially if you have recently been injured. Your health care provider will also do a physical exam. This may include:  X-rays to look for a bone spur or a bone fracture.  Draining fluid from the bursa to test it for infection.  Blood tests to rule out gout or rheumatoid arthritis. TREATMENT  Treatment for elbow bursitis depends on the cause. Treatment may include:  Medicines. These may include:  Over-the-counter medicines to relieve pain and inflammation.  Antibiotic medicines to fight infection.  Injections of anti-inflammatory medicines (steroids).  Wrapping your elbow with a bandage.  Draining fluid from the bursa.  Wearing elbow pads.  If your bursitis does not get better with treatment, surgery may be needed to remove the bursa.  HOME CARE INSTRUCTIONS   Take medicines only as directed by your health care provider.  If you were prescribed an antibiotic medicine, finish all  of it even if you start to feel better.  If your bursitis is caused by an injury, rest your elbow and wear your bandage as directed by your health care provider. You may alsoapply ice to the injured area as directed by your health care provider:  Put ice in a plastic bag.  Place a towel  between your skin and the bag.  Leave the ice on for 20 minutes, 2-3 times per day.  Avoid any activities that cause elbow pain.  Use elbow pads or elbow wraps to cushion your elbow. SEEK MEDICAL CARE IF:  You have a fever.   Your symptoms do not get better with treatment.  Your pain or swelling gets worse.  Your elbow pain or swelling goes away and then returns.  You have drainage of pus from the swollen area over your elbow.   This information is not intended to replace advice given to you by your health care provider. Make sure you discuss any questions you have with your health care provider.   Document Released: 11/16/2006 Document Revised: 11/07/2014 Document Reviewed: 06/25/2014 Elsevier Interactive Patient Education 2016 [employer].     IF you received an x-ray today, you will receive an invoice from [facility]. Please contact [facility] at [phone] with questions or concerns regarding your invoice.   IF you received labwork today, you will receive an invoice from [employer]. Please contact [facility] at [phone] with questions or concerns regarding your invoice.   Our billing staff will not be able to assist you with questions regarding bills from these companies.  You will be contacted with the lab results as soon as they are available. The fastest way to get your results is to activate your My Chart account. Instructions are located on the last page of this paperwork. If you have not heard from [place] regarding the results in 2 weeks, please contact this office.       I personally performed the services described in this  documentation, which was scribed in my presence. The recorded information has been reviewed and considered, and addended by me as needed.   Signed,   Merri Ray, MD [facility].  07/12/16 9:18 AM

## 2016-08-22 ENCOUNTER — Ambulatory Visit (INDEPENDENT_AMBULATORY_CARE_PROVIDER_SITE_OTHER): Payer: BLUE CROSS/BLUE SHIELD

## 2016-08-22 ENCOUNTER — Ambulatory Visit (INDEPENDENT_AMBULATORY_CARE_PROVIDER_SITE_OTHER): Payer: BLUE CROSS/BLUE SHIELD | Admitting: Podiatry

## 2016-08-22 ENCOUNTER — Encounter: Payer: Self-pay | Admitting: Podiatry

## 2016-08-22 VITALS — BP 127/78 | HR 66 | Resp 16

## 2016-08-22 DIAGNOSIS — M779 Enthesopathy, unspecified: Secondary | ICD-10-CM

## 2016-08-22 DIAGNOSIS — M79672 Pain in left foot: Secondary | ICD-10-CM | POA: Diagnosis not present

## 2016-08-22 DIAGNOSIS — G629 Polyneuropathy, unspecified: Secondary | ICD-10-CM | POA: Diagnosis not present

## 2016-08-22 DIAGNOSIS — M79671 Pain in right foot: Secondary | ICD-10-CM

## 2016-08-22 MED ORDER — PREDNISONE 10 MG PO TABS: ORAL_TABLET | ORAL | 0 refills | Status: DC

## 2016-08-22 MED ORDER — TRIAMCINOLONE ACETONIDE 10 MG/ML IJ SUSP
10.0000 mg | Freq: Once | INTRAMUSCULAR | Status: AC
  Administered 2016-08-22: 10 mg

## 2016-08-22 NOTE — Progress Notes (Signed)
Subjective:     Patient ID: Shane Sawyer, male   DOB: [birth year], 60 y.o.   MRN: [id number]  HPI patient presents stating that the top of both feet are really bothering him and he gets burning in both feet that the heels seem better after we treated them. States the pain in the top is the worse it's been and he admits she's not been taking his gabapentin as he was supposed   Review of Systems     Objective:   Physical Exam Neurovascular status intact muscle strength adequate and inflammation pain in the dorsum of both feet within the midtarsal and Lisfranc's joints bilateral with minimal discomfort plantar heel or outside of both feet    Assessment:     Possibility for neuropathic changes versus dorsal tendinitis with possible arthritis    Plan:     Discussed if symptoms do not improve we may need to consider CT scan and today I injected the dorsal complex bilateral 3 mg Kenalog 5 mg Xylocaine and gave advised on heat ice therapy placed on Sterapred 12 day DS Dosepak to be followed by gabapentin 3 times a day and reappoint in 6 weeks or earlier if needed  X-rays indicate there may be some inflammatory changes or possible arthritis of the midtarsal joint bilateral

## 2016-08-31 ENCOUNTER — Telehealth: Payer: Self-pay | Admitting: Neurology

## 2016-08-31 DIAGNOSIS — G4719 Other hypersomnia: Secondary | ICD-10-CM

## 2016-08-31 DIAGNOSIS — G4733 Obstructive sleep apnea (adult) (pediatric): Secondary | ICD-10-CM

## 2016-08-31 NOTE — Telephone Encounter (Signed)
Patient has a split-night polysomnography order since March 2017. This order is now expired. I filed for a new split-night polysomnography study order. CD

## 2016-08-31 NOTE — Telephone Encounter (Signed)
Patient called to schedule sleep study but I don't have an order for the sleep study.  Will you please give me an order?  Thanks

## 2016-09-20 ENCOUNTER — Encounter (INDEPENDENT_AMBULATORY_CARE_PROVIDER_SITE_OTHER): Payer: BLUE CROSS/BLUE SHIELD | Admitting: Neurology

## 2016-09-20 DIAGNOSIS — G4710 Hypersomnia, unspecified: Secondary | ICD-10-CM | POA: Diagnosis not present

## 2016-09-27 ENCOUNTER — Other Ambulatory Visit: Payer: Self-pay

## 2016-09-27 ENCOUNTER — Telehealth: Payer: Self-pay

## 2016-09-27 DIAGNOSIS — R0683 Snoring: Secondary | ICD-10-CM

## 2016-09-27 DIAGNOSIS — G4733 Obstructive sleep apnea (adult) (pediatric): Secondary | ICD-10-CM

## 2016-09-27 NOTE — Telephone Encounter (Signed)
Referral and cpap order sent to [facility]. Received a receipt of confirmation.

## 2016-09-27 NOTE — Telephone Encounter (Signed)
I spoke to pt. I advised him that his HST revealed severe apnea with evidence of tachycardia and prolonged oxygen desaturation and that CPAP is the recommended therapy. Pt is agreeable to starting a cpap. Dr. Brett Fairy has recommended an autotitration cpap 5-15 cm H2O. I advised pt that I would send this order to a DME and they will call pt within a week to set it up. Will send to [facility]. A follow up appt was made for 11/28/16 at 8:00 to discuss with Dr. Brett Fairy. Pt verbalized understanding of results. Pt had no questions at this time but was encouraged to call back if questions arise.  Order for auto cpap 5-15 not placed. Will send to Dr. Brett Fairy for review.

## 2016-10-03 ENCOUNTER — Encounter: Payer: Self-pay | Admitting: Podiatry

## 2016-10-03 ENCOUNTER — Ambulatory Visit (INDEPENDENT_AMBULATORY_CARE_PROVIDER_SITE_OTHER): Payer: BLUE CROSS/BLUE SHIELD | Admitting: Podiatry

## 2016-10-03 DIAGNOSIS — L600 Ingrowing nail: Secondary | ICD-10-CM | POA: Diagnosis not present

## 2016-10-03 DIAGNOSIS — M722 Plantar fascial fibromatosis: Secondary | ICD-10-CM

## 2016-10-03 NOTE — Patient Instructions (Signed)

## 2016-10-05 NOTE — Progress Notes (Signed)
Subjective:     Patient ID: Shane Sawyer, male   DOB: [birth year], 60 y.o.   MRN: [id number]  HPI patient presents stating that he gets a lot of tightness in his arches especially after he been on his foot at work all day and he's got an ingrown toenail his right big toe that he tries to do gout but it's not been successful. 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 I infiltrated the right hallux 60 mg Xylocaine Marcaine mixture remove the lateral border exposed matrix and applied phenol 3 applications 30 seconds followed by alcohol lavage and sterile dressing. The patient should have any symptoms associated with a Lamisil he had taken previously he is to let us know but he did stop 2 weeks ago and states she's not had any issues. I advised him against drinking any alcohol at this time

## 2016-11-01 ENCOUNTER — Telehealth: Payer: Self-pay | Admitting: *Deleted

## 2016-11-01 NOTE — Telephone Encounter (Signed)
Called patient at [phone] (Cell #) to check to see how they were doing from their ingrown toenail procedure that was performed on Monday, October 03, 2016. Pt stated, "Doing okay and not in any pain".

## 2016-11-08 ENCOUNTER — Other Ambulatory Visit: Payer: Self-pay | Admitting: Family Medicine

## 2016-11-08 DIAGNOSIS — I10 Essential (primary) hypertension: Secondary | ICD-10-CM

## 2016-11-28 ENCOUNTER — Ambulatory Visit: Payer: Self-pay | Admitting: Neurology

## 2016-12-05 ENCOUNTER — Other Ambulatory Visit: Payer: Self-pay | Admitting: Family Medicine

## 2016-12-05 DIAGNOSIS — I10 Essential (primary) hypertension: Secondary | ICD-10-CM

## 2016-12-07 ENCOUNTER — Telehealth: Payer: Self-pay

## 2016-12-07 DIAGNOSIS — I10 Essential (primary) hypertension: Secondary | ICD-10-CM

## 2016-12-07 NOTE — Telephone Encounter (Signed)
Pt states that the refill on his metoprolol but he is leaving to go out of town and cant come in for an appt and needs enough until he returns   Best number [phone]

## 2016-12-08 ENCOUNTER — Ambulatory Visit (INDEPENDENT_AMBULATORY_CARE_PROVIDER_SITE_OTHER): Payer: BLUE CROSS/BLUE SHIELD | Admitting: Family Medicine

## 2016-12-08 ENCOUNTER — Encounter: Payer: Self-pay | Admitting: Family Medicine

## 2016-12-08 VITALS — BP 134/82 | HR 88 | Temp 98.0°F | Resp 18 | Ht 73.0 in | Wt 252.0 lb

## 2016-12-08 DIAGNOSIS — R0789 Other chest pain: Secondary | ICD-10-CM | POA: Diagnosis not present

## 2016-12-08 DIAGNOSIS — R609 Edema, unspecified: Secondary | ICD-10-CM | POA: Diagnosis not present

## 2016-12-08 DIAGNOSIS — R0609 Other forms of dyspnea: Secondary | ICD-10-CM | POA: Diagnosis not present

## 2016-12-08 DIAGNOSIS — E785 Hyperlipidemia, unspecified: Secondary | ICD-10-CM

## 2016-12-08 DIAGNOSIS — I10 Essential (primary) hypertension: Secondary | ICD-10-CM | POA: Diagnosis not present

## 2016-12-08 DIAGNOSIS — Z9989 Dependence on other enabling machines and devices: Secondary | ICD-10-CM | POA: Diagnosis not present

## 2016-12-08 DIAGNOSIS — G4733 Obstructive sleep apnea (adult) (pediatric): Secondary | ICD-10-CM

## 2016-12-08 DIAGNOSIS — K649 Unspecified hemorrhoids: Secondary | ICD-10-CM

## 2016-12-08 MED ORDER — METOPROLOL SUCCINATE ER 50 MG PO TB24: 50.0000 mg | ORAL_TABLET | Freq: Every day | ORAL | 0 refills | Status: DC

## 2016-12-08 MED ORDER — METOPROLOL SUCCINATE ER 50 MG PO TB24: 50.0000 mg | ORAL_TABLET | Freq: Every day | ORAL | 1 refills | Status: DC

## 2016-12-08 MED ORDER — LOSARTAN POTASSIUM-HCTZ 100-25 MG PO TABS: 1.0000 | ORAL_TABLET | Freq: Every day | ORAL | 1 refills | Status: DC

## 2016-12-08 NOTE — Progress Notes (Addendum)
By signing my name below, I, Mesha Guinyard, attest that this documentation has been prepared under the direction and in the presence of Merri Ray, MD.  Electronically Signed: Verlee Monte, Medical Scribe. 12/08/16. 12:17 PM.  Subjective:    Patient ID: Shane Sawyer, male    DOB: May 04, 1956, 61 y.o.   MRN: [id number]  HPI Chief Complaint  Patient presents with  . Other    wants blood work to check levels since he is on medication. does not need refills    HPI Comments: Shane Sawyer is a 61 y.o. male who presents to the [facility] for blood work. Pt is not fasting and last ate breakfast at 9 am today.  HTN: He's on losartan-HCTZ 100/25 mg QD, toprol-xl 50 mg QD. BP ranges around 117/68 and his pulse is around 68. Pt missed a dose of toprol-xl yesterday. Reports right ankle/foot and finger edema for the "past couple of days" - he couldn't fit his ring. He also mentions his hands have been aching. Pt continues to go to podiatry for plantar fascitis. Pt had 4-6 drinks during the super bowl with wings and vegetables -  He nl doesn't swell after the super bowl. Denies light-headedness Lab Results  Component Value Date   CREATININE 1.06 05/10/2016   BP Readings from Last 3 Encounters:  12/08/16 134/82  08/22/16 127/78  07/12/16 114/62   Essential Tremor: Evaluated by Dr. Krista Blue in 2014.  Snoring and Fatigue: Evaluated in March 2017 by Dr. Brett Fairy. Thought to be due to irregular sleep patterns and risk factors of OSA. Split study initially ordered in Nov, but he had a home sleep test Nov 28th with an AHI of 40. Noted to have severe sleep apnea and CPAP was ordered. Echo in Nov 2016 EF of 55-60%. Low risk stress testing at that time. Reports some fatigue and frequent intermittent becoming SOB with faint chest tightness with daily activities such as going up stairs. Pt suspects his SOB is from being out of shape since it doesn't always happen. Pt is compliant with his  CPAP but it doesn't work as well as it used to. He was initially relieved of his sxs with CPAP, but he now struggles to get a breath - his seal is fine. He reports every echo he had was good. Pt never smoked.  HLD: He had an intolerance to statins in the past, but advised to return to discuss options due to elevated 10 year heart disease risk - calculated risk was 11.9 % at last visit. Pt was taking cholest off  otc supplement and increased his fiber for his cholesterol, but he hasn't been consistent recently -  discontinued last week and wasn't taking it during July labs.  Reports his cholesterol lowered 100 points when he stopped drinking, started eating healthy, and exercised regularly, along with supplement.  Notes crestor and lipitor gave him muscle weakness. Pt plans on compliance with cholest off supplements, increasing his fiber intake, exercise regularly, and eat healthy foods so he can measure the difference in the next 3 months with his follow-up. Lab Results  Component Value Date   CHOL 228 (H) 05/10/2016   HDL 42 05/10/2016   LDLCALC 157 (H) 05/10/2016   TRIG 146 05/10/2016   CHOLHDL 5.4 (H) 05/10/2016   Lab Results  Component Value Date   ALT 30 05/10/2016   AST 24 05/10/2016   ALKPHOS 38 (L) 05/10/2016   BILITOT 0.6 05/10/2016   Rectal Bleeding: Reports external  hemrroids with intermittent bright red blood from his rectum. He's had it since he was 61 y/o but it's more frequent than it used to. He saw a GI and was told it was okay. Improving now.   Patient Active Problem List   Diagnosis Date Noted  . Rectal bleeding 09/21/2015  . Change in bowel habits 09/21/2015  . Special screening for malignant neoplasms, colon 09/21/2015  . Tremor 09/10/2013  . Unspecified essential hypertension 08/29/2013   Past Medical History:  Diagnosis Date  . Erectile dysfunction   . HBP (high blood pressure)    Past Surgical History:  Procedure Laterality Date  . NOSE SURGERY     times 2    Allergies  Allergen Reactions  . Ivp Dye [Iodinated Diagnostic Agents]     Swelling in throat, SOB  . Penicillins   . Sulfur    Prior to Admission medications   Medication Sig Start Date End Date Taking? Authorizing Provider  AMBULATORY NON FORMULARY MEDICATION Emergency B complex Takes one tablet daily   Yes Historical Provider, MD  AMBULATORY NON FORMULARY MEDICATION L-arginine Take one tablet daily   Yes Historical Provider, MD  APPLE CIDER VINEGAR PO Take 1 Dose by mouth daily.   Yes Historical Provider, MD  COD LIVER OIL PO Take 1 Dose by mouth daily.   Yes Historical Provider, MD  Coenzyme Q10 (CO Q-10 PO) Take 1 tablet by mouth daily.   Yes Historical Provider, MD  FOLIC ACID PO Take 1 tablet by mouth daily.   Yes Historical Provider, MD  gabapentin (NEURONTIN) 300 MG capsule Take 1 capsule (300 mg total) by mouth 3 (three) times daily. Patient taking differently: Take 300 mg by mouth 3 (three) times daily. Pt stated, "Only takes every once in a while" 02/26/16  Yes Tamala Fothergill Regal, DPM  GARLIC PO Take 1 tablet by mouth daily.   Yes Historical Provider, MD  losartan-hydrochlorothiazide (HYZAAR) 100-25 MG tablet Take 1 tablet by mouth daily. 05/10/16  Yes [name], MD  metoprolol succinate (TOPROL-XL) 50 MG 24 hr tablet Take 1 tablet (50 mg total) by mouth daily. Take with or immediately following a meal. 12/08/16  Yes Leonie Douglas, PA-C  MILK THISTLE PO Take 1 tablet by mouth daily.   Yes Historical Provider, MD  Niacin (VITAMIN B-3 PO) Take 1 tablet by mouth daily. Reported on 05/10/2016   Yes Historical Provider, MD  NONFORMULARY OR COMPOUNDED [place] compounding:  Onychomycosis Nail Lacquer - Fluconazole 2%, Terbinafine 1%, DMSO. Apply daily to affected area. +11 refills. 03/07/16  Yes Wallene Huh, DPM  Omega-3 Fatty Acids (FISH OIL PO) Take 1 tablet by mouth daily.   Yes Historical Provider, MD  omeprazole (PRILOSEC) 20 MG capsule Take 20 mg by mouth  daily.   Yes Historical Provider, MD  Plant Sterols and Stanols (CHOLEST OFF PO) Take 1 tablet by mouth daily.   Yes Historical Provider, MD  tadalafil (CIALIS) 20 MG tablet Take 20 mg by mouth daily as needed for erectile dysfunction.   Yes Historical Provider, MD  terbinafine (LAMISIL) 250 MG tablet Take 1 tablet (250 mg total) by mouth daily. 02/26/16  Yes Wallene Huh, DPM  thiamine (VITAMIN B-1) 100 MG tablet Take 100 mg by mouth daily.   Yes Historical Provider, MD  AMBULATORY NON FORMULARY MEDICATION Liquid silver nitrite  1 dose by mouth daily    Historical Provider, MD   Social History   Social History  . Marital  status: Married    Spouse name: N/A  . Number of children: 1  . Years of education: 12+   Occupational History  . Not on file.   Social History Main Topics  . Smoking status: Never Smoker  . Smokeless tobacco: Never Used  . Alcohol use 1.2 oz/week    2 Cans of beer per week     Comment: weekly   . Drug use: No  . Sexual activity: Not on file   Other Topics Concern  . Not on file   Social History Narrative   Patient lives at home girlfriend [name].   Patient has no children but one the way.    Patient is currently working, he buys cars in sell them.   Patient has some college.   Review of Systems  Constitutional: Positive for fatigue. Negative for unexpected weight change.  Eyes: Negative for visual disturbance.  Respiratory: Positive for chest tightness and shortness of breath. Negative for cough.   Cardiovascular: Negative for chest pain, palpitations and leg swelling.  Gastrointestinal: Positive for anal bleeding. Negative for abdominal pain and blood in stool.  Musculoskeletal: Positive for arthralgias and joint swelling.  Neurological: Negative for dizziness, weakness, light-headedness and headaches.  Psychiatric/Behavioral: Positive for sleep disturbance.   Objective:  Physical Exam  Constitutional: He is oriented to person, place, and  time. He appears well-developed and well-nourished.  HENT:  Head: Normocephalic and atraumatic.  Eyes: EOM are normal. Pupils are equal, round, and reactive to light.  Neck: No JVD present. Carotid bruit is not present.  Cardiovascular: Normal rate, regular rhythm and normal heart sounds.  Exam reveals no gallop and no friction rub.   No murmur heard. Pulmonary/Chest: Effort normal and breath sounds normal. No respiratory distress. He has no wheezes. He has no rales.  Musculoskeletal: He exhibits edema (Trace non pitting edema at the ankles).  Neurological: He is alert and oriented to person, place, and time.  Skin: Skin is warm and dry.  Psychiatric: He has a normal mood and affect.  Vitals reviewed.  BP 134/82   Pulse 88   Temp 98 F (36.7 C) (Oral)   Resp 18   Ht 6\' 1"  (1.854 m)   Wt 252 lb (114.3 kg)   SpO2 96%   BMI 33.25 kg/m  Assessment & Plan:    Atlantis Rauseo is a 61 y.o. male Essential hypertension - Plan: TSH, losartan-hydrochlorothiazide (HYZAAR) 100-25 MG tablet, metoprolol succinate (TOPROL-XL) 50 MG 24 hr tablet  - stable, cont same meds, but consider decrease betablocker if running low or hypotensive sx's.  Labs pending.   Chest tightness - Plan: EKG 12-Lead, TSH, Ambulatory referral to Cardiology DOE (dyspnea on exertion) - Plan: EKG 12-Lead, Pro b natriuretic peptide, Ambulatory referral to Cardiology  - prior eval reassuring, but recommended eval with cardiology as increased DOE. Will check BNP, but no signs of failure on exam.   -  refer to cardiology to decide on other cardiac eval,  rtc to discuss further if not found to be cardiac related.   -ER/911 CP precautions reviewed and avoid exertional activities for now.   -ASA 81mg  QD for now - stop if worsening hemorrhoidal bleeding.   OSA on CPAP  - incomplete control by sx's. Call sleep specialist for eval and adjustment of equipment.   Peripheral edema - Plan: Comprehensive metabolic panel, TSH, CBC  -  minimal.  Increased by report at other times.   - check lytes, tsh, cbc, and avoid salt  and beer as that may be contributing. rtc if persistent/worsening.   Hyperlipidemia, unspecified hyperlipidemia type - Plan: Comprehensive metabolic panel, Lipid panel  -check labs, options discussed. Would like to try otc supplement Cholest off first. 10 year ASCVD risk discussed.   Hemorrhoids, unspecified hemorrhoid type - Plan: CBC   - rtc precautions or can meet with general surgeon if needed. Continue symptomatic care for now.   Meds ordered this encounter  Medications  . losartan-hydrochlorothiazide (HYZAAR) 100-25 MG tablet    Sig: Take 1 tablet by mouth daily.    Dispense:  90 tablet    Refill:  1  . metoprolol succinate (TOPROL-XL) 50 MG 24 hr tablet    Sig: Take 1 tablet (50 mg total) by mouth daily. Take with or immediately following a meal.    Dispense:  90 tablet    Refill:  1   Patient Instructions   If blood pressure runs low at home , or any lightheadedness/dizziness - cut back to 1/2 pill of metoprolol. Continue same dose of losartan/hctz. I will check some electrolytes today. Alcohol and salt in diet can cause swelling. If the swelling returns or daily swelling - will need to lok at other causes.   For your chest tightness, shortness of breath with exertion, I am concerned this may be related to your heart. I will refer you back to Dr. Acie Fredrickson for evaluation of those symptoms, but avoid exertional exercise for now or other activities that cause any chest pain/tightness. Return to the clinic or go to the nearest emergency room if any of your symptoms worsen or new symptoms occur. aspirin 81mg  each day.   Continue Cpap, but follow up with sleep specialist as your fatigue may be related to the machine not being at the right settings.   For cholesterol - I will check those levels again tomorrow. If you decide to restart your supplements, would recheck those levels in the next 2-3 months  to determine if you still have an elevated cardiovascular disease risk. As we discussed that risk was about 12% when checked last year, so would recommend trial of some statin in a few months if risk still elevated. We could always try a different statin besides Crestor or Lipitor to see if you tolerated this better.  If any worsening of hemorrhoids, or persistent bleeding, return to discuss further or emergency room if needed. . If you do have persistent bleeding with hemorrhoids, you may need to stop the aspirin temporarily.   Peripheral Edema Peripheral edema is swelling that is caused by a buildup of fluid. Peripheral edema most often affects the lower legs, ankles, and feet. It can also develop in the arms, hands, and face. The area of the body that has peripheral edema will look swollen. It may also feel heavy or warm. Your clothes may start to feel tight. Pressing on the area may make a temporary dent in your skin. You may not be able to move your arm or leg as much as usual. There are many causes of peripheral edema. It can be a complication of other diseases, such as congestive heart failure, kidney disease, or a problem with your blood circulation. It also can be a side effect of certain medicines. It often happens to women during pregnancy. Sometimes, the cause is not known. Treating the underlying condition is often the only treatment for peripheral edema. Follow these instructions at home: Pay attention to any changes in your symptoms. Take these actions to help with  your discomfort:  Raise (elevate) your legs while you are sitting or lying down.  Move around often to prevent stiffness and to lessen swelling. Do not sit or stand for long periods of time.  Wear support stockings as told by your health care provider.  Follow instructions from your health care provider about limiting salt (sodium) in your diet. Sometimes eating less salt can reduce swelling.  Take over-the-counter and  prescription medicines only as told by your health care provider. Your health care provider may prescribe medicine to help your body get rid of excess water (diuretic).  Keep all follow-up visits as told by your health care provider. This is important. Contact a health care provider if:  You have a fever.  Your edema starts suddenly or is getting worse, especially if you are pregnant or have a medical condition.  You have swelling in only one leg.  You have increased swelling and pain in your legs. Get help right away if:  You develop shortness of breath, especially when you are lying down.  You have pain in your chest or abdomen.  You feel weak.  You faint. This information is not intended to replace advice given to you by your health care provider. Make sure you discuss any questions you have with your health care provider. Document Released: 11/24/2004 Document Revised: 03/21/2016 Document Reviewed: 04/29/2015 Elsevier Interactive Patient Education  2017 [employer]  Nonspecific Chest Pain Chest pain can be caused by many different conditions. There is always a chance that your pain could be related to something serious, such as a heart attack or a blood clot in your lungs. Chest pain can also be caused by conditions that are not life-threatening. If you have chest pain, it is very important to follow up with your health care provider. What are the causes? Causes of this condition include:  Heartburn.  Pneumonia or bronchitis.  Anxiety or stress.  Inflammation around your heart (pericarditis) or lung (pleuritis or pleurisy).  A blood clot in your lung.  A collapsed lung (pneumothorax). This can develop suddenly on its own (spontaneous pneumothorax) or from trauma to the chest.  Shingles infection (varicella-zoster virus).  Heart attack.  Damage to the bones, muscles, and cartilage that make up your chest wall. This can include:  Bruised bones due to  injury.  Strained muscles or cartilage due to frequent or repeated coughing or overwork.  Fracture to one or more ribs.  Sore cartilage due to inflammation (costochondritis). What increases the risk? Risk factors for this condition may include:  Activities that increase your risk for trauma or injury to your chest.  Respiratory infections or conditions that cause frequent coughing.  Medical conditions or overeating that can cause heartburn.  Heart disease or family history of heart disease.  Conditions or health behaviors that increase your risk of developing a blood clot.  Having had chicken pox (varicella zoster). What are the signs or symptoms? Chest pain can feel like:  Burning or tingling on the surface of your chest or deep in your chest.  Crushing, pressure, aching, or squeezing pain.  Dull or sharp pain that is worse when you move, cough, or take a deep breath.  Pain that is also felt in your back, neck, shoulder, or arm, or pain that spreads to any of these areas. Your chest pain may come and go, or it may stay constant. How is this diagnosed? Lab tests or other studies may be needed to find the cause of your  pain. Your health care provider may have you take a test called an ECG (electrocardiogram). An ECG records your heartbeat patterns at the time the test is performed. You may also have other tests, such as:  Transthoracic echocardiogram (TTE). In this test, sound waves are used to create a picture of the heart structures and to look at how blood flows through your heart.  Transesophageal echocardiogram (TEE).This is a more advanced imaging test that takes images from inside your body. It allows your health care provider to see your heart in finer detail.  Cardiac monitoring. This allows your health care provider to monitor your heart rate and rhythm in real time.  Holter monitor. This is a portable device that records your heartbeat and can help to diagnose  abnormal heartbeats. It allows your health care provider to track your heart activity for several days, if needed.  Stress tests. These can be done through exercise or by taking medicine that makes your heart beat more quickly.  Blood tests.  Other imaging tests. How is this treated? Treatment depends on what is causing your chest pain. Treatment may include:  Medicines. These may include:  Acid blockers for heartburn.  Anti-inflammatory medicine.  Pain medicine for inflammatory conditions.  Antibiotic medicine, if an infection is present.  Medicines to dissolve blood clots.  Medicines to treat coronary artery disease (CAD).  Supportive care for conditions that do not require medicines. This may include:  Resting.  Applying heat or cold packs to injured areas.  Limiting activities until pain decreases. Follow these instructions at home: Medicines  If you were prescribed an antibiotic, take it as told by your health care provider. Do not stop taking the antibiotic even if you start to feel better.  Take over-the-counter and prescription medicines only as told by your health care provider. Lifestyle  Do not use any products that contain nicotine or tobacco, such as cigarettes and e-cigarettes. If you need help quitting, ask your health care provider.  Do not drink alcohol.  Make lifestyle changes as directed by your health care provider. These may include:  Getting regular exercise. Ask your health care provider to suggest some activities that are safe for you.  Eating a heart-healthy diet. A registered dietitian can help you to learn healthy eating options.  Maintaining a healthy weight.  Managing diabetes, if necessary.  Reducing stress, such as with yoga or relaxation techniques. General instructions  Avoid any activities that bring on chest pain.  If heartburn is the cause for your chest pain, raise (elevate) the head of your bed about 6 inches (15 cm) by  putting blocks under the legs. Sleeping with more pillows does not effectively relieve heartburn because it only changes the position of your head.  Keep all follow-up visits as told by your health care provider. This is important. This includes any further testing if your chest pain does not go away. Contact a health care provider if:  Your chest pain does not go away.  You have a rash with blisters on your chest.  You have a fever.  You have chills. Get help right away if:  Your chest pain is worse.  You have a cough that gets worse, or you cough up blood.  You have severe pain in your abdomen.  You have severe weakness.  You faint.  You have sudden, unexplained chest discomfort.  You have sudden, unexplained discomfort in your arms, back, neck, or jaw.  You have shortness of breath at any  time.  You suddenly start to sweat, or your skin gets clammy.  You feel nauseous or you vomit.  You suddenly feel light-headed or dizzy.  Your heart begins to beat quickly, or it feels like it is skipping beats. These symptoms may represent a serious problem that is an emergency. Do not wait to see if the symptoms will go away. Get medical help right away. Call your local emergency services (911 in the [place] Do not drive yourself to the hospital.  This information is not intended to replace advice given to you by your health care provider. Make sure you discuss any questions you have with your health care provider. Document Released: 07/27/2005 Document Revised: 07/11/2016 Document Reviewed: 07/11/2016 Elsevier Interactive Patient Education  2017 [employer].  About Hemorrhoids  Hemorrhoids are swollen veins in the lower rectum and anus.  Also called piles, hemorrhoids are a common problem.  Hemorrhoids may be internal (inside the rectum) or external (around the anus).  Internal Hemorrhoids  Internal hemorrhoids are often painless, but they rarely cause bleeding.  The internal  veins may stretch and fall down (prolapse) through the anus to the outside of the body.  The veins may then become irritated and painful.  External Hemorrhoids  External hemorrhoids can be easily seen or felt around the anal opening.  They are under the skin around the anus.  When the swollen veins are scratched or broken by straining, rubbing or wiping they sometimes bleed.  How Hemorrhoids Occur  Veins in the rectum and around the anus tend to swell under pressure.  Hemorrhoids can result from increased pressure in the veins of your anus or rectum.  Some sources of pressure are:   Straining to have a bowel movement because of constipation  Waiting too long to have a bowel movement  Coughing and sneezing often  Sitting for extended periods of time, including on the toilet  Diarrhea  Obesity  Trauma or injury to the anus  Some liver diseases  Stress  Family history of hemorrhoids  Pregnancy  Pregnant women should try to avoid becoming constipated, because they are more likely to have hemorrhoids during pregnancy.  In the last trimester of pregnancy, the enlarged uterus may press on blood vessels and causes hemorrhoids.  In addition, the strain of childbirth sometimes causes hemorrhoids after the birth.  Symptoms of Hemorrhoids  Some symptoms of hemorrhoids include:  Swelling and/or a tender lump around the anus  Itching, mild burning and bleeding around the anus  Painful bowel movements with or without constipation  Bright red blood covering the stool, on toilet paper or in the toilet bowel.   Symptoms usually go away within a few days.  Always talk to your doctor about any bleeding to make sure it is not from some other causes.  Diagnosing and Treating Hemorrhoids  Diagnosis is made by an examination by your healthcare provider.  Special test can be performed by your doctor.    Most cases of hemorrhoids can be treated with:  High-fiber diet: Eat more high-fiber  foods, which help prevent constipation.  Ask for more detailed fiber information on types and sources of fiber from your healthcare provider.  Fluids: Drink plenty of water.  This helps soften bowel movements so they are easier to pass.  Sitz baths and cold packs: Sitting in lukewarm water two or three times a day for 15 minutes cleases the anal area and may relieve discomfort.  If the water is too hot, swelling around  the anus will get worse.  Placing a cloth-covered ice pack on the anus for ten minutes four times a day can also help reduce selling.  Gently pushing a prolapsed hemorrhoid back inside after the bath or ice pack can be helpful.  Medications: For mild discomfort, your healthcare provider may suggest over-the-counter pain medication or prescribe a cream or ointment for topical use.  The cream may contain witch hazel, zinc oxide or petroleum jelly.  Medicated suppositories are also a treatment option.  Always consult your doctor before applying medications or creams.  Procedures and surgeries: There are also a number of procedures and surgeries to shrink or remove hemorrhoids in more serious cases.  Talk to your physician about these options.  You can often prevent hemorrhoids or keep them from becoming worse by maintaining a healthy lifestyle.  Eat a fiber-rich diet of fruits, vegetables and whole grains.  Also, drink plenty of water and exercise regularly.   2007, [employer] Doc.30  IF you received an x-ray today, you will receive an invoice from [facility]. Please contact [facility] at [phone] with questions or concerns regarding your invoice.   IF you received labwork today, you will receive an invoice from [place]. Please contact [facility] at [phone] with questions or concerns regarding your invoice.   Our billing staff will not be able to assist you with questions regarding bills from these companies.  You will be contacted with the  lab results as soon as they are available. The fastest way to get your results is to activate your My Chart account. Instructions are located on the last page of this paperwork. If you have not heard from [place] regarding the results in 2 weeks, please contact this office.      I personally performed the services described in this documentation, which was scribed in my presence. The recorded information has been reviewed and considered for accuracy and completeness, addended by me as needed, and agree with information above.  Signed,   Merri Ray, MD [facility].  12/10/16 11:44 AM

## 2016-12-08 NOTE — Telephone Encounter (Signed)
I called and spoke with patient. He is supposed to be out of town for the next few days and is running out of medication so he asked for another courtesy refill. We discussed that it appears he has not been followed up for HTN since 04/2016 so he needs to schedule an appointment with Dr. Carlota Raspberry. He states he will schedule an appointment next week. I have given him an additional 30 day refill of metoprolol.  Meds ordered this encounter  Medications  . metoprolol succinate (TOPROL-XL) 50 MG 24 hr tablet    Sig: Take 1 tablet (50 mg total) by mouth daily. Take with or immediately following a meal.    Dispense:  30 tablet    Refill:  0    Office visit needed for 90 day supply    Order Specific Question:   Supervising Provider    Answer:   Wardell Honour [[id number]]

## 2016-12-08 NOTE — Patient Instructions (Addendum)
If blood pressure runs low at home , or any lightheadedness/dizziness - cut back to 1/2 pill of metoprolol. Continue same dose of losartan/hctz. I will check some electrolytes today. Alcohol and salt in diet can cause swelling. If the swelling returns or daily swelling - will need to lok at other causes.   For your chest tightness, shortness of breath with exertion, I am concerned this may be related to your heart. I will refer you back to Dr. Acie Fredrickson for evaluation of those symptoms, but avoid exertional exercise for now or other activities that cause any chest pain/tightness. Return to the clinic or go to the nearest emergency room if any of your symptoms worsen or new symptoms occur. aspirin 81mg  each day.   Continue Cpap, but follow up with sleep specialist as your fatigue may be related to the machine not being at the right settings.   For cholesterol - I will check those levels again tomorrow. If you decide to restart your supplements, would recheck those levels in the next 2-3 months to determine if you still have an elevated cardiovascular disease risk. As we discussed that risk was about 12% when checked last year, so would recommend trial of some statin in a few months if risk still elevated. We could always try a different statin besides Crestor or Lipitor to see if you tolerated this better.  If any worsening of hemorrhoids, or persistent bleeding, return to discuss further or emergency room if needed. . If you do have persistent bleeding with hemorrhoids, you may need to stop the aspirin temporarily.   Peripheral Edema Peripheral edema is swelling that is caused by a buildup of fluid. Peripheral edema most often affects the lower legs, ankles, and feet. It can also develop in the arms, hands, and face. The area of the body that has peripheral edema will look swollen. It may also feel heavy or warm. Your clothes may start to feel tight. Pressing on the area may make a temporary dent in your  skin. You may not be able to move your arm or leg as much as usual. There are many causes of peripheral edema. It can be a complication of other diseases, such as congestive heart failure, kidney disease, or a problem with your blood circulation. It also can be a side effect of certain medicines. It often happens to women during pregnancy. Sometimes, the cause is not known. Treating the underlying condition is often the only treatment for peripheral edema. Follow these instructions at home: Pay attention to any changes in your symptoms. Take these actions to help with your discomfort:  Raise (elevate) your legs while you are sitting or lying down.  Move around often to prevent stiffness and to lessen swelling. Do not sit or stand for long periods of time.  Wear support stockings as told by your health care provider.  Follow instructions from your health care provider about limiting salt (sodium) in your diet. Sometimes eating less salt can reduce swelling.  Take over-the-counter and prescription medicines only as told by your health care provider. Your health care provider may prescribe medicine to help your body get rid of excess water (diuretic).  Keep all follow-up visits as told by your health care provider. This is important. Contact a health care provider if:  You have a fever.  Your edema starts suddenly or is getting worse, especially if you are pregnant or have a medical condition.  You have swelling in only one leg.  You have increased  swelling and pain in your legs. Get help right away if:  You develop shortness of breath, especially when you are lying down.  You have pain in your chest or abdomen.  You feel weak.  You faint. This information is not intended to replace advice given to you by your health care provider. Make sure you discuss any questions you have with your health care provider. Document Released: 11/24/2004 Document Revised: 03/21/2016 Document Reviewed:  04/29/2015 Elsevier Interactive Patient Education  2017 [employer]  Nonspecific Chest Pain Chest pain can be caused by many different conditions. There is always a chance that your pain could be related to something serious, such as a heart attack or a blood clot in your lungs. Chest pain can also be caused by conditions that are not life-threatening. If you have chest pain, it is very important to follow up with your health care provider. What are the causes? Causes of this condition include:  Heartburn.  Pneumonia or bronchitis.  Anxiety or stress.  Inflammation around your heart (pericarditis) or lung (pleuritis or pleurisy).  A blood clot in your lung.  A collapsed lung (pneumothorax). This can develop suddenly on its own (spontaneous pneumothorax) or from trauma to the chest.  Shingles infection (varicella-zoster virus).  Heart attack.  Damage to the bones, muscles, and cartilage that make up your chest wall. This can include:  Bruised bones due to injury.  Strained muscles or cartilage due to frequent or repeated coughing or overwork.  Fracture to one or more ribs.  Sore cartilage due to inflammation (costochondritis). What increases the risk? Risk factors for this condition may include:  Activities that increase your risk for trauma or injury to your chest.  Respiratory infections or conditions that cause frequent coughing.  Medical conditions or overeating that can cause heartburn.  Heart disease or family history of heart disease.  Conditions or health behaviors that increase your risk of developing a blood clot.  Having had chicken pox (varicella zoster). What are the signs or symptoms? Chest pain can feel like:  Burning or tingling on the surface of your chest or deep in your chest.  Crushing, pressure, aching, or squeezing pain.  Dull or sharp pain that is worse when you move, cough, or take a deep breath.  Pain that is also felt in your back,  neck, shoulder, or arm, or pain that spreads to any of these areas. Your chest pain may come and go, or it may stay constant. How is this diagnosed? Lab tests or other studies may be needed to find the cause of your pain. Your health care provider may have you take a test called an ECG (electrocardiogram). An ECG records your heartbeat patterns at the time the test is performed. You may also have other tests, such as:  Transthoracic echocardiogram (TTE). In this test, sound waves are used to create a picture of the heart structures and to look at how blood flows through your heart.  Transesophageal echocardiogram (TEE).This is a more advanced imaging test that takes images from inside your body. It allows your health care provider to see your heart in finer detail.  Cardiac monitoring. This allows your health care provider to monitor your heart rate and rhythm in real time.  Holter monitor. This is a portable device that records your heartbeat and can help to diagnose abnormal heartbeats. It allows your health care provider to track your heart activity for several days, if needed.  Stress tests. These can be done  through exercise or by taking medicine that makes your heart beat more quickly.  Blood tests.  Other imaging tests. How is this treated? Treatment depends on what is causing your chest pain. Treatment may include:  Medicines. These may include:  Acid blockers for heartburn.  Anti-inflammatory medicine.  Pain medicine for inflammatory conditions.  Antibiotic medicine, if an infection is present.  Medicines to dissolve blood clots.  Medicines to treat coronary artery disease (CAD).  Supportive care for conditions that do not require medicines. This may include:  Resting.  Applying heat or cold packs to injured areas.  Limiting activities until pain decreases. Follow these instructions at home: Medicines  If you were prescribed an antibiotic, take it as told by  your health care provider. Do not stop taking the antibiotic even if you start to feel better.  Take over-the-counter and prescription medicines only as told by your health care provider. Lifestyle  Do not use any products that contain nicotine or tobacco, such as cigarettes and e-cigarettes. If you need help quitting, ask your health care provider.  Do not drink alcohol.  Make lifestyle changes as directed by your health care provider. These may include:  Getting regular exercise. Ask your health care provider to suggest some activities that are safe for you.  Eating a heart-healthy diet. A registered dietitian can help you to learn healthy eating options.  Maintaining a healthy weight.  Managing diabetes, if necessary.  Reducing stress, such as with yoga or relaxation techniques. General instructions  Avoid any activities that bring on chest pain.  If heartburn is the cause for your chest pain, raise (elevate) the head of your bed about 6 inches (15 cm) by putting blocks under the legs. Sleeping with more pillows does not effectively relieve heartburn because it only changes the position of your head.  Keep all follow-up visits as told by your health care provider. This is important. This includes any further testing if your chest pain does not go away. Contact a health care provider if:  Your chest pain does not go away.  You have a rash with blisters on your chest.  You have a fever.  You have chills. Get help right away if:  Your chest pain is worse.  You have a cough that gets worse, or you cough up blood.  You have severe pain in your abdomen.  You have severe weakness.  You faint.  You have sudden, unexplained chest discomfort.  You have sudden, unexplained discomfort in your arms, back, neck, or jaw.  You have shortness of breath at any time.  You suddenly start to sweat, or your skin gets clammy.  You feel nauseous or you vomit.  You suddenly feel  light-headed or dizzy.  Your heart begins to beat quickly, or it feels like it is skipping beats. These symptoms may represent a serious problem that is an emergency. Do not wait to see if the symptoms will go away. Get medical help right away. Call your local emergency services (911 in the [place] Do not drive yourself to the hospital.  This information is not intended to replace advice given to you by your health care provider. Make sure you discuss any questions you have with your health care provider. Document Released: 07/27/2005 Document Revised: 07/11/2016 Document Reviewed: 07/11/2016 Elsevier Interactive Patient Education  2017 [employer].  About Hemorrhoids  Hemorrhoids are swollen veins in the lower rectum and anus.  Also called piles, hemorrhoids are a common problem.  Hemorrhoids  may be internal (inside the rectum) or external (around the anus).  Internal Hemorrhoids  Internal hemorrhoids are often painless, but they rarely cause bleeding.  The internal veins may stretch and fall down (prolapse) through the anus to the outside of the body.  The veins may then become irritated and painful.  External Hemorrhoids  External hemorrhoids can be easily seen or felt around the anal opening.  They are under the skin around the anus.  When the swollen veins are scratched or broken by straining, rubbing or wiping they sometimes bleed.  How Hemorrhoids Occur  Veins in the rectum and around the anus tend to swell under pressure.  Hemorrhoids can result from increased pressure in the veins of your anus or rectum.  Some sources of pressure are:   Straining to have a bowel movement because of constipation  Waiting too long to have a bowel movement  Coughing and sneezing often  Sitting for extended periods of time, including on the toilet  Diarrhea  Obesity  Trauma or injury to the anus  Some liver diseases  Stress  Family history of hemorrhoids  Pregnancy  Pregnant women  should try to avoid becoming constipated, because they are more likely to have hemorrhoids during pregnancy.  In the last trimester of pregnancy, the enlarged uterus may press on blood vessels and causes hemorrhoids.  In addition, the strain of childbirth sometimes causes hemorrhoids after the birth.  Symptoms of Hemorrhoids  Some symptoms of hemorrhoids include:  Swelling and/or a tender lump around the anus  Itching, mild burning and bleeding around the anus  Painful bowel movements with or without constipation  Bright red blood covering the stool, on toilet paper or in the toilet bowel.   Symptoms usually go away within a few days.  Always talk to your doctor about any bleeding to make sure it is not from some other causes.  Diagnosing and Treating Hemorrhoids  Diagnosis is made by an examination by your healthcare provider.  Special test can be performed by your doctor.    Most cases of hemorrhoids can be treated with:  High-fiber diet: Eat more high-fiber foods, which help prevent constipation.  Ask for more detailed fiber information on types and sources of fiber from your healthcare provider.  Fluids: Drink plenty of water.  This helps soften bowel movements so they are easier to pass.  Sitz baths and cold packs: Sitting in lukewarm water two or three times a day for 15 minutes cleases the anal area and may relieve discomfort.  If the water is too hot, swelling around the anus will get worse.  Placing a cloth-covered ice pack on the anus for ten minutes four times a day can also help reduce selling.  Gently pushing a prolapsed hemorrhoid back inside after the bath or ice pack can be helpful.  Medications: For mild discomfort, your healthcare provider may suggest over-the-counter pain medication or prescribe a cream or ointment for topical use.  The cream may contain witch hazel, zinc oxide or petroleum jelly.  Medicated suppositories are also a treatment option.  Always consult  your doctor before applying medications or creams.  Procedures and surgeries: There are also a number of procedures and surgeries to shrink or remove hemorrhoids in more serious cases.  Talk to your physician about these options.  You can often prevent hemorrhoids or keep them from becoming worse by maintaining a healthy lifestyle.  Eat a fiber-rich diet of fruits, vegetables and whole grains.  Also, drink  plenty of water and exercise regularly.   2007, [employer] Doc.30  IF you received an x-ray today, you will receive an invoice from [facility]. Please contact [facility] at [phone] with questions or concerns regarding your invoice.   IF you received labwork today, you will receive an invoice from [place]. Please contact [facility] at [phone] with questions or concerns regarding your invoice.   Our billing staff will not be able to assist you with questions regarding bills from these companies.  You will be contacted with the lab results as soon as they are available. The fastest way to get your results is to activate your My Chart account. Instructions are located on the last page of this paperwork. If you have not heard from [place] regarding the results in 2 weeks, please contact this office.

## 2016-12-09 ENCOUNTER — Other Ambulatory Visit: Payer: BLUE CROSS/BLUE SHIELD

## 2016-12-09 DIAGNOSIS — R0789 Other chest pain: Secondary | ICD-10-CM

## 2016-12-09 DIAGNOSIS — R609 Edema, unspecified: Secondary | ICD-10-CM

## 2016-12-09 DIAGNOSIS — I10 Essential (primary) hypertension: Secondary | ICD-10-CM

## 2016-12-09 DIAGNOSIS — E785 Hyperlipidemia, unspecified: Secondary | ICD-10-CM

## 2016-12-09 DIAGNOSIS — K649 Unspecified hemorrhoids: Secondary | ICD-10-CM

## 2016-12-09 DIAGNOSIS — G4733 Obstructive sleep apnea (adult) (pediatric): Secondary | ICD-10-CM

## 2016-12-09 DIAGNOSIS — G4719 Other hypersomnia: Secondary | ICD-10-CM

## 2016-12-09 DIAGNOSIS — R0609 Other forms of dyspnea: Secondary | ICD-10-CM

## 2016-12-10 LAB — CBC
Hematocrit: 41 % (ref 37.5–51.0)
Hemoglobin: 14 g/dL (ref 13.0–17.7)
MCH: 30.8 pg (ref 26.6–33.0)
MCHC: 34.1 g/dL (ref 31.5–35.7)
MCV: 90 fL (ref 79–97)
PLATELETS: 205 10*3/uL (ref 150–379)
RBC: 4.55 x10E6/uL (ref 4.14–5.80)
RDW: 13.7 % (ref 12.3–15.4)
WBC: 6.8 10*3/uL (ref 3.4–10.8)

## 2016-12-10 LAB — COMPREHENSIVE METABOLIC PANEL
A/G RATIO: 1.6 (ref 1.2–2.2)
ALBUMIN: 4.1 g/dL (ref 3.6–4.8)
ALK PHOS: 39 IU/L (ref 39–117)
ALT: 37 IU/L (ref 0–44)
AST: 28 IU/L (ref 0–40)
BUN/Creatinine Ratio: 17 (ref 10–24)
BUN: 16 mg/dL (ref 8–27)
Bilirubin Total: 0.6 mg/dL (ref 0.0–1.2)
CALCIUM: 9.2 mg/dL (ref 8.6–10.2)
CO2: 26 mmol/L (ref 18–29)
Chloride: 99 mmol/L (ref 96–106)
Creatinine, Ser: 0.96 mg/dL (ref 0.76–1.27)
GFR calc Af Amer: 99 mL/min/{1.73_m2} (ref >59)
GFR calc non Af Amer: 86 mL/min/{1.73_m2} (ref >59)
GLOBULIN, TOTAL: 2.5 g/dL (ref 1.5–4.5)
Glucose: 105 mg/dL — ABNORMAL HIGH (ref 65–99)
Potassium: 4.7 mmol/L (ref 3.5–5.2)
SODIUM: 139 mmol/L (ref 134–144)
Total Protein: 6.6 g/dL (ref 6.0–8.5)

## 2016-12-10 LAB — LIPID PANEL
Chol/HDL Ratio: 6 ratio units — ABNORMAL HIGH (ref 0.0–5.0)
Cholesterol, Total: 209 mg/dL — ABNORMAL HIGH (ref 100–199)
HDL: 35 mg/dL — ABNORMAL LOW (ref >39)
LDL CALC: 148 mg/dL — AB (ref 0–99)
Triglycerides: 130 mg/dL (ref 0–149)
VLDL CHOLESTEROL CAL: 26 mg/dL (ref 5–40)

## 2016-12-10 LAB — PRO B NATRIURETIC PEPTIDE: NT-Pro BNP: 12 pg/mL (ref 0–210)

## 2016-12-10 LAB — TSH: TSH: 3.27 u[IU]/mL (ref 0.450–4.500)

## 2016-12-16 ENCOUNTER — Ambulatory Visit: Payer: BLUE CROSS/BLUE SHIELD | Admitting: Physician Assistant

## 2016-12-19 ENCOUNTER — Ambulatory Visit (INDEPENDENT_AMBULATORY_CARE_PROVIDER_SITE_OTHER): Payer: BLUE CROSS/BLUE SHIELD | Admitting: Physician Assistant

## 2016-12-19 ENCOUNTER — Encounter (INDEPENDENT_AMBULATORY_CARE_PROVIDER_SITE_OTHER): Payer: Self-pay

## 2016-12-19 ENCOUNTER — Encounter: Payer: Self-pay | Admitting: Physician Assistant

## 2016-12-19 VITALS — BP 132/80 | HR 68 | Ht 73.0 in | Wt 248.1 lb

## 2016-12-19 DIAGNOSIS — R0789 Other chest pain: Secondary | ICD-10-CM

## 2016-12-19 DIAGNOSIS — E785 Hyperlipidemia, unspecified: Secondary | ICD-10-CM | POA: Diagnosis not present

## 2016-12-19 DIAGNOSIS — G4733 Obstructive sleep apnea (adult) (pediatric): Secondary | ICD-10-CM

## 2016-12-19 DIAGNOSIS — I10 Essential (primary) hypertension: Secondary | ICD-10-CM | POA: Diagnosis not present

## 2016-12-19 NOTE — Progress Notes (Signed)
Cardiology Office Note:    Date:  12/19/2016   ID:  Shane Sawyer, DOB [birth year], MRN [id number]  PCP:  [name], MD  Cardiologist:  Dr. Liam Rogers   Electrophysiologist:  n/a  Referring MD: [name], MD   Chief Complaint  Patient presents with  . Chest Pain    History of Present Illness:    Shane Sawyer is a 61 y.o. male with a hx of HTN, sleep apnea on CPAP.  He has been intolerant of statins in the past. He was first evaluated by Truitt Merle, NP and Dr. Liam Rogers in 2016 for elevated BP, dizziness, dyspnea on exertion, chest pain.  Echo at that time demonstrated normal ejection fraction, no valvular abnormalities and moderate diastolic dysfunction.  Nuclear stress test demonstrated no ischemia.    He was recently seen by primary care with complaints of chest discomfort and shortness of breath. ECG was unremarkable without significant changes. Extensive lab work including TSH, CBC, CMET and BNP were all normal. LDL was elevated at 148. He is referred back to cardiology by Dr. Carlota Raspberry for further evaluation.  He is here alone.  He notes dyspnea on exertion and exertional chest pain over the past year or so.  He denies any worsening symptoms.  He attributes his symptoms to being out of shape.  He was recently dx with OSA and feels much better on CPAP.  He denies orthopnea, edema, syncope.  He denies significant cough.    Prior CV studies:   The following studies were reviewed today:  Echo 11/16 EF 55-60, normal wall motion, grade 2 diastolic dysfunction, trivial AI, MAC, PASP 24  Myoview 10/16 EF 59, no ischemia, low risk study  Past Medical History:  Diagnosis Date  . Erectile dysfunction   . HBP (high blood pressure)     Past Surgical History:  Procedure Laterality Date  . NOSE SURGERY     times 2    Current Medications: Current Meds  Medication Sig  . AMBULATORY NON FORMULARY MEDICATION Liquid silver nitrite  1 dose by mouth daily  . AMBULATORY  NON FORMULARY MEDICATION Emergency B complex Takes one tablet daily  . AMBULATORY NON FORMULARY MEDICATION L-arginine Take one tablet daily  . APPLE CIDER VINEGAR PO Take 1 Dose by mouth daily.  . COD LIVER OIL PO Take 1 Dose by mouth daily.  . Coenzyme Q10 (CO Q-10 PO) Take 1 tablet by mouth daily.  Marland Kitchen FOLIC ACID PO Take 1 tablet by mouth daily.  Marland Kitchen gabapentin (NEURONTIN) 300 MG capsule Take 1 capsule (300 mg total) by mouth 3 (three) times daily.  Marland Kitchen GARLIC PO Take 1 tablet by mouth daily.  Marland Kitchen losartan-hydrochlorothiazide (HYZAAR) 100-25 MG tablet Take 1 tablet by mouth daily.  . metoprolol succinate (TOPROL-XL) 50 MG 24 hr tablet Take 1 tablet (50 mg total) by mouth daily. Take with or immediately following a meal.  . MILK THISTLE PO Take 1 tablet by mouth daily.  . Niacin (VITAMIN B-3 PO) Take 1 tablet by mouth daily. Reported on 05/10/2016  . NONFORMULARY OR COMPOUNDED [place] compounding:  Onychomycosis Nail Lacquer - Fluconazole 2%, Terbinafine 1%, DMSO. Apply daily to affected area. +11 refills.  . Omega-3 Fatty Acids (FISH OIL PO) Take 1 tablet by mouth daily.  Marland Kitchen omeprazole (PRILOSEC) 20 MG capsule Take 20 mg by mouth daily.  . Plant Sterols and Stanols (CHOLEST OFF PO) Take 1 tablet by mouth daily.  . tadalafil (CIALIS) 20 MG tablet Take  20 mg by mouth daily as needed for erectile dysfunction.  . terbinafine (LAMISIL) 250 MG tablet Take 1 tablet (250 mg total) by mouth daily.  Marland Kitchen thiamine (VITAMIN B-1) 100 MG tablet Take 100 mg by mouth daily.     Allergies:   Ivp dye [iodinated diagnostic agents]; Penicillins; and Sulfur   Social History   Social History  . Marital status: Married    Spouse name: N/A  . Number of children: 1  . Years of education: 12+   Social History Main Topics  . Smoking status: Never Smoker  . Smokeless tobacco: Never Used  . Alcohol use 1.2 oz/week    2 Cans of beer per week     Comment: weekly   . Drug use: No  . Sexual activity: Not  Asked   Other Topics Concern  . None   Social History Narrative   Patient lives at home girlfriend [name].   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     Family History  Problem Relation Age of Onset  . Bladder Cancer Father   . Heart failure Mother   . Valvular heart disease Mother   . Colon cancer Neg Hx   . Heart attack Neg Hx      ROS:   Please see the history of present illness.    Review of Systems  Cardiovascular: Positive for chest pain, dyspnea on exertion and leg swelling.  Respiratory: Positive for snoring.    All other systems reviewed and are negative.   EKGs/Labs/Other Test Reviewed:    EKG:  EKG is  ordered today.  The ekg ordered today demonstrates NSR, HR 68, NSSTW changes, QTc 408 ms, no sig changes  Recent Labs: 05/10/2016: Hemoglobin 13.8 12/09/2016: ALT 37; BUN 16; Creatinine, Ser 0.96; NT-Pro BNP 12; Platelets 205; Potassium 4.7; Sodium 139; TSH 3.270   Recent Lipid Panel    Component Value Date/Time   CHOL 209 (H) 12/09/2016 0813   TRIG 130 12/09/2016 0813   HDL 35 (L) 12/09/2016 0813   CHOLHDL 6.0 (H) 12/09/2016 0813   CHOLHDL 5.4 (H) 05/10/2016 1624   VLDL 29 05/10/2016 1624   LDLCALC 148 (H) 12/09/2016 0813     Physical Exam:    VS:  BP 132/80   Pulse 68   Ht 6' 1"  (1.854 m)   Wt 248 lb 1.9 oz (112.5 kg)   BMI 32.74 kg/m     Wt Readings from Last 3 Encounters:  12/19/16 248 lb 1.9 oz (112.5 kg)  12/08/16 252 lb (114.3 kg)  07/12/16 242 lb (109.8 kg)     Physical Exam  Constitutional: He is oriented to person, place, and time. He appears well-developed and well-nourished. No distress.  HENT:  Head: Normocephalic and atraumatic.  Eyes: No scleral icterus.  Neck: Normal range of motion. No JVD present. Carotid bruit is not present.  Cardiovascular: Normal rate, regular rhythm, S1 normal and S2 normal.    No murmur heard. Pulmonary/Chest: Effort normal and breath sounds normal. He has no wheezes. He has no rhonchi. He has no rales.  Abdominal: Soft. There is no tenderness.  Musculoskeletal: He exhibits no edema.  Neurological: He is alert and oriented to person, place, and time.  Skin: Skin is warm and dry.  Psychiatric: He has a normal mood and affect.    ASSESSMENT:  1. Other chest pain   2. Essential hypertension   3. Hyperlipidemia, unspecified hyperlipidemia type   4. OSA (obstructive sleep apnea)    PLAN:    In order of problems listed above:  1. Chest pain - He notes exertional symptoms as well as dyspnea on exertion.  He is s/w vague about his symptoms but overall notes stability over the last year.  His ECG is without significant changes.  He does have RFs of HTN, HL.  Since he notes exertional symptoms, I will set him up for an exercise nuclear stress test.  If this is low risk, he should follow up with his PCP for further evaluation.  2. HTN - BP is controlled.  3. HL - Managed by PCP.  He prefers to manage with dietary changes.    4. OSA - He remains on CPAP.    Medication Adjustments/Labs and Tests Ordered: Current medicines are reviewed at length with the patient today.  Concerns regarding medicines are outlined above.  Medication changes, Labs and Tests ordered today are outlined in the Patient Instructions noted below. Patient Instructions  Medication Instructions:  Your physician recommends that you continue on your current medications as directed. Please refer to the Current Medication list given to you today.  Labwork: NONE  Testing/Procedures: 1. Your physician has requested that you have en exercise stress myoview. For further information please visit [email or web address]. Please follow instruction sheet, as given.  Follow-Up: AS NEEDED FOR FOLLOW UP AT THIS TIME.   Any Other Special Instructions Will Be Listed Below (If Applicable).  If you need  a refill on your cardiac medications before your next appointment, please call your pharmacy.   Return if symptoms worsen or fail to improve, for w/ Dr. Acie Fredrickson.   Signed, Richardson Dopp, PA-C  12/19/2016 3:09 PM    [facility] [address], [place], [place]  [address] Phone: [phone]; Fax: [phone]

## 2016-12-19 NOTE — Patient Instructions (Addendum)
Medication Instructions:  Your physician recommends that you continue on your current medications as directed. Please refer to the Current Medication list given to you today.  Labwork: NONE  Testing/Procedures: 1. Your physician has requested that you have en exercise stress myoview. For further information please visit [email or web address]. Please follow instruction sheet, as given.  Follow-Up: AS NEEDED FOR FOLLOW UP AT THIS TIME.   Any Other Special Instructions Will Be Listed Below (If Applicable).  If you need a refill on your cardiac medications before your next appointment, please call your pharmacy.

## 2016-12-27 ENCOUNTER — Telehealth (HOSPITAL_COMMUNITY): Payer: Self-pay | Admitting: *Deleted

## 2016-12-27 NOTE — Telephone Encounter (Signed)
Patient given detailed instructions per Myocardial Perfusion Study Information Sheet for the test on 12/30/16 at 0745. Patient notified to arrive 15 minutes early and that it is imperative to arrive on time for appointment to keep from having the test rescheduled.  If you need to cancel or reschedule your appointment, please call the office within 24 hours of your appointment. Failure to do so may result in a cancellation of your appointment, and a $50 no show fee. Patient verbalized understanding.Lyrical Sowle, Ranae Palms

## 2016-12-29 HISTORY — PX: SKIN CANCER EXCISION: SHX779

## 2016-12-30 ENCOUNTER — Encounter: Payer: Self-pay | Admitting: Physician Assistant

## 2016-12-30 ENCOUNTER — Ambulatory Visit (HOSPITAL_COMMUNITY): Payer: BLUE CROSS/BLUE SHIELD | Attending: Cardiovascular Disease

## 2016-12-30 ENCOUNTER — Telehealth: Payer: Self-pay | Admitting: *Deleted

## 2016-12-30 DIAGNOSIS — I10 Essential (primary) hypertension: Secondary | ICD-10-CM | POA: Diagnosis not present

## 2016-12-30 DIAGNOSIS — R0609 Other forms of dyspnea: Secondary | ICD-10-CM | POA: Diagnosis not present

## 2016-12-30 DIAGNOSIS — R079 Chest pain, unspecified: Secondary | ICD-10-CM

## 2016-12-30 DIAGNOSIS — R0600 Dyspnea, unspecified: Secondary | ICD-10-CM

## 2016-12-30 DIAGNOSIS — R0789 Other chest pain: Secondary | ICD-10-CM | POA: Diagnosis not present

## 2016-12-30 LAB — MYOCARDIAL PERFUSION IMAGING
CHL CUP NUCLEAR SDS: 1
CHL CUP RESTING HR STRESS: 64 {beats}/min
LHR: 0.29
LVDIAVOL: 119 mL (ref 62–150)
LVSYSVOL: 60 mL
NUC STRESS TID: 0.97
Peak HR: 110 {beats}/min
SRS: 1
SSS: 2

## 2016-12-30 MED ORDER — REGADENOSON 0.4 MG/5ML IV SOLN
0.4000 mg | Freq: Once | INTRAVENOUS | Status: AC
  Administered 2016-12-30: 0.4 mg via INTRAVENOUS

## 2016-12-30 MED ORDER — TECHNETIUM TC 99M TETROFOSMIN IV KIT
31.7000 | PACK | Freq: Once | INTRAVENOUS | Status: AC | PRN
  Administered 2016-12-30: 31.7 via INTRAVENOUS
  Filled 2016-12-30: qty 32

## 2016-12-30 MED ORDER — TECHNETIUM TC 99M TETROFOSMIN IV KIT
10.1000 | PACK | Freq: Once | INTRAVENOUS | Status: AC | PRN
Start: 2016-12-30 — End: 2016-12-30
  Administered 2016-12-30: 10.1 via INTRAVENOUS
  Filled 2016-12-30: qty 11

## 2016-12-30 NOTE — Telephone Encounter (Signed)
Lmtcb to go over Myoview results and recommendations. Per Brynda Rim. PA he would like to order an Echo to re-check EF.

## 2017-01-02 NOTE — Telephone Encounter (Signed)
Follow up    Pt is returning call about stress test results.

## 2017-01-02 NOTE — Telephone Encounter (Signed)
I spoke with the pt and made him aware of myoview results. Echocardiogram scheduled on 01/24/17 around the pt's work schedule. Order placed in the system.

## 2017-01-07 ENCOUNTER — Other Ambulatory Visit: Payer: Self-pay | Admitting: Physician Assistant

## 2017-01-07 DIAGNOSIS — I10 Essential (primary) hypertension: Secondary | ICD-10-CM

## 2017-01-12 ENCOUNTER — Encounter (INDEPENDENT_AMBULATORY_CARE_PROVIDER_SITE_OTHER): Payer: Self-pay

## 2017-01-12 ENCOUNTER — Encounter: Payer: Self-pay | Admitting: Neurology

## 2017-01-12 ENCOUNTER — Ambulatory Visit (INDEPENDENT_AMBULATORY_CARE_PROVIDER_SITE_OTHER): Payer: BLUE CROSS/BLUE SHIELD | Admitting: Neurology

## 2017-01-12 VITALS — BP 127/76 | HR 73 | Resp 20 | Wt 247.0 lb

## 2017-01-12 DIAGNOSIS — Z9989 Dependence on other enabling machines and devices: Secondary | ICD-10-CM | POA: Diagnosis not present

## 2017-01-12 DIAGNOSIS — G4733 Obstructive sleep apnea (adult) (pediatric): Secondary | ICD-10-CM | POA: Diagnosis not present

## 2017-01-12 NOTE — Patient Instructions (Signed)

## 2017-01-12 NOTE — Addendum Note (Signed)
Addended by: Larey Seat on: 01/12/2017 11:01 AM   Modules accepted: Orders

## 2017-01-12 NOTE — Progress Notes (Signed)
SLEEP MEDICINE CLINIC   Provider:  Larey Seat, M D  Referring Provider: Darlyne Russian, MD Primary Care Physician:  [name], MD  Chief Complaint  Patient presents with  . Snoring    rm 11, CPAP  . Follow-up    one year    HPI:  Zyire Eidson is a 61 y.o. male , seen here as a referral from Dr. Everlene Farrier for a sleep evaluation,   Chief complaint according to patient : " I am just more tired "   Mr. Grand is a 61 year old married Caucasian gentleman right-handed who presents today with a chief complaint of daytime tiredness rather than sleepiness. He states that he does not have any kind of chest pain diaphoresis, he has not felt palpitations and he has not been wheezing. He is a complete cardiac workup that was negative in December 2016 he also was evaluated by Dr. Everlene Farrier, this time for gastroesophageal reflux. Was doing this last visit with Mr. Madry mentioned that he felt more tired and this led to his referral here. He also stated that he was obtaining a life insurance policy and as long as the suspicion of sleep apnea was part of his medical records the policy price would be much increased.   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 He has weeks during which she will work 12 hours a day and others when he is off. This time to return home after work also varies greatly depending on his variable work schedule.   Sleep habits are as follows: Mr. Testa states that he has no trouble going to sleep and in some situations may sleep when not indicated. In usually his rise time on Monday  is about 8 AM. On days was a scheduled auction he will wake up as early as 5 AM. Wednesday at 6 AM. He will go to bed between midnight and one AM, and drives again 7 hours after auction.    Some of the auctions take place out of state. This causes fatigue.  Some nights have 7 and other nights 5 hours of sleep. He takes 20-30 minutes power naps in daytime.  Week ends he is up at 8. His  daughter is 83 years old. His wife is staying home.  His bedtime varies equally great.  He noted that his young child is not waking him up.  he has about 2 nocturia is nonetheless sometimes more, his wife has noted him to snore. She has not witnessed apneas. She heart him gasp only after alcohol was  consumed. He feels usually at 8 AM  he is usually feeling still as if he could sleep a little longer but he does not wake up with headaches usually he has no palpitations again no nausea. No headaches in the morning .He falls asleep in front of the TV,  Sleeps on 2 pillows, cool and quiet and dark. Fan in background.   History and family sleep history: Mother has OSA and is on CPAP. Social history: Married, father of a toddler. Publishing copy, Paramedic, transfers cars.   Interval history from 01/12/2017, I had the pleasure of seeing Mr. Medlin today, had last seen almost a year ago. Mr. Toto is a father of a young daughter and states that his days are depending on his work schedule, irregular he often has to arise very early and he spends what time he has with his daughter at home usually in the afternoons. He often drives  long distances as I have explained above. He also mentioned that even in childhood and during his school years he was a sleepy individual. After our sleep consultation a home sleep test was ordered, and it resulted in an AHI of 40.2 , RDI  of 41.7 but less frequent oxygen desaturation. The study also reported some borderline hypoxemia. Based on the home sleep test the patient would have had severe sleep apnea. The patient was placed on an auto titration CPAP, in keeping with insurance restrictions. He shows excellent compliance is 97% of use days but some nights he just doesn't get 4 hours of sleep. His average sleep time is 4 hours and 29 minutes with use of CPAP, the AutoSet has a pressure window between 5 and 15 cm water with 3 cm EPR. There is a very high residual AHI seen on this download  that I cannot easily explain. The 95th percentile pressure was 11.7 but there are significant air leaks. He confessed that he usually doesn't stop the CPAP or switch it off before he goes to the bathroom or removes the mask. This may lead to a lot of artificial erroneous apnea messages. He reaches dream sleep no more he reports, he did not endorse any depression, his Epworth sleepiness score was endorsed at only 6 points and his fatigue severity scale at 25 points. Mr. Boule describes his irregular work schedule but her dominated by long distance driving,  Going to car auctions and dealerships. Drinking coffee     Review of Systems: Out of a complete 14 system review, the patient complains of only the following symptoms, and all other reviewed systems are negative.   Epworth score 6, Fatigue severity score 35  , depression score n/a .   Social History   Social History  . Marital status: Married    Spouse name: N/A  . Number of children: 1  . Years of education: 12+   Occupational History  . Not on file.   Social History Main Topics  . Smoking status: Never Smoker  . Smokeless tobacco: Never Used  . Alcohol use 1.2 oz/week    2 Cans of beer per week     Comment: weekly   . Drug use: No  . Sexual activity: Not on file   Other Topics Concern  . Not on file   Social History Narrative   Patient lives at home girlfriend [name].   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    Family History  Problem Relation Age of Onset  . Bladder Cancer Father   . Heart failure Mother   . Valvular heart disease Mother   . Colon cancer Neg Hx   . Heart attack Neg Hx     Past Medical History:  Diagnosis Date  . Cancer ([place])    skin, left temple  . Erectile dysfunction   . HBP (high blood pressure)   . History of nuclear stress test    Myoview 3/18: EF 50, inf  thinning, no ischemia, normal wall motion; Low Risk    Past Surgical History:  Procedure Laterality Date  . NOSE SURGERY     times 2  . SKIN CANCER EXCISION  12/2016    Current Outpatient Prescriptions  Medication Sig Dispense Refill  . AMBULATORY NON FORMULARY MEDICATION Emergency B complex Takes one tablet daily    .  AMBULATORY NON FORMULARY MEDICATION L-arginine Take one tablet daily    . APPLE CIDER VINEGAR PO Take 1 Dose by mouth daily.    . COD LIVER OIL PO Take 1 Dose by mouth daily.    . Coenzyme Q10 (CO Q-10 PO) Take 1 tablet by mouth daily.    Marland Kitchen FOLIC ACID PO Take 1 tablet by mouth daily.    Marland Kitchen gabapentin (NEURONTIN) 300 MG capsule Take 1 capsule (300 mg total) by mouth 3 (three) times daily. 90 capsule 3  . GARLIC PO Take 1 tablet by mouth daily.    Marland Kitchen losartan-hydrochlorothiazide (HYZAAR) 100-25 MG tablet Take 1 tablet by mouth daily. 90 tablet 1  . metoprolol succinate (TOPROL-XL) 50 MG 24 hr tablet Take 1 tablet (50 mg total) by mouth daily. Take with or immediately following a meal. 90 tablet 1  . MILK THISTLE PO Take 1 tablet by mouth daily.    . Niacin (VITAMIN B-3 PO) Take 1 tablet by mouth daily. Reported on 05/10/2016    . NONFORMULARY OR COMPOUNDED [place] compounding:  Onychomycosis Nail Lacquer - Fluconazole 2%, Terbinafine 1%, DMSO. Apply daily to affected area. +11 refills. 480 each 11  . Omega-3 Fatty Acids (FISH OIL PO) Take 1 tablet by mouth daily.    Marland Kitchen omeprazole (PRILOSEC) 20 MG capsule Take 20 mg by mouth daily.    . Plant Sterols and Stanols (CHOLEST OFF PO) Take 1 tablet by mouth daily.    Marland Kitchen thiamine (VITAMIN B-1) 100 MG tablet Take 100 mg by mouth daily.    . tadalafil (CIALIS) 20 MG tablet Take 20 mg by mouth daily as needed for erectile dysfunction.     No current facility-administered medications for this visit.     Allergies as of 01/12/2017 - Review Complete 01/12/2017  Allergen Reaction Noted  . Ivp dye [iodinated diagnostic  agents]  08/29/2013  . Penicillins  08/29/2013  . Sulfur  08/29/2013    Vitals: BP 127/76   Pulse 73   Resp 20   Wt 247 lb (112 kg)   BMI 32.59 kg/m  Last Weight:  Wt Readings from Last 1 Encounters:  01/12/17 247 lb (112 kg)   JSH:FWYO mass index is 32.59 kg/m.     Last Height:   Ht Readings from Last 1 Encounters:  12/19/16 6\' 1"  (1.854 m)    Physical exam:  General: The patient is awake, alert and appears not in acute distress. The patient is well groomed. Head: Normocephalic, atraumatic. Neck is supple. Mallampati 2,  neck circumference:18.5 Nasal airflow restricted ,  Retrognathia is not seen.  Cardiovascular:  Regular rate and rhythm , without  murmurs or carotid bruit, and without distended neck veins. Neurologic exam : The patient is awake and alert, oriented to place and time.   Memory subjective  described as intact.  Attention span & concentration ability appears normal.  Speech is logorrheic , Mood and affect are appropriate.  Cranial nerves: Pupils are equal and briskly reactive to light. Funduscopic exam without evidence of pallor or edema.  Extraocular movements  in vertical and horizontal planes intact and without nystagmus. Visual fields by finger perimetry are intact. Hearing to finger rub intact.   Facial sensation intact to fine touch.  Facial motor strength is symmetric and tongue and uvula move midline. Shoulder shrug was symmetrical.   Motor exam: Normal tone, muscle bulk and symmetric strength in all extremities. Sensory:  Fine touch, pinprick and vibration were tested in all extremities.  Proprioception tested in the upper extremities was normal. Coordination: Rapid alternating movements in the fingers/hands was normal.  Finger-to-nose maneuver  normal without evidence of ataxia, dysmetria or tremor.  Gait and station: Patient walks without assistive device and is able unassisted to climb up to the exam table. Strength within normal limits.   Stance is stable and normal.    Deep tendon reflexes: in the  upper and lower extremities are symmetric and intact. Babinski maneuver response is downgoing.  The patient was advised of the nature of the diagnosed sleep disorder , the treatment options and risks for general a health and wellness arising from not treating the condition.  I spent more than 35 minutes of face to face time with the patient. Greater than 50% of time was spent in counseling and coordination of care. We have discussed the diagnosis and differential and I answered the patient's questions.     Assessment:  After physical and neurologic examination, review of laboratory studies,  Personal review of imaging studies, reports of other /same  Imaging studies ,  Results of polysomnography/ neurophysiology testing and pre-existing records as far as provided in visit., my assessment is   1)  Mr. Severns has some risk factors for obstructive sleep apnea, these include a larger neck size, a recently more elevated body mass index. OSA was positive AHI was 40.2 /hr  Auto-titration works well by his account. He needs to add 30 minute of sleep time. He has a very high residual AHI  At 21.7, but he removes the CPAP mask without switching the CPAP off. We discussed this.    2) One problem remaining is his very irregular sleep and wake pattern. He would probably benefit from designating one more hour to sleep.  Plan:  Treatment plan and additional workup : CPAP to be continued with behavior modification.    Sleep hygiene discussed. CPAP use discussed.     Asencion Partridge Claiborne Stroble MD  01/12/2017   CC: Darlyne Russian, [address] [place], [place] [id number]

## 2017-01-24 ENCOUNTER — Other Ambulatory Visit: Payer: Self-pay

## 2017-01-24 ENCOUNTER — Ambulatory Visit (HOSPITAL_COMMUNITY): Payer: BLUE CROSS/BLUE SHIELD | Attending: Cardiology

## 2017-01-24 ENCOUNTER — Encounter: Payer: Self-pay | Admitting: Physician Assistant

## 2017-01-24 DIAGNOSIS — E785 Hyperlipidemia, unspecified: Secondary | ICD-10-CM | POA: Diagnosis not present

## 2017-01-24 DIAGNOSIS — R0600 Dyspnea, unspecified: Secondary | ICD-10-CM | POA: Diagnosis not present

## 2017-01-24 DIAGNOSIS — I10 Essential (primary) hypertension: Secondary | ICD-10-CM | POA: Diagnosis not present

## 2017-01-24 DIAGNOSIS — R079 Chest pain, unspecified: Secondary | ICD-10-CM | POA: Diagnosis not present

## 2017-06-07 ENCOUNTER — Other Ambulatory Visit: Payer: Self-pay | Admitting: Family Medicine

## 2017-06-07 DIAGNOSIS — I10 Essential (primary) hypertension: Secondary | ICD-10-CM

## 2017-06-29 ENCOUNTER — Encounter: Payer: Self-pay | Admitting: Family Medicine

## 2017-06-29 ENCOUNTER — Ambulatory Visit (INDEPENDENT_AMBULATORY_CARE_PROVIDER_SITE_OTHER): Payer: BLUE CROSS/BLUE SHIELD

## 2017-06-29 ENCOUNTER — Ambulatory Visit (INDEPENDENT_AMBULATORY_CARE_PROVIDER_SITE_OTHER): Payer: BLUE CROSS/BLUE SHIELD | Admitting: Family Medicine

## 2017-06-29 VITALS — BP 132/82 | HR 73 | Temp 98.2°F | Resp 16 | Ht 73.0 in | Wt 244.0 lb

## 2017-06-29 DIAGNOSIS — J189 Pneumonia, unspecified organism: Secondary | ICD-10-CM

## 2017-06-29 DIAGNOSIS — R0609 Other forms of dyspnea: Secondary | ICD-10-CM

## 2017-06-29 DIAGNOSIS — R059 Cough, unspecified: Secondary | ICD-10-CM

## 2017-06-29 DIAGNOSIS — J181 Lobar pneumonia, unspecified organism: Secondary | ICD-10-CM | POA: Diagnosis not present

## 2017-06-29 DIAGNOSIS — R42 Dizziness and giddiness: Secondary | ICD-10-CM | POA: Diagnosis not present

## 2017-06-29 DIAGNOSIS — R05 Cough: Secondary | ICD-10-CM

## 2017-06-29 DIAGNOSIS — I10 Essential (primary) hypertension: Secondary | ICD-10-CM

## 2017-06-29 DIAGNOSIS — R5383 Other fatigue: Secondary | ICD-10-CM

## 2017-06-29 DIAGNOSIS — R51 Headache: Secondary | ICD-10-CM

## 2017-06-29 DIAGNOSIS — K5900 Constipation, unspecified: Secondary | ICD-10-CM

## 2017-06-29 DIAGNOSIS — R140 Abdominal distension (gaseous): Secondary | ICD-10-CM | POA: Diagnosis not present

## 2017-06-29 DIAGNOSIS — Z13220 Encounter for screening for lipoid disorders: Secondary | ICD-10-CM | POA: Diagnosis not present

## 2017-06-29 DIAGNOSIS — R0789 Other chest pain: Secondary | ICD-10-CM | POA: Diagnosis not present

## 2017-06-29 DIAGNOSIS — R519 Headache, unspecified: Secondary | ICD-10-CM

## 2017-06-29 LAB — POCT CBC
Granulocyte percent: 57.1 %G (ref 37–80)
HCT, POC: 37.4 % — AB (ref 43.5–53.7)
HEMOGLOBIN: 12.6 g/dL — AB (ref 14.1–18.1)
Lymph, poc: 2.4 (ref 0.6–3.4)
MCH, POC: 27.9 pg (ref 27–31.2)
MCHC: 33.7 g/dL (ref 31.8–35.4)
MCV: 82.6 fL (ref 80–97)
MID (CBC): 0.7 (ref 0–0.9)
MPV: 8.3 fL (ref 0–99.8)
PLATELET COUNT, POC: 218 10*3/uL (ref 142–424)
POC Granulocyte: 4.2 (ref 2–6.9)
POC LYMPH PERCENT: 32.8 %L (ref 10–50)
POC MID %: 10.1 % (ref 0–12)
RBC: 4.53 M/uL — AB (ref 4.69–6.13)
RDW, POC: 15.6 %
WBC: 7.4 10*3/uL (ref 4.6–10.2)

## 2017-06-29 LAB — POCT URINALYSIS DIP (MANUAL ENTRY)
BILIRUBIN UA: NEGATIVE
BILIRUBIN UA: NEGATIVE mg/dL
Blood, UA: NEGATIVE
GLUCOSE UA: NEGATIVE mg/dL
LEUKOCYTES UA: NEGATIVE
NITRITE UA: NEGATIVE
PH UA: 6.5 (ref 5.0–8.0)
Protein Ur, POC: NEGATIVE mg/dL
Spec Grav, UA: 1.01 (ref 1.010–1.025)
Urobilinogen, UA: 0.2 E.U./dL

## 2017-06-29 LAB — GLUCOSE, POCT (MANUAL RESULT ENTRY): POC Glucose: 81 mg/dl (ref 70–99)

## 2017-06-29 MED ORDER — AZITHROMYCIN 250 MG PO TABS: ORAL_TABLET | ORAL | 0 refills | Status: DC

## 2017-06-29 MED ORDER — METOPROLOL SUCCINATE ER 50 MG PO TB24: 50.0000 mg | ORAL_TABLET | Freq: Every day | ORAL | 1 refills | Status: DC

## 2017-06-29 MED ORDER — LOSARTAN POTASSIUM-HCTZ 100-25 MG PO TABS: 1.0000 | ORAL_TABLET | Freq: Every day | ORAL | 1 refills | Status: AC

## 2017-06-29 NOTE — Progress Notes (Addendum)
Subjective:  This chart was scribed for [name], MD by Tamsen Roers, at [place] at [facility].  This patient was seen in room 26 and the patient's care was started at 11:24 AM.    Chief Complaint  Patient presents with  . Constipation    difficulty passing stool  . Dizziness    feels light headed  . Other    Multiple complaints would like to address them all (advised that may not be possible)     Patient ID: Shane Sawyer, male    DOB: October 28, 1956, 61 y.o.   MRN: [id number]  HPI  HPI Comments: Shane Sawyer is a 61 y.o. male who presents to Primary Care at [facility] with multiple concerns today.  He has a history of hypertension, tremor- seen by neurology in 2014- thought to be essential tremor.  History of rectal bleeding evaluated by GI in 2016, thought to be hemorrhoids. colonscopy march 2016 by Dr. Fuller Plan. - 2 polyps and hemorrhoids, repeat in 5 years. He is borderline anemic with hemoglobin 13.8 in July of 2017. That improved to normal at 14.0- February 9th.  He also has a history of OSA -on CPAP followed by Dr. Brett Fairy -sleep specialist. Last visit was March 15 th with him.  He is complaining of dizziness today. ---Patient is compliant with using his CPAP.    Stress echo and myo view EF 50%, low risk study. March 2nd.  Had an echo March 27th with a normal EF of 55 to 60%.  Patients mother  had an aortic valve replaced.  She is a family of 69 and 70 of them had an aortic valve replacements.  His mother and father both had high blood pressure.  His father passed from cancer at 95.    Lightheadedness/dizziness: Patient has been having worsening lightheadedness/dizziness with associated symptoms of pressure on top of his head onset about 6 months ago.  He especially feels this when turning/jerking his head and feels the pressure today in the office. He is also complaining of some congestion with a slight cough onset two weeks ago as well as some shortness of breath/fatigue with  exertion with occasional wheezing/chest tightness.  He has been working out the past couple of weeks and drives long distances often for work.  Denies any fever, head ache,calf pain or swelling. Patient had an eye exam last week which did not show any concerns. Patient takes heart burn medication (for reflux)  daily and was not able to take one today as he ran out yesterday. Patient is not a smoker and has never smoked in the past.   Constipation- Patient has been feeling more constipated for the past two months with associated symptoms of bloating.  He does have intermittent blood in the stool but states that this is due to his hemorrhoids. Last bowel movement was last night but has been going 2 days at times without having a bowel movement.  Describes it as trying to "pass a basketball".    Hypertension: He is on Losartan HCTZ 100-25 mg QD and Metoprolol 50 mg QD. --- He is compliant with this and has not missed any doses.     Patient Active Problem List   Diagnosis Date Noted  . Rectal bleeding 09/21/2015  . Change in bowel habits 09/21/2015  . Special screening for malignant neoplasms, colon 09/21/2015  . Tremor 09/10/2013  . Unspecified essential hypertension 08/29/2013   Past Medical History:  Diagnosis Date  . Cancer ([place])  skin, left temple  . Erectile dysfunction   . HBP (high blood pressure)   . History of echocardiogram    Echo 3/18: EF 55-60, no RWMA, PASP 27  . History of nuclear stress test    Myoview 3/18: EF 50, inf thinning, no ischemia, normal wall motion; Low Risk   Past Surgical History:  Procedure Laterality Date  . NOSE SURGERY     times 2  . SKIN CANCER EXCISION  12/2016   Allergies  Allergen Reactions  . Ivp Dye [Iodinated Diagnostic Agents]     Swelling in throat, SOB  . Penicillins   . Sulfur    Prior to Admission medications   Medication Sig Start Date End Date Taking? Authorizing Provider  AMBULATORY NON FORMULARY MEDICATION Emergency B  complex Takes one tablet daily    [provider]  AMBULATORY NON FORMULARY MEDICATION L-arginine Take one tablet daily    [provider]  APPLE CIDER VINEGAR PO Take 1 Dose by mouth daily.    [provider]  COD LIVER OIL PO Take 1 Dose by mouth daily.    [provider]  Coenzyme Q10 (CO Q-10 PO) Take 1 tablet by mouth daily.    [provider]  FOLIC ACID PO Take 1 tablet by mouth daily.    [provider]  gabapentin (NEURONTIN) 300 MG capsule Take 1 capsule (300 mg total) by mouth 3 (three) times daily. 02/26/16   Wallene Huh, DPM  GARLIC PO Take 1 tablet by mouth daily.    [provider]  losartan-hydrochlorothiazide (HYZAAR) 100-25 MG tablet Take 1 tablet by mouth daily. 12/08/16   [name], MD  losartan-hydrochlorothiazide (HYZAAR) 100-25 MG tablet Take 1 tablet by mouth daily. NEEDS OFFICE VISIT FOR ADDITIONAL REFILLS 06/07/17   [name], MD  metoprolol succinate (TOPROL-XL) 50 MG 24 hr tablet Take 1 tablet (50 mg total) by mouth daily. Take with or immediately following a meal. 12/08/16   [name], MD  MILK THISTLE PO Take 1 tablet by mouth daily.    [provider]  Niacin (VITAMIN B-3 PO) Take 1 tablet by mouth daily. Reported on 05/10/2016    [provider]  NONFORMULARY OR COMPOUNDED [place]anco compounding:  Onychomycosis Nail Lacquer - Fluconazole 2%, Terbinafine 1%, DMSO. Apply daily to affected area. +11 refills. 03/07/16   Wallene Huh, DPM  Omega-3 Fatty Acids (FISH OIL PO) Take 1 tablet by mouth daily.    [provider]  omeprazole (PRILOSEC) 20 MG capsule Take 20 mg by mouth daily.    [provider]  Plant Sterols and Stanols (CHOLEST OFF PO) Take 1 tablet by mouth daily.    [provider]  tadalafil (CIALIS) 20 MG tablet Take 20 mg by mouth daily as needed for erectile dysfunction.    [provider]  thiamine  (VITAMIN B-1) 100 MG tablet Take 100 mg by mouth daily.    [provider]   Social History   Social History  . Marital status: Married    Spouse name: N/A  . Number of children: 1  . Years of education: 12+   Occupational History  . Not on file.   Social History Main Topics  . Smoking status: Never Smoker  . Smokeless tobacco: Never Used  . Alcohol use 1.2 oz/week    2 Cans of beer per week     Comment: weekly   . Drug use: No  .  Sexual activity: Not on file   Other Topics Concern  . Not on file   Social History Narrative   Patient lives at home girlfriend [name].   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     Review of Systems  Constitutional: Positive for fatigue.  HENT: Positive for congestion.   Eyes: Negative for pain, redness, itching and visual disturbance.  Respiratory: Positive for shortness of breath.   Gastrointestinal: Positive for constipation. Negative for nausea and vomiting.  Musculoskeletal: Negative for neck pain and neck stiffness.  Neurological: Positive for dizziness and light-headedness. Negative for syncope and speech difficulty.       Objective:   Physical Exam  Constitutional: He is oriented to person, place, and time. He appears well-developed and well-nourished.  HENT:  Head: Normocephalic and atraumatic.  Right Ear: Tympanic membrane, external ear and ear canal normal.  Left Ear: Tympanic membrane, external ear and ear canal normal.  Nose: No rhinorrhea.  Mouth/Throat: Oropharynx is clear and moist and mucous membranes are normal. No oropharyngeal exudate or posterior oropharyngeal erythema.  Eyes: Pupils are equal, round, and reactive to light. Conjunctivae and EOM are normal.  Neck: Neck supple. No JVD present. Carotid bruit is not present.  Cardiovascular: Normal rate, regular rhythm, normal  heart sounds and intact distal pulses.   No murmur heard. Calves are non tender, no lower extremity edema.   Pulmonary/Chest: Effort normal and breath sounds normal. He has no wheezes. He has no rhonchi. He has no rales.  Chest wall is non tender.   Abdominal: Soft. He exhibits no distension. There is no tenderness.  Normal bowel sounds.   Musculoskeletal: He exhibits no edema.  Lymphadenopathy:    He has no cervical adenopathy.  Neurological: He is alert and oriented to person, place, and time.  Non focal neuro exam, normal gait.   Skin: Skin is warm and dry. No rash noted.  Psychiatric: He has a normal mood and affect. His behavior is normal.  Vitals reviewed.   Vitals:   06/29/17 1053  BP: 132/82  Pulse: 73  Resp: 16  Temp: 98.2 F (36.8 C)  TempSrc: Other (Comment)  SpO2: 97%  Weight: 244 lb (110.7 kg)  Height: 6\' 1"  (1.854 m)   EKG: sinus rhythm, rate 67, incomplete RBBB but no apparent changes from February 2018.  Results for orders placed or performed in visit on 06/29/17  POCT CBC  Result Value Ref Range   WBC 7.4 4.6 - 10.2 K/uL   Lymph, poc 2.4 0.6 - 3.4   POC LYMPH PERCENT 32.8 10 - 50 %L   MID (cbc) 0.7 0 - 0.9   POC MID % 10.1 0 - 12 %M   POC Granulocyte 4.2 2 - 6.9   Granulocyte percent 57.1 37 - 80 %G   RBC 4.53 (A) 4.69 - 6.13 M/uL   Hemoglobin 12.6 (A) 14.1 - 18.1 g/dL   HCT, POC 37.4 (A) 43.5 - 53.7 %   MCV 82.6 80 - 97 fL   MCH, POC 27.9 27 - 31.2 pg   MCHC 33.7 31.8 - 35.4 g/dL   RDW, POC 15.6 %   Platelet Count, POC 218 142 - 424 K/uL   MPV 8.3 0 - 99.8 fL  POCT glucose (manual entry)  Result Value Ref Range   POC Glucose 81 70 - 99 mg/dl  POCT urinalysis dipstick  Result Value Ref Range   Color, UA yellow yellow   Clarity, UA clear clear   Glucose, UA negative negative mg/dL   Bilirubin, UA negative negative   Ketones, POC UA negative negative mg/dL   Spec Grav, UA 1.010 1.010 - 1.025   Blood, UA negative negative   pH, UA 6.5 5.0 -  8.0   Protein Ur, POC negative negative mg/dL   Urobilinogen, UA 0.2 0.2 or 1.0 E.U./dL   Nitrite, UA Negative Negative   Leukocytes, UA Negative Negative   Dg Chest 2 View  Result Date: 06/29/2017 CLINICAL DATA:  Two weeks of cough and dyspnea on exertion. History of hypertension, nonsmoker. EXAM: CHEST  2 VIEW COMPARISON:  Chest x-ray dating May 10, 2016 FINDINGS: The lungs are well-expanded. There are increased lung markings in the left lower lobe more conspicuous than in the past. There is no pleural effusion or pneumothorax. The heart and pulmonary vascularity are normal. The mediastinum is normal in width. The bony thorax exhibits no acute abnormality. IMPRESSION: Increased lung markings in the left lower lobe posteriorly suspicious for subsegmental atelectasis or early pneumonia. A follow-up PA and lateral chest x-ray is recommended following anticipated antibiotic therapy to assure clearing and exclude malignancy. Electronically Signed   By: David  Martinique M.D.   On: 06/29/2017 12:33       Assessment & Plan:    Xeng Kucher is a 61 y.o. male Lightheadedness - Plan: EKG 12-Lead, Comprehensive metabolic panel, TSH, D-dimer, quantitative (not at [facility]), POCT CBC, POCT glucose (manual entry), POCT urinalysis dipstick Pressure in head Fatigue, unspecified type Cough - Plan: DG Chest 2 View, D-dimer, quantitative (not at [facility]), POCT CBC DOE (dyspnea on exertion) - Plan: EKG 12-Lead, DG Chest 2 View Pneumonia of left lower lobe due to infectious organism ([place]) - Plan: azithromycin (ZITHROMAX) 250 MG tablet Chest tightness - Plan: D-dimer, quantitative (not at [facility])  -Doubt PE, d-dimer reassuring. EKG reassuring without apparent acute findings.  -Chest x-ray indicates a probable pneumonia. Start azithromycin, Z-Pak, and recheck in 4-5 days. RTC/ER precautions if worse sooner.  Constipation, unspecified constipation type Abdominal bloating  -Increase fluids, fiber diet, trial of  over-the-counter MiraLAX 1-2 times per day, and can discuss further at repeat visit in 5 days.  Essential hypertension - Plan: losartan-hydrochlorothiazide (HYZAAR) 100-25 MG tablet, metoprolol succinate (TOPROL-XL) 50 MG 24 hr tablet  -Stable, continue same dose of losartan HCTZ and Toprol. Labs pending.  Dizziness - Plan: POCT glucose (manual entry)  -Glucose reassuring, CBC without signs of anemia. Nonfocal neuro exam. Increase fluids, minimize caffeine Treat possible pneumonia as above, recheck in 5 days to determine if other workup needed.  Screening for hyperlipidemia - Plan: Lipid panel     Meds ordered this encounter  Medications  . losartan-hydrochlorothiazide (HYZAAR) 100-25 MG tablet    Sig: Take 1 tablet by mouth daily.    Dispense:  90 tablet    Refill:  1  . metoprolol succinate (TOPROL-XL) 50 MG 24 hr tablet    Sig: Take 1 tablet (50 mg total) by mouth daily. Take with or immediately following a meal.    Dispense:  90 tablet    Refill:  1  . azithromycin (ZITHROMAX) 250 MG tablet    Sig: Take 2 pills by mouth on day 1, then 1 pill by mouth per day on days 2 through 5.    Dispense:  6 tablet    Refill:  0   Patient Instructions  I will check some other tests for your lightheadedness and dizziness. For now I would like you to drink more water, less caffeine, and recheck with me on Monday or Tuesday. If any worsening of the symptoms or worsening headache, proceed to the emergency room.  Constipation may also be related to not enough fluid in the diet. Increase water in the diet, fiber, and for now MiraLAX once per day.  Blood pressure and heart rate are okay here today, but monitor your blood pressure outside the office and bring a record of this readings to your next visit on Monday or Tuesday.   Constipation, Adult Constipation is when a person has fewer bowel movements in a week than normal, has difficulty having a bowel movement, or has stools that are dry,  hard, or larger than normal. Constipation may be caused by an underlying condition. It may become worse with age if a person takes certain medicines and does not take in enough fluids. Follow these instructions at home: Eating and drinking   Eat foods that have a lot of fiber, such as fresh fruits and vegetables, whole grains, and beans.  Limit foods that are high in fat, low in fiber, or overly processed, such as french fries, hamburgers, cookies, candies, and soda.  Drink enough fluid to keep your urine clear or pale yellow. General instructions  Exercise regularly or as told by your health care provider.  Go to the restroom when you have the urge to go. Do not hold it in.  Take over-the-counter and prescription medicines only as told by your health care provider. These include any fiber supplements.  Practice pelvic floor retraining exercises, such as deep breathing while relaxing the lower abdomen and pelvic floor relaxation during bowel movements.  Watch your condition for any changes.  Keep all follow-up visits as told by your health care provider. This is important. Contact a health care provider if:  You have pain that gets worse.  You have a fever.  You do not have a bowel movement after 4 days.  You vomit.  You are not hungry.  You lose weight.  You are bleeding from the anus.  You have thin, pencil-like stools. Get help right away if:  You have a fever and your symptoms suddenly get worse.  You leak stool or have blood in your stool.  Your abdomen is bloated.  You have severe pain in your abdomen.  You feel dizzy or you faint. This information is not intended to replace advice given to you by your health care provider. Make sure you discuss any questions you have with your health care provider. Document Released: 07/15/2004 Document Revised: 05/06/2016 Document Reviewed: 04/06/2016 Elsevier Interactive Patient Education  2017 Elsevier  Inc.  Dizziness Dizziness is a common problem. It is a feeling of unsteadiness or light-headedness. You may feel like you are about to faint. Dizziness can lead to injury if you stumble or fall. Anyone can become dizzy, but dizziness is more common in older adults. This condition can be caused by a number of things, including medicines, dehydration, or illness. Follow these instructions at home: Taking these steps may help with your condition: Eating and drinking  Drink enough fluid to keep your urine clear or pale yellow. This helps to keep you from becoming dehydrated. Try to drink more clear fluids, such as water.  Do not drink alcohol.  Limit your caffeine intake if directed by your health care provider.  Limit your salt intake if directed  by your health care provider. Activity  Avoid making quick movements. ? Rise slowly from chairs and steady yourself until you feel okay. ? In the morning, first sit up on the side of the bed. When you feel okay, stand slowly while you hold onto something until you know that your balance is fine.  Move your legs often if you need to stand in one place for a long time. Tighten and relax your muscles in your legs while you are standing.  Do not drive or operate heavy machinery if you feel dizzy.  Avoid bending down if you feel dizzy. Place items in your home so that they are easy for you to reach without leaning over. Lifestyle  Do not use any tobacco products, including cigarettes, chewing tobacco, or electronic cigarettes. If you need help quitting, ask your health care provider.  Try to reduce your stress level, such as with yoga or meditation. Talk with your health care provider if you need help. General instructions  Watch your dizziness for any changes.  Take medicines only as directed by your health care provider. Talk with your health care provider if you think that your dizziness is caused by a medicine that you are taking.  Tell a  friend or a family member that you are feeling dizzy. If he or she notices any changes in your behavior, have this person call your health care provider.  Keep all follow-up visits as directed by your health care provider. This is important. Contact a health care provider if:  Your dizziness does not go away.  Your dizziness or light-headedness gets worse.  You feel nauseous.  You have reduced hearing.  You have new symptoms.  You are unsteady on your feet or you feel like the room is spinning. Get help right away if:  You vomit or have diarrhea and are unable to eat or drink anything.  You have problems talking, walking, swallowing, or using your arms, hands, or legs.  You feel generally weak.  You are not thinking clearly or you have trouble forming sentences. It may take a friend or family member to notice this.  You have chest pain, abdominal pain, shortness of breath, or sweating.  Your vision changes.  You notice any bleeding.  You have a headache.  You have neck pain or a stiff neck.  You have a fever. This information is not intended to replace advice given to you by your health care provider. Make sure you discuss any questions you have with your health care provider. Document Released: 04/12/2001 Document Revised: 03/24/2016 Document Reviewed: 10/13/2014 Elsevier Interactive Patient Education  2017 [employer].   IF you received an x-ray today, you will receive an invoice from [facility]. Please contact [facility] at [phone] with questions or concerns regarding your invoice.   IF you received labwork today, you will receive an invoice from [facility]. Please contact [facility] at [phone] with questions or concerns regarding your invoice.   Our billing staff will not be able to assist you with questions regarding bills from these companies.  You will be contacted with the lab results as soon as they are available. The fastest way to  get your results is to activate your My Chart account. Instructions are located on the last page of this paperwork. If you have not heard from [place] regarding the results in 2 weeks, please contact this office.      I personally performed the services described in this documentation, which was  scribed in my presence. The recorded information has been reviewed and considered for accuracy and completeness, addended by me as needed, and agree with information above.  Signed,   Merri Ray, MD [facility] [place].  06/29/17 7:04 PM

## 2017-06-29 NOTE — Patient Instructions (Addendum)
I will check some other tests for your lightheadedness and dizziness. For now I would like you to drink more water, less caffeine, and recheck with me on Monday or Tuesday. If any worsening of the symptoms or worsening headache, proceed to the emergency room.  Constipation may also be related to not enough fluid in the diet. Increase water in the diet, fiber, and for now MiraLAX once per day.  Blood pressure and heart rate are okay here today, but monitor your blood pressure outside the office and bring a record of this readings to your next visit on Monday or Tuesday.   Constipation, Adult Constipation is when a person has fewer bowel movements in a week than normal, has difficulty having a bowel movement, or has stools that are dry, hard, or larger than normal. Constipation may be caused by an underlying condition. It may become worse with age if a person takes certain medicines and does not take in enough fluids. Follow these instructions at home: Eating and drinking   Eat foods that have a lot of fiber, such as fresh fruits and vegetables, whole grains, and beans.  Limit foods that are high in fat, low in fiber, or overly processed, such as french fries, hamburgers, cookies, candies, and soda.  Drink enough fluid to keep your urine clear or pale yellow. General instructions  Exercise regularly or as told by your health care provider.  Go to the restroom when you have the urge to go. Do not hold it in.  Take over-the-counter and prescription medicines only as told by your health care provider. These include any fiber supplements.  Practice pelvic floor retraining exercises, such as deep breathing while relaxing the lower abdomen and pelvic floor relaxation during bowel movements.  Watch your condition for any changes.  Keep all follow-up visits as told by your health care provider. This is important. Contact a health care provider if:  You have pain that gets worse.  You have a  fever.  You do not have a bowel movement after 4 days.  You vomit.  You are not hungry.  You lose weight.  You are bleeding from the anus.  You have thin, pencil-like stools. Get help right away if:  You have a fever and your symptoms suddenly get worse.  You leak stool or have blood in your stool.  Your abdomen is bloated.  You have severe pain in your abdomen.  You feel dizzy or you faint. This information is not intended to replace advice given to you by your health care provider. Make sure you discuss any questions you have with your health care provider. Document Released: 07/15/2004 Document Revised: 05/06/2016 Document Reviewed: 04/06/2016 Elsevier Interactive Patient Education  2017 [employer]  Dizziness Dizziness is a common problem. It is a feeling of unsteadiness or light-headedness. You may feel like you are about to faint. Dizziness can lead to injury if you stumble or fall. Anyone can become dizzy, but dizziness is more common in older adults. This condition can be caused by a number of things, including medicines, dehydration, or illness. Follow these instructions at home: Taking these steps may help with your condition: Eating and drinking  Drink enough fluid to keep your urine clear or pale yellow. This helps to keep you from becoming dehydrated. Try to drink more clear fluids, such as water.  Do not drink alcohol.  Limit your caffeine intake if directed by your health care provider.  Limit your salt intake if directed by  your health care provider. Activity  Avoid making quick movements. ? Rise slowly from chairs and steady yourself until you feel okay. ? In the morning, first sit up on the side of the bed. When you feel okay, stand slowly while you hold onto something until you know that your balance is fine.  Move your legs often if you need to stand in one place for a long time. Tighten and relax your muscles in your legs while you are  standing.  Do not drive or operate heavy machinery if you feel dizzy.  Avoid bending down if you feel dizzy. Place items in your home so that they are easy for you to reach without leaning over. Lifestyle  Do not use any tobacco products, including cigarettes, chewing tobacco, or electronic cigarettes. If you need help quitting, ask your health care provider.  Try to reduce your stress level, such as with yoga or meditation. Talk with your health care provider if you need help. General instructions  Watch your dizziness for any changes.  Take medicines only as directed by your health care provider. Talk with your health care provider if you think that your dizziness is caused by a medicine that you are taking.  Tell a friend or a family member that you are feeling dizzy. If he or she notices any changes in your behavior, have this person call your health care provider.  Keep all follow-up visits as directed by your health care provider. This is important. Contact a health care provider if:  Your dizziness does not go away.  Your dizziness or light-headedness gets worse.  You feel nauseous.  You have reduced hearing.  You have new symptoms.  You are unsteady on your feet or you feel like the room is spinning. Get help right away if:  You vomit or have diarrhea and are unable to eat or drink anything.  You have problems talking, walking, swallowing, or using your arms, hands, or legs.  You feel generally weak.  You are not thinking clearly or you have trouble forming sentences. It may take a friend or family member to notice this.  You have chest pain, abdominal pain, shortness of breath, or sweating.  Your vision changes.  You notice any bleeding.  You have a headache.  You have neck pain or a stiff neck.  You have a fever. This information is not intended to replace advice given to you by your health care provider. Make sure you discuss any questions you have with  your health care provider. Document Released: 04/12/2001 Document Revised: 03/24/2016 Document Reviewed: 10/13/2014 Elsevier Interactive Patient Education  2017 [employer].   IF you received an x-ray today, you will receive an invoice from [facility]. Please contact [facility] at [phone] with questions or concerns regarding your invoice.   IF you received labwork today, you will receive an invoice from [facility]. Please contact [facility] at [phone] with questions or concerns regarding your invoice.   Our billing staff will not be able to assist you with questions regarding bills from these companies.  You will be contacted with the lab results as soon as they are available. The fastest way to get your results is to activate your My Chart account. Instructions are located on the last page of this paperwork. If you have not heard from [place] regarding the results in 2 weeks, please contact this office.

## 2017-06-30 LAB — COMPREHENSIVE METABOLIC PANEL
ALBUMIN: 4.4 g/dL (ref 3.6–4.8)
ALT: 34 IU/L (ref 0–44)
AST: 28 IU/L (ref 0–40)
Albumin/Globulin Ratio: 1.5 (ref 1.2–2.2)
Alkaline Phosphatase: 40 IU/L (ref 39–117)
BUN/Creatinine Ratio: 16 (ref 10–24)
BUN: 16 mg/dL (ref 8–27)
Bilirubin Total: 0.5 mg/dL (ref 0.0–1.2)
CALCIUM: 9.2 mg/dL (ref 8.6–10.2)
CHLORIDE: 101 mmol/L (ref 96–106)
CO2: 25 mmol/L (ref 20–29)
CREATININE: 0.99 mg/dL (ref 0.76–1.27)
GFR calc Af Amer: 95 mL/min/{1.73_m2} (ref >59)
GFR, EST NON AFRICAN AMERICAN: 82 mL/min/{1.73_m2} (ref >59)
GLUCOSE: 95 mg/dL (ref 65–99)
Globulin, Total: 3 g/dL (ref 1.5–4.5)
POTASSIUM: 4.2 mmol/L (ref 3.5–5.2)
Sodium: 141 mmol/L (ref 134–144)
TOTAL PROTEIN: 7.4 g/dL (ref 6.0–8.5)

## 2017-06-30 LAB — LIPID PANEL
CHOL/HDL RATIO: 5.7 ratio — AB (ref 0.0–5.0)
CHOLESTEROL TOTAL: 222 mg/dL — AB (ref 100–199)
HDL: 39 mg/dL — AB (ref >39)
LDL CALC: 158 mg/dL — AB (ref 0–99)
TRIGLYCERIDES: 127 mg/dL (ref 0–149)
VLDL Cholesterol Cal: 25 mg/dL (ref 5–40)

## 2017-06-30 LAB — D-DIMER, QUANTITATIVE (NOT AT ARMC): D-DIMER: 0.37 mg{FEU}/L (ref 0.00–0.49)

## 2017-06-30 LAB — TSH: TSH: 2.02 u[IU]/mL (ref 0.450–4.500)

## 2017-07-04 ENCOUNTER — Ambulatory Visit (INDEPENDENT_AMBULATORY_CARE_PROVIDER_SITE_OTHER): Payer: BLUE CROSS/BLUE SHIELD

## 2017-07-04 ENCOUNTER — Encounter: Payer: Self-pay | Admitting: Family Medicine

## 2017-07-04 ENCOUNTER — Ambulatory Visit (INDEPENDENT_AMBULATORY_CARE_PROVIDER_SITE_OTHER): Payer: BLUE CROSS/BLUE SHIELD | Admitting: Family Medicine

## 2017-07-04 VITALS — BP 137/83 | HR 66 | Temp 98.2°F | Resp 18 | Ht 72.24 in | Wt 246.6 lb

## 2017-07-04 DIAGNOSIS — R05 Cough: Secondary | ICD-10-CM

## 2017-07-04 DIAGNOSIS — L271 Localized skin eruption due to drugs and medicaments taken internally: Secondary | ICD-10-CM

## 2017-07-04 DIAGNOSIS — R059 Cough, unspecified: Secondary | ICD-10-CM

## 2017-07-04 DIAGNOSIS — J181 Lobar pneumonia, unspecified organism: Secondary | ICD-10-CM

## 2017-07-04 DIAGNOSIS — E785 Hyperlipidemia, unspecified: Secondary | ICD-10-CM | POA: Diagnosis not present

## 2017-07-04 DIAGNOSIS — R42 Dizziness and giddiness: Secondary | ICD-10-CM | POA: Diagnosis not present

## 2017-07-04 DIAGNOSIS — K625 Hemorrhage of anus and rectum: Secondary | ICD-10-CM

## 2017-07-04 DIAGNOSIS — J189 Pneumonia, unspecified organism: Secondary | ICD-10-CM

## 2017-07-04 DIAGNOSIS — D649 Anemia, unspecified: Secondary | ICD-10-CM | POA: Diagnosis not present

## 2017-07-04 DIAGNOSIS — R5383 Other fatigue: Secondary | ICD-10-CM | POA: Diagnosis not present

## 2017-07-04 LAB — POCT CBC
Granulocyte percent: 55.8 %G (ref 37–80)
HEMATOCRIT: 38 % — AB (ref 43.5–53.7)
Hemoglobin: 12.5 g/dL — AB (ref 14.1–18.1)
LYMPH, POC: 3 (ref 0.6–3.4)
MCH, POC: 27.1 pg (ref 27–31.2)
MCHC: 33 g/dL (ref 31.8–35.4)
MCV: 82.1 fL (ref 80–97)
MID (cbc): 0.9 (ref 0–0.9)
MPV: 8.1 fL (ref 0–99.8)
POC GRANULOCYTE: 4.9 (ref 2–6.9)
POC LYMPH %: 34.3 % (ref 10–50)
POC MID %: 9.9 % (ref 0–12)
Platelet Count, POC: 216 10*3/uL (ref 142–424)
RBC: 4.63 M/uL — AB (ref 4.69–6.13)
RDW, POC: 15.9 %
WBC: 8.7 10*3/uL (ref 4.6–10.2)

## 2017-07-04 NOTE — Patient Instructions (Addendum)
  At your level of cholesterol reading I would typically recommend starting a statin medication. If you want to hold off on a statin at this time, and work on diet changes, would recommend rechecking levels in the next 3-6 months at most   For dizziness and tingling in fingers and toes, I will check some other blood tests, but will also refer you to neurology. Return to the clinic or go to the nearest emergency room if any of your symptoms worsen or new symptoms occur.  Chest x-ray stable. If any worsening shortness of breath, fevers, or other worsening, return here or emergency room.  Follow-up for your cough and pneumonia in the next 10 days. Make sure you drink plenty of fluids in the meantime, Mucinex as needed for expectorant.  We can also recheck your blood count at that time as it is stable today but borderline low.  If any dark/tarry colored stools, abdominal pain, or worsening blood per rectum, return here or emergency room.      IF you received an x-ray today, you will receive an invoice from [facility]. Please contact [facility] at [phone] with questions or concerns regarding your invoice.   IF you received labwork today, you will receive an invoice from [facility]. Please contact [facility] at [phone] with questions or concerns regarding your invoice.   Our billing staff will not be able to assist you with questions regarding bills from these companies.  You will be contacted with the lab results as soon as they are available. The fastest way to get your results is to activate your My Chart account. Instructions are located on the last page of this paperwork. If you have not heard from [place] regarding the results in 2 weeks, please contact this office.

## 2017-07-04 NOTE — Progress Notes (Addendum)
Subjective:  By signing my name below, I, Shane Sawyer, attest that this documentation has been prepared under the direction and in the presence of [name], MD Electronically Signed: Ladene Artist, ED Scribe 07/04/2017 at 6:04 PM.   Patient ID: Shane Sawyer, male    DOB: 10/28/1956, 61 y.o.   MRN: [id number]  Chief Complaint  Patient presents with  . Follow-up    F/u- lightheadedness, dizziness, constipation   HPI Shane Sawyer is a 61 y.o. male who presents to Primary Care at [facility] complaining of follow-up. Last seen 8/30 for multiple concerns.   Light-headedness, Fatigue and Cough  Suspected L lower lobe pneumonia and started on azithromycin. Pt still experiencing fatigue, cough, light-headedness with changing positions x 6 months, fatigue, worsened chest congestion since last visit. He finished his z-pak yesterday. Denies fever, HAs, chest pain, heart palpitations, wheezing. See previous visit regarding prior work-up. Low risk, no ischemia on March 2018 stress testing with EF 55-60% by Dr. Katharina Caper. H/o OSA on cpap. Last seen 3/15 by Dr. Brett Fairy.  Numbness Pt has also noticed intermittent numbness to all fingertips and both feet x 6 months as well.   Constipation   Discussed fluids, fiber and Miralax at visit 5 days ago. Pt reports intermittent blood in stools for a while with the last one being last night. He reports seeing bright red blood in the toilet last night with "unformed stools that were not quite runny". Pt reports h/o hemorrhoids.   Patient Active Problem List   Diagnosis Date Noted  . Rectal bleeding 09/21/2015  . Change in bowel habits 09/21/2015  . Special screening for malignant neoplasms, colon 09/21/2015  . Tremor 09/10/2013  . Unspecified essential hypertension 08/29/2013   Past Medical History:  Diagnosis Date  . Cancer ([place])    skin, left temple  . Erectile dysfunction   . HBP (high blood pressure)   . History of echocardiogram    Echo 3/18: EF  55-60, no RWMA, PASP 27  . History of nuclear stress test    Myoview 3/18: EF 50, inf thinning, no ischemia, normal wall motion; Low Risk   Past Surgical History:  Procedure Laterality Date  . NOSE SURGERY     times 2  . SKIN CANCER EXCISION  12/2016   Allergies  Allergen Reactions  . Ivp Dye [Iodinated Diagnostic Agents]     Swelling in throat, SOB  . Penicillins   . Sulfur    Prior to Admission medications   Medication Sig Start Date End Date Taking? Authorizing Provider  AMBULATORY NON FORMULARY MEDICATION Emergency B complex Takes one tablet daily    [provider]  AMBULATORY NON FORMULARY MEDICATION L-arginine Take one tablet daily    [provider]  APPLE CIDER VINEGAR PO Take 1 Dose by mouth daily.    [provider]  azithromycin (ZITHROMAX) 250 MG tablet Take 2 pills by mouth on day 1, then 1 pill by mouth per day on days 2 through 5. 06/29/17   [name], MD  COD LIVER OIL PO Take 1 Dose by mouth daily.    [provider]  Coenzyme Q10 (CO Q-10 PO) Take 1 tablet by mouth daily.    [provider]  FOLIC ACID PO Take 1 tablet by mouth daily.    [provider]  GARLIC PO Take 1 tablet by mouth daily.    [provider]  losartan-hydrochlorothiazide (HYZAAR) 100-25 MG tablet Take 1 tablet by mouth daily. 06/29/17  [name], MD  metoprolol succinate (TOPROL-XL) 50 MG 24 hr tablet Take 1 tablet (50 mg total) by mouth daily. Take with or immediately following a meal. 06/29/17   [name], MD  MILK THISTLE PO Take 1 tablet by mouth daily.    [provider]  Niacin (VITAMIN B-3 PO) Take 1 tablet by mouth daily. Reported on 05/10/2016    [provider]  Omega-3 Fatty Acids (FISH OIL PO) Take 1 tablet by mouth daily.    [provider]  omeprazole (PRILOSEC) 20 MG capsule Take 20 mg by mouth daily.    [provider]  Plant Sterols and Stanols (CHOLEST OFF  PO) Take 1 tablet by mouth daily.    [provider]  tadalafil (CIALIS) 20 MG tablet Take 20 mg by mouth daily as needed for erectile dysfunction.    [provider]  thiamine (VITAMIN B-1) 100 MG tablet Take 100 mg by mouth daily.    [provider]   Social History   Social History  . Marital status: Married    Spouse name: N/A  . Number of children: 1  . Years of education: 12+   Occupational History  . Not on file.   Social History Main Topics  . Smoking status: Never Smoker  . Smokeless tobacco: Never Used  . Alcohol use 1.2 oz/week    2 Cans of beer per week     Comment: weekly   . Drug use: No  . Sexual activity: Not on file   Other Topics Concern  . Not on file   Social History Narrative   Patient lives at home girlfriend Shane Sawyer.   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   Review of Systems  Constitutional: Positive for fatigue. Negative for fever.  Respiratory: Positive for cough. Negative for wheezing.   Cardiovascular: Negative for chest pain and palpitations.  Gastrointestinal: Positive for blood in stool.  Genitourinary: Positive for frequency (chronic).  Neurological: Positive for light-headedness and numbness. Negative for headaches.      Objective:   Physical Exam  Constitutional: He is oriented to person, place, and time. He appears well-developed and well-nourished.  HENT:  Head: Normocephalic and atraumatic.  Eyes: Pupils are equal, round, and reactive to light. EOM are normal.  Neck: No JVD present. Carotid bruit is not present.  Cardiovascular: Normal rate, regular rhythm and normal heart sounds.   No murmur heard. Pulmonary/Chest: Effort normal and breath sounds normal. He has no rales.  Abdominal: Soft. He exhibits no distension. There is no tenderness.  Genitourinary:  Genitourinary  Comments: No blood seen. A few flat hemorrhoids.  Musculoskeletal: He exhibits no edema.  Trace pedal edema.  Neurological: He is alert and oriented to person, place, and time. He displays a negative Romberg sign.  No pronator drift. Heel to toe initially unstable but corrects normally. Normal finger to nose.   Skin: Skin is warm and dry.  Psychiatric: He has a normal mood and affect.  Vitals reviewed.  Vitals:   07/04/17 1751  BP: 137/83  Pulse: 66  Resp: 18  Temp: 98.2 F (36.8 C)  TempSrc: Oral  SpO2: 96%  Weight: 246 lb 9.6 oz (111.9 kg)  Height: 6' 0.24" (1.835 m)   Dg Chest 2 View  Result Date: 07/04/2017 CLINICAL DATA:  Left lower lobe infiltrate  follow-up, increase congestion EXAM: CHEST  2 VIEW COMPARISON:  06/29/2017, 05/10/2016 FINDINGS: Persistent streaky opacity at the left lung base. No new consolidation or effusion. Normal cardiomediastinal silhouette. No pneumothorax. IMPRESSION: Persistent left lung base streaky opacity without interval change. Radiographic follow-up to resolution recommended. There are no new infiltrates seen Electronically Signed   By: Donavan Foil M.D.   On: 07/04/2017 18:34   Dg Chest 2 View  Result Date: 06/29/2017 CLINICAL DATA:  Two weeks of cough and dyspnea on exertion. History of hypertension, nonsmoker. EXAM: CHEST  2 VIEW COMPARISON:  Chest x-ray dating May 10, 2016 FINDINGS: The lungs are well-expanded. There are increased lung markings in the left lower lobe more conspicuous than in the past. There is no pleural effusion or pneumothorax. The heart and pulmonary vascularity are normal. The mediastinum is normal in width. The bony thorax exhibits no acute abnormality. IMPRESSION: Increased lung markings in the left lower lobe posteriorly suspicious for subsegmental atelectasis or early pneumonia. A follow-up PA and lateral chest x-ray is recommended following anticipated antibiotic therapy to assure clearing and exclude malignancy.  Electronically Signed   By: David  Martinique M.D.   On: 06/29/2017 12:33      Assessment & Plan:  Shane Sawyer is a 61 y.o. male Episodic lightheadedness - Plan: Ambulatory referral to Neurology Palmar plantar dysesthesia - Plan: Vitamin B12, VITAMIN D 25 Hydroxy (Vit-D Deficiency, Fractures), Ambulatory referral to Neurology Fatigue, unspecified type  - Long-standing episodic lightheadedness. Previous cardiac workup earlier this yearreassuring. CBC with borderline anemia, unlikely cause of dizziness, and hemoglobin has been stable from last visit to current., normal TSH, nonfocal neuro exam. Differential includes generalized fatigue, electrolyte deficiency with newly reported symptoms of dysesthesias in hands and feet, or sinus/middle ear disease with vertigo.   -Check B12, vitamin D, refer to neurology, but if more sinus/ear symptoms, and consider ENT evaluation. ER/RTC precautions if worsening  Cough - Plan: DG Chest 2 View Pneumonia of left lower lobe due to infectious organism [facility]) - Plan: DG Chest 2 View  -Stable, reassuring CBC, chest x-ray stable. Continue symptomatic care with Mucinex, fluids, rest, recheck in 10 days, sooner if worse. Would recommend repeat chest x-ray in 4-6 weeks to document clearing of the affected area.  Anemia, unspecified type - Plan: POCT CBC BRBPR (bright red blood per rectum) - Plan: POCT CBC  -History of hemorrhoids and recent constipation. CBC stable. Likely hemorrhoidal bleed. Exam without sign of bleeding in office.  -Continue fluids, fiber, MiraLAX for constipation. RTC/ER precautions if worsening including persistent rectal bleeding, or worsening dizziness.  Hyperlipidemia, unspecified hyperlipidemia type  -Elevated readings discussed, and recommended statin. He would like to approach with diet changes initially with recheck levels in 3-6 months.    No orders of the defined types were placed in this encounter.  Patient Instructions    At your level  of cholesterol reading I would typically recommend starting a statin medication. If you want to hold off on a statin at this time, and work on diet changes, would recommend rechecking levels in the next 3-6 months at most   For dizziness and tingling in fingers and toes, I will check some other blood tests, but will also refer you to neurology. Return to the clinic or go to the nearest emergency room if any of your symptoms worsen or new symptoms occur.  Chest x-ray stable. If any worsening shortness of breath, fevers, or other worsening, return here or emergency room.  Follow-up for your cough  and pneumonia in the next 10 days. Make sure you drink plenty of fluids in the meantime, Mucinex as needed for expectorant.  We can also recheck your blood count at that time as it is stable today but borderline low.  If any dark/tarry colored stools, abdominal pain, or worsening blood per rectum, return here or emergency room.      IF you received an x-ray today, you will receive an invoice from [facility]. Please contact [facility] at [phone] with questions or concerns regarding your invoice.   IF you received labwork today, you will receive an invoice from [facility]. Please contact [facility] at [phone] with questions or concerns regarding your invoice.   Our billing staff will not be able to assist you with questions regarding bills from these companies.  You will be contacted with the lab results as soon as they are available. The fastest way to get your results is to activate your My Chart account. Instructions are located on the last page of this paperwork. If you have not heard from [place] regarding the results in 2 weeks, please contact this office.      I personally performed the services described in this documentation, which was scribed in my presence. The recorded information has been reviewed and considered for accuracy and completeness, addended by me as needed,  and agree with information above.  Signed,   Merri Ray, MD Primary Care at [place].  07/04/17 7:00 PM

## 2017-07-05 LAB — VITAMIN B12: Vitamin B-12: 1034 pg/mL (ref 232–1245)

## 2017-07-05 LAB — VITAMIN D 25 HYDROXY (VIT D DEFICIENCY, FRACTURES): VIT D 25 HYDROXY: 51 ng/mL (ref 30.0–100.0)

## 2017-07-08 ENCOUNTER — Other Ambulatory Visit: Payer: Self-pay | Admitting: Family Medicine

## 2017-07-08 DIAGNOSIS — I10 Essential (primary) hypertension: Secondary | ICD-10-CM

## 2017-07-14 ENCOUNTER — Ambulatory Visit: Payer: BLUE CROSS/BLUE SHIELD | Admitting: Family Medicine

## 2017-07-17 ENCOUNTER — Encounter: Payer: Self-pay | Admitting: *Deleted

## 2017-07-25 ENCOUNTER — Ambulatory Visit (INDEPENDENT_AMBULATORY_CARE_PROVIDER_SITE_OTHER): Payer: BLUE CROSS/BLUE SHIELD | Admitting: Family Medicine

## 2017-07-25 ENCOUNTER — Encounter: Payer: Self-pay | Admitting: Family Medicine

## 2017-07-25 VITALS — BP 113/69 | HR 70 | Temp 98.0°F | Resp 16 | Ht 72.24 in | Wt 245.2 lb

## 2017-07-25 DIAGNOSIS — R079 Chest pain, unspecified: Secondary | ICD-10-CM

## 2017-07-25 DIAGNOSIS — R059 Cough, unspecified: Secondary | ICD-10-CM

## 2017-07-25 DIAGNOSIS — Z789 Other specified health status: Secondary | ICD-10-CM | POA: Diagnosis not present

## 2017-07-25 DIAGNOSIS — R05 Cough: Secondary | ICD-10-CM | POA: Diagnosis not present

## 2017-07-25 DIAGNOSIS — R938 Abnormal findings on diagnostic imaging of other specified body structures: Secondary | ICD-10-CM

## 2017-07-25 DIAGNOSIS — R9389 Abnormal findings on diagnostic imaging of other specified body structures: Secondary | ICD-10-CM

## 2017-07-25 NOTE — Progress Notes (Signed)
Subjective:  By signing my name below, I, Shane Sawyer, attest that this documentation has been prepared under the direction and in the presence of Shane Agreste, MD Electronically Signed: Ladene Artist, ED Scribe 07/25/2017 at 4:52 PM.   Patient ID: Shane Sawyer, male    DOB: [birth year], 61 y.o.   MRN: [id number]  Chief Complaint  Patient presents with  . Follow-up    pneumonia recheck , seen in Urgent care Saturday    HPI Shane Sawyer is a 61 y.o. male who presents to Primary Care at [facility] for follow-up of pneumonia. Pt was seen at Urgent Care on 9/22. He brought a copy of his CXR from 9/22 on CD with images only, no apparent reading. Does appear to have some L lower lobe, possible lingular L infiltrate. He was seen 8/30, possible pneumonia with L lower lobe, increase markings. Treated with z-pak, symptomatically improved with normal lung exam. At follow-up on 9/4, planned for repeat XR in 4-6 weeks. 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 Stress echo and myoview low risk study 3/2 and normal EF on echo 3/27.   Pt states that cough initially seemed to improve following last visit but then worsened. He was seen at urgent care for gradually worsening squeezing, itching, burning chest pain with cough and chest congestion x 1 week. Also reports that he noticed that deep breaths caused pain towards the end of last week and coughing. Pt was started on doxycyline 100 mg bid, ProAir as needed and Mucinex. He has used ProAir a few times daily; states he takes it in the morning and at night for cough. Denies fever, wheezing, calf swelling, calf pain. Pt drives 7 hours every other weekend straight through. His 67 y.o. daughter has also been fighting an infection recently. No h/o asthma or tobacco use. . Patient Active Problem List   Diagnosis Date Noted  . Rectal bleeding 09/21/2015  . Change in bowel habits 09/21/2015  . Special screening for malignant neoplasms, colon 09/21/2015  .  Tremor 09/10/2013  . Unspecified essential hypertension 08/29/2013   Past Medical History:  Diagnosis Date  . Cancer ([place])    skin, left temple  . Erectile dysfunction   . HBP (high blood pressure)   . History of echocardiogram    Echo 3/18: EF 55-60, no RWMA, PASP 27  . History of nuclear stress test    Myoview 3/18: EF 50, inf thinning, no ischemia, normal wall motion; Low Risk   Past Surgical History:  Procedure Laterality Date  . NOSE SURGERY     times 2  . SKIN CANCER EXCISION  12/2016   Allergies  Allergen Reactions  . Ivp Dye [Iodinated Diagnostic Agents]     Swelling in throat, SOB  . Penicillins   . Sulfur    Prior to Admission medications   Medication Sig Start Date End Date Taking? Authorizing Provider  AMBULATORY NON FORMULARY MEDICATION Emergency B complex Takes one tablet daily    [provider]  AMBULATORY NON FORMULARY MEDICATION L-arginine Take one tablet daily    [provider]  APPLE CIDER VINEGAR PO Take 1 Dose by mouth daily.    [provider]  azithromycin (ZITHROMAX) 250 MG tablet Take 2 pills by mouth on day 1, then 1 pill by mouth per day on days 2 through 5. 06/29/17   Shane Agreste, MD  COD LIVER OIL PO Take 1 Dose by mouth daily.    [provider]  Coenzyme Q10 (CO Q-10 PO) Take 1 tablet by mouth daily.    [provider]  FOLIC ACID PO Take 1 tablet by mouth daily.    [provider]  GARLIC PO Take 1 tablet by mouth daily.    [provider]  losartan-hydrochlorothiazide (HYZAAR) 100-25 MG tablet Take 1 tablet by mouth daily. 06/29/17   Shane Agreste, MD  metoprolol succinate (TOPROL-XL) 50 MG 24 hr tablet Take 1 tablet (50 mg total) by mouth daily. Take with or immediately following a meal. 06/29/17   Shane Agreste, MD  MILK THISTLE PO Take 1 tablet by mouth daily.    [provider]  Niacin (VITAMIN B-3 PO) Take 1 tablet by mouth daily. Reported on 05/10/2016     [provider]  Omega-3 Fatty Acids (FISH OIL PO) Take 1 tablet by mouth daily.    [provider]  omeprazole (PRILOSEC) 20 MG capsule Take 20 mg by mouth daily.    [provider]  Plant Sterols and Stanols (CHOLEST OFF PO) Take 1 tablet by mouth daily.    [provider]  tadalafil (CIALIS) 20 MG tablet Take 20 mg by mouth daily as needed for erectile dysfunction.    [provider]  thiamine (VITAMIN B-1) 100 MG tablet Take 100 mg by mouth daily.    [provider]   Social History   Social History  . Marital status: Married    Spouse name: N/A  . Number of children: 1  . Years of education: 12+   Occupational History  . Not on file.   Social History Main Topics  . Smoking status: Never Smoker  . Smokeless tobacco: Never Used  . Alcohol use 1.2 oz/week    2 Cans of beer per week     Comment: weekly   . Drug use: No  . Sexual activity: Not on file   Other Topics Concern  . Not on file   Social History Narrative   Patient lives at home girlfriend [name].   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   Review of Systems  Constitutional: Negative for fever.  Respiratory: Negative for wheezing.   Cardiovascular: Positive for chest pain (with cough). Negative for leg swelling.      Objective:   Physical Exam  Constitutional: He is oriented to person, place, and time. He appears well-developed and well-nourished.  HENT:  Head: Normocephalic and atraumatic.  Eyes: Pupils are equal, round, and reactive to light. EOM are normal.  Neck: No JVD present. Carotid bruit is not present.  Cardiovascular: Normal rate, regular rhythm and normal heart sounds.   No murmur heard. Pulmonary/Chest: Effort normal and breath sounds normal. He has no rales.  Musculoskeletal: He exhibits no edema.    Neurological: He is alert and oriented to person, place, and time.  Skin: Skin is warm and dry.  Psychiatric: He has a normal mood and affect.  Vitals reviewed.  Vitals:   07/25/17 1636  BP: 113/69  Pulse: 70  Resp: 16  Temp: 98 F (36.7 C)  SpO2: 96%  Weight: 245 lb 3.2 oz (111.2 kg)  Height: 6' 0.24" (1.835 m)   EKG reading done by Shane Agreste, MD: sinus rhythm rate 64. No apparent acute findings or change from 8/30.    Assessment & Plan:  Rockford Leinen is a 61 y.o. male Cough  Chest pain, unspecified type - Plan: EKG 12-Lead, D-dimer, quantitative (not at [facility])  Abnormal CXR - Plan: D-dimer, quantitative (not at [facility])  History of recent travel  Cough, chest symptoms appear to be infectious with prior possible mild infiltrate, similar on recent urgent care xray. Also with sick contact with daughter recently treated for illness. Now back on doxycycline and improving with intermittent use of albuterol.  Vitals and exam reassuring.   -Unlikely cardiac cause with prior stress testing, and without apparent acute EKG findings, but ER/RTC chest pain precautions reviewed.   -with frequent long distance travel and some pleuritic chest symptoms, will check D Dimer, then CT chest if elevated.   -continue doxycyline for full course and albuterol or mucinex if needed for cough. If frequent albuterol need, would consider systemic or inhaled corticosteroid.   -recheck 1 week - sooner or ER if worse.     Meds ordered this encounter  Medications  . PROAIR HFA 108 (90 Base) MCG/ACT inhaler    Sig: INHALE 1 PUFF  INTO THE LUNGS Q 4 TO 6 H PRN    Refill:  0  . doxycycline (VIBRAMYCIN) 100 MG capsule    Sig: TK 1 C PO BID TK WITH FOOD    Refill:  0   Patient Instructions   I will check a d-dimer blood clot test. If that is elevated, it may be worth obtaining a CAT scan just to make sure there are no signs of blood clot. Currently symptoms appear to be due to infection or  congestion. Okay to continue the albuterol inhaler if needed, Mucinex if needed for cough, and doxycycline for now to cover for possible secondary infection. As long as you are continuing to improve, recheck with me in 1 week. If worsening, return sooner.  If any worsening chest pain or acute shortness of breath, be evaluated in the emergency room.  Return to the clinic or go to the nearest emergency room if any of your symptoms worsen or new symptoms occur.   Community-Acquired Pneumonia, Adult Pneumonia is an infection of the lungs. There are different types of pneumonia. One type can develop while a person is in a hospital. A different type, called community-acquired pneumonia, develops in people who are not, or have not recently been, in the hospital or other health care facility. What are the causes? Pneumonia may be caused by bacteria, viruses, or funguses. Community-acquired pneumonia is often caused by Streptococcus pneumonia bacteria. These bacteria are often passed from one person to another by breathing in droplets from the cough or sneeze of an infected person. What increases the risk? The condition is more likely to develop in:  People who havechronic diseases, such as chronic obstructive pulmonary disease (COPD), asthma, congestive heart failure, cystic fibrosis, diabetes, or kidney disease.  People who haveearly-stage or late-stage HIV.  People who havesickle cell disease.  People who havehad their spleen removed (splenectomy).  People who havepoor Human resources officer.  People who havemedical conditions that increase the risk of breathing in (aspirating) secretions their own mouth and nose.  People who havea weakened immune system (immunocompromised).  People who smoke.  People whotravel to areas where pneumonia-causing germs commonly exist.  People whoare around animal habitats or animals that have pneumonia-causing germs, including birds, bats, rabbits, cats, and  farm animals.  What are the signs or symptoms? Symptoms of this condition include:  Adry cough.  A wet (productive) cough.  Fever.  Sweating.  Chest pain, especially when breathing deeply or coughing.  Rapid breathing or difficulty breathing.  Shortness of breath.  Shaking chills.  Fatigue.  Muscle aches.  How is this diagnosed? Your health care provider will take a medical history and perform a physical exam. You may also have other tests, including:  Imaging studies of your chest, including X-rays.  Tests to check your blood oxygen level and other blood gases.  Other tests on blood, mucus (sputum), fluid around your lungs (pleural fluid), and urine.  If your pneumonia is severe, other tests may be done to identify the specific cause of your illness. How is this treated? The type of treatment that you receive depends on many factors, such as the cause of your pneumonia, the medicines you take, and other medical conditions that you have. For most adults, treatment and recovery from pneumonia may occur at home. In some cases, treatment must happen in a hospital. Treatment may include:  Antibiotic medicines, if the pneumonia was caused by bacteria.  Antiviral medicines, if the pneumonia was caused by a virus.  Medicines that are given by mouth or through an IV tube.  Oxygen.  Respiratory therapy.  Although rare, treating severe pneumonia may include:  Mechanical ventilation. This is done if you are not breathing well on your own and you cannot maintain a safe blood oxygen level.  Thoracentesis. This procedureremoves fluid around one lung or both lungs to help you breathe better.  Follow these instructions at home:  Take over-the-counter and prescription medicines only as told by your health care provider. ? Only takecough medicine if you are losing sleep. Understand that cough medicine can prevent your body's natural ability to remove mucus from your  lungs. ? If you were prescribed an antibiotic medicine, take it as told by your health care provider. Do not stop taking the antibiotic even if you start to feel better.  Sleep in a semi-upright position at night. Try sleeping in a reclining chair, or place a few pillows under your head.  Do not use tobacco products, including cigarettes, chewing tobacco, and e-cigarettes. If you need help quitting, ask your health care provider.  Drink enough water to keep your urine clear or pale yellow. This will help to thin out mucus secretions in your lungs. How is this prevented? There are ways that you can decrease your risk of developing community-acquired pneumonia. Consider getting a pneumococcal vaccine if:  You are older than 61 years of age.  You are older than 61 years of age and are undergoing cancer treatment, have chronic lung disease, or have other medical conditions that affect your immune system. Ask your health care provider if this applies to you.  There are different types and schedules of pneumococcal vaccines. Ask your health care provider which vaccination option is best for you. You may also prevent community-acquired pneumonia if you take these actions:  Get an influenza vaccine every year. Ask your health care provider which type of influenza vaccine is best for you.  Go to the dentist on a regular basis.  Wash your hands often. Use hand sanitizer if soap and water are not available.  Contact a health care provider if:  You have a fever.  You are losing sleep because you cannot control your cough with cough medicine. Get help right away if:  You have worsening shortness of breath.  You have increased chest pain.  Your sickness becomes worse, especially if you are an older adult or have  a weakened immune system.  You cough up blood. This information is not intended to replace advice given to you by your health care provider. Make sure you discuss any questions you  have with your health care provider. Document Released: 10/17/2005 Document Revised: 02/25/2016 Document Reviewed: 02/11/2015 Elsevier Interactive Patient Education  2017 [employer]  Nonspecific Chest Pain Chest pain can be caused by many different conditions. There is always a chance that your pain could be related to something serious, such as a heart attack or a blood clot in your lungs. Chest pain can also be caused by conditions that are not life-threatening. If you have chest pain, it is very important to follow up with your health care provider. What are the causes? Causes of this condition include:  Heartburn.  Pneumonia or bronchitis.  Anxiety or stress.  Inflammation around your heart (pericarditis) or lung (pleuritis or pleurisy).  A blood clot in your lung.  A collapsed lung (pneumothorax). This can develop suddenly on its own (spontaneous pneumothorax) or from trauma to the chest.  Shingles infection (varicella-zoster virus).  Heart attack.  Damage to the bones, muscles, and cartilage that make up your chest wall. This can include: ? Bruised bones due to injury. ? Strained muscles or cartilage due to frequent or repeated coughing or overwork. ? Fracture to one or more ribs. ? Sore cartilage due to inflammation (costochondritis).  What increases the risk? Risk factors for this condition may include:  Activities that increase your risk for trauma or injury to your chest.  Respiratory infections or conditions that cause frequent coughing.  Medical conditions or overeating that can cause heartburn.  Heart disease or family history of heart disease.  Conditions or health behaviors that increase your risk of developing a blood clot.  Having had chicken pox (varicella zoster).  What are the signs or symptoms? Chest pain can feel like:  Burning or tingling on the surface of your chest or deep in your chest.  Crushing, pressure, aching, or squeezing  pain.  Dull or sharp pain that is worse when you move, cough, or take a deep breath.  Pain that is also felt in your back, neck, shoulder, or arm, or pain that spreads to any of these areas.  Your chest pain may come and go, or it may stay constant. How is this diagnosed? Lab tests or other studies may be needed to find the cause of your pain. Your health care provider may have you take a test called an ECG (electrocardiogram). An ECG records your heartbeat patterns at the time the test is performed. You may also have other tests, such as:  Transthoracic echocardiogram (TTE). In this test, sound waves are used to create a picture of the heart structures and to look at how blood flows through your heart.  Transesophageal echocardiogram (TEE).This is a more advanced imaging test that takes images from inside your body. It allows your health care provider to see your heart in finer detail.  Cardiac monitoring. This allows your health care provider to monitor your heart rate and rhythm in real time.  Holter monitor. This is a portable device that records your heartbeat and can help to diagnose abnormal heartbeats. It allows your health care provider to track your heart activity for several days, if needed.  Stress tests. These can be done through exercise or by taking medicine that makes your heart beat more quickly.  Blood tests.  Other imaging tests.  How is this treated? Treatment depends  on what is causing your chest pain. Treatment may include:  Medicines. These may include: ? Acid blockers for heartburn. ? Anti-inflammatory medicine. ? Pain medicine for inflammatory conditions. ? Antibiotic medicine, if an infection is present. ? Medicines to dissolve blood clots. ? Medicines to treat coronary artery disease (CAD).  Supportive care for conditions that do not require medicines. This may include: ? Resting. ? Applying heat or cold packs to injured areas. ? Limiting activities  until pain decreases.  Follow these instructions at home: Medicines  If you were prescribed an antibiotic, take it as told by your health care provider. Do not stop taking the antibiotic even if you start to feel better.  Take over-the-counter and prescription medicines only as told by your health care provider. Lifestyle  Do not use any products that contain nicotine or tobacco, such as cigarettes and e-cigarettes. If you need help quitting, ask your health care provider.  Do not drink alcohol.  Make lifestyle changes as directed by your health care provider. These may include: ? Getting regular exercise. Ask your health care provider to suggest some activities that are safe for you. ? Eating a heart-healthy diet. A registered dietitian can help you to learn healthy eating options. ? Maintaining a healthy weight. ? Managing diabetes, if necessary. ? Reducing stress, such as with yoga or relaxation techniques. General instructions  Avoid any activities that bring on chest pain.  If heartburn is the cause for your chest pain, raise (elevate) the head of your bed about 6 inches (15 cm) by putting blocks under the legs. Sleeping with more pillows does not effectively relieve heartburn because it only changes the position of your head.  Keep all follow-up visits as told by your health care provider. This is important. This includes any further testing if your chest pain does not go away. Contact a health care provider if:  Your chest pain does not go away.  You have a rash with blisters on your chest.  You have a fever.  You have chills. Get help right away if:  Your chest pain is worse.  You have a cough that gets worse, or you cough up blood.  You have severe pain in your abdomen.  You have severe weakness.  You faint.  You have sudden, unexplained chest discomfort.  You have sudden, unexplained discomfort in your arms, back, neck, or jaw.  You have shortness of  breath at any time.  You suddenly start to sweat, or your skin gets clammy.  You feel nauseous or you vomit.  You suddenly feel light-headed or dizzy.  Your heart begins to beat quickly, or it feels like it is skipping beats. These symptoms may represent a serious problem that is an emergency. Do not wait to see if the symptoms will go away. Get medical help right away. Call your local emergency services (911 in the [place] Do not drive yourself to the hospital. This information is not intended to replace advice given to you by your health care provider. Make sure you discuss any questions you have with your health care provider. Document Released: 07/27/2005 Document Revised: 07/11/2016 Document Reviewed: 07/11/2016 Elsevier Interactive Patient Education  2017 [employer].   IF you received an x-ray today, you will receive an invoice from [facility]. Please contact [facility] at [phone] with questions or concerns regarding your invoice.   IF you received labwork today, you will receive an invoice from [facility]. Please contact [facility] at [phone] with questions or  concerns regarding your invoice.   Our billing staff will not be able to assist you with questions regarding bills from these companies.  You will be contacted with the lab results as soon as they are available. The fastest way to get your results is to activate your My Chart account. Instructions are located on the last page of this paperwork. If you have not heard from [place] regarding the results in 2 weeks, please contact this office.      I personally performed the services described in this documentation, which was scribed in my presence. The recorded information has been reviewed and considered for accuracy and completeness, addended by me as needed, and agree with information above.  Signed,   Merri Ray, MD [facility] [place].  07/26/17 3:19 PM

## 2017-07-25 NOTE — Patient Instructions (Addendum)
I will check a d-dimer blood clot test. If that is elevated, it may be worth obtaining a CAT scan just to make sure there are no signs of blood clot. Currently symptoms appear to be due to infection or congestion. Okay to continue the albuterol inhaler if needed, Mucinex if needed for cough, and doxycycline for now to cover for possible secondary infection. As long as you are continuing to improve, recheck with me in 1 week. If worsening, return sooner.  If any worsening chest pain or acute shortness of breath, be evaluated in the emergency room.  Return to the clinic or go to the nearest emergency room if any of your symptoms worsen or new symptoms occur.   Community-Acquired Pneumonia, Adult Pneumonia is an infection of the lungs. There are different types of pneumonia. One type can develop while a person is in a hospital. A different type, called community-acquired pneumonia, develops in people who are not, or have not recently been, in the hospital or other health care facility. What are the causes? Pneumonia may be caused by bacteria, viruses, or funguses. Community-acquired pneumonia is often caused by Streptococcus pneumonia bacteria. These bacteria are often passed from one person to another by breathing in droplets from the cough or sneeze of an infected person. What increases the risk? The condition is more likely to develop in:  People who havechronic diseases, such as chronic obstructive pulmonary disease (COPD), asthma, congestive heart failure, cystic fibrosis, diabetes, or kidney disease.  People who haveearly-stage or late-stage HIV.  People who havesickle cell disease.  People who havehad their spleen removed (splenectomy).  People who havepoor Human resources officer.  People who havemedical conditions that increase the risk of breathing in (aspirating) secretions their own mouth and nose.  People who havea weakened immune system (immunocompromised).  People who  smoke.  People whotravel to areas where pneumonia-causing germs commonly exist.  People whoare around animal habitats or animals that have pneumonia-causing germs, including birds, bats, rabbits, cats, and farm animals.  What are the signs or symptoms? Symptoms of this condition include:  Adry cough.  A wet (productive) cough.  Fever.  Sweating.  Chest pain, especially when breathing deeply or coughing.  Rapid breathing or difficulty breathing.  Shortness of breath.  Shaking chills.  Fatigue.  Muscle aches.  How is this diagnosed? Your health care provider will take a medical history and perform a physical exam. You may also have other tests, including:  Imaging studies of your chest, including X-rays.  Tests to check your blood oxygen level and other blood gases.  Other tests on blood, mucus (sputum), fluid around your lungs (pleural fluid), and urine.  If your pneumonia is severe, other tests may be done to identify the specific cause of your illness. How is this treated? The type of treatment that you receive depends on many factors, such as the cause of your pneumonia, the medicines you take, and other medical conditions that you have. For most adults, treatment and recovery from pneumonia may occur at home. In some cases, treatment must happen in a hospital. Treatment may include:  Antibiotic medicines, if the pneumonia was caused by bacteria.  Antiviral medicines, if the pneumonia was caused by a virus.  Medicines that are given by mouth or through an IV tube.  Oxygen.  Respiratory therapy.  Although rare, treating severe pneumonia may include:  Mechanical ventilation. This is done if you are not breathing well on your own and you cannot maintain a safe blood oxygen  level.  Thoracentesis. This procedureremoves fluid around one lung or both lungs to help you breathe better.  Follow these instructions at home:  Take over-the-counter and prescription  medicines only as told by your health care provider. ? Only takecough medicine if you are losing sleep. Understand that cough medicine can prevent your body's natural ability to remove mucus from your lungs. ? If you were prescribed an antibiotic medicine, take it as told by your health care provider. Do not stop taking the antibiotic even if you start to feel better.  Sleep in a semi-upright position at night. Try sleeping in a reclining chair, or place a few pillows under your head.  Do not use tobacco products, including cigarettes, chewing tobacco, and e-cigarettes. If you need help quitting, ask your health care provider.  Drink enough water to keep your urine clear or pale yellow. This will help to thin out mucus secretions in your lungs. How is this prevented? There are ways that you can decrease your risk of developing community-acquired pneumonia. Consider getting a pneumococcal vaccine if:  You are older than 61 years of age.  You are older than 61 years of age and are undergoing cancer treatment, have chronic lung disease, or have other medical conditions that affect your immune system. Ask your health care provider if this applies to you.  There are different types and schedules of pneumococcal vaccines. Ask your health care provider which vaccination option is best for you. You may also prevent community-acquired pneumonia if you take these actions:  Get an influenza vaccine every year. Ask your health care provider which type of influenza vaccine is best for you.  Go to the dentist on a regular basis.  Wash your hands often. Use hand sanitizer if soap and water are not available.  Contact a health care provider if:  You have a fever.  You are losing sleep because you cannot control your cough with cough medicine. Get help right away if:  You have worsening shortness of breath.  You have increased chest pain.  Your sickness becomes worse, especially if you are an  older adult or have a weakened immune system.  You cough up blood. This information is not intended to replace advice given to you by your health care provider. Make sure you discuss any questions you have with your health care provider. Document Released: 10/17/2005 Document Revised: 02/25/2016 Document Reviewed: 02/11/2015 Elsevier Interactive Patient Education  2017 [employer]  Nonspecific Chest Pain Chest pain can be caused by many different conditions. There is always a chance that your pain could be related to something serious, such as a heart attack or a blood clot in your lungs. Chest pain can also be caused by conditions that are not life-threatening. If you have chest pain, it is very important to follow up with your health care provider. What are the causes? Causes of this condition include:  Heartburn.  Pneumonia or bronchitis.  Anxiety or stress.  Inflammation around your heart (pericarditis) or lung (pleuritis or pleurisy).  A blood clot in your lung.  A collapsed lung (pneumothorax). This can develop suddenly on its own (spontaneous pneumothorax) or from trauma to the chest.  Shingles infection (varicella-zoster virus).  Heart attack.  Damage to the bones, muscles, and cartilage that make up your chest wall. This can include: ? Bruised bones due to injury. ? Strained muscles or cartilage due to frequent or repeated coughing or overwork. ? Fracture to one or more ribs. ? Sore  cartilage due to inflammation (costochondritis).  What increases the risk? Risk factors for this condition may include:  Activities that increase your risk for trauma or injury to your chest.  Respiratory infections or conditions that cause frequent coughing.  Medical conditions or overeating that can cause heartburn.  Heart disease or family history of heart disease.  Conditions or health behaviors that increase your risk of developing a blood clot.  Having had chicken pox  (varicella zoster).  What are the signs or symptoms? Chest pain can feel like:  Burning or tingling on the surface of your chest or deep in your chest.  Crushing, pressure, aching, or squeezing pain.  Dull or sharp pain that is worse when you move, cough, or take a deep breath.  Pain that is also felt in your back, neck, shoulder, or arm, or pain that spreads to any of these areas.  Your chest pain may come and go, or it may stay constant. How is this diagnosed? Lab tests or other studies may be needed to find the cause of your pain. Your health care provider may have you take a test called an ECG (electrocardiogram). An ECG records your heartbeat patterns at the time the test is performed. You may also have other tests, such as:  Transthoracic echocardiogram (TTE). In this test, sound waves are used to create a picture of the heart structures and to look at how blood flows through your heart.  Transesophageal echocardiogram (TEE).This is a more advanced imaging test that takes images from inside your body. It allows your health care provider to see your heart in finer detail.  Cardiac monitoring. This allows your health care provider to monitor your heart rate and rhythm in real time.  Holter monitor. This is a portable device that records your heartbeat and can help to diagnose abnormal heartbeats. It allows your health care provider to track your heart activity for several days, if needed.  Stress tests. These can be done through exercise or by taking medicine that makes your heart beat more quickly.  Blood tests.  Other imaging tests.  How is this treated? Treatment depends on what is causing your chest pain. Treatment may include:  Medicines. These may include: ? Acid blockers for heartburn. ? Anti-inflammatory medicine. ? Pain medicine for inflammatory conditions. ? Antibiotic medicine, if an infection is present. ? Medicines to dissolve blood clots. ? Medicines to  treat coronary artery disease (CAD).  Supportive care for conditions that do not require medicines. This may include: ? Resting. ? Applying heat or cold packs to injured areas. ? Limiting activities until pain decreases.  Follow these instructions at home: Medicines  If you were prescribed an antibiotic, take it as told by your health care provider. Do not stop taking the antibiotic even if you start to feel better.  Take over-the-counter and prescription medicines only as told by your health care provider. Lifestyle  Do not use any products that contain nicotine or tobacco, such as cigarettes and e-cigarettes. If you need help quitting, ask your health care provider.  Do not drink alcohol.  Make lifestyle changes as directed by your health care provider. These may include: ? Getting regular exercise. Ask your health care provider to suggest some activities that are safe for you. ? Eating a heart-healthy diet. A registered dietitian can help you to learn healthy eating options. ? Maintaining a healthy weight. ? Managing diabetes, if necessary. ? Reducing stress, such as with yoga or relaxation techniques. General instructions  Avoid any activities that bring on chest pain.  If heartburn is the cause for your chest pain, raise (elevate) the head of your bed about 6 inches (15 cm) by putting blocks under the legs. Sleeping with more pillows does not effectively relieve heartburn because it only changes the position of your head.  Keep all follow-up visits as told by your health care provider. This is important. This includes any further testing if your chest pain does not go away. Contact a health care provider if:  Your chest pain does not go away.  You have a rash with blisters on your chest.  You have a fever.  You have chills. Get help right away if:  Your chest pain is worse.  You have a cough that gets worse, or you cough up blood.  You have severe pain in your  abdomen.  You have severe weakness.  You faint.  You have sudden, unexplained chest discomfort.  You have sudden, unexplained discomfort in your arms, back, neck, or jaw.  You have shortness of breath at any time.  You suddenly start to sweat, or your skin gets clammy.  You feel nauseous or you vomit.  You suddenly feel light-headed or dizzy.  Your heart begins to beat quickly, or it feels like it is skipping beats. These symptoms may represent a serious problem that is an emergency. Do not wait to see if the symptoms will go away. Get medical help right away. Call your local emergency services (911 in the [place] Do not drive yourself to the hospital. This information is not intended to replace advice given to you by your health care provider. Make sure you discuss any questions you have with your health care provider. Document Released: 07/27/2005 Document Revised: 07/11/2016 Document Reviewed: 07/11/2016 Elsevier Interactive Patient Education  2017 [employer].   IF you received an x-ray today, you will receive an invoice from [facility]. Please contact [facility] at [phone] with questions or concerns regarding your invoice.   IF you received labwork today, you will receive an invoice from [facility]. Please contact [facility] at [phone] with questions or concerns regarding your invoice.   Our billing staff will not be able to assist you with questions regarding bills from these companies.  You will be contacted with the lab results as soon as they are available. The fastest way to get your results is to activate your My Chart account. Instructions are located on the last page of this paperwork. If you have not heard from [place] regarding the results in 2 weeks, please contact this office.

## 2017-07-26 LAB — SPECIMEN STATUS

## 2017-07-26 LAB — D-DIMER, QUANTITATIVE: D-DIMER: 0.38 mg/L FEU (ref 0.00–0.49)

## 2017-07-26 NOTE — Addendum Note (Signed)
Addended by: Gari Crown D on: 07/26/2017 05:03 PM   Modules accepted: Orders

## 2017-07-31 ENCOUNTER — Ambulatory Visit (INDEPENDENT_AMBULATORY_CARE_PROVIDER_SITE_OTHER): Payer: BLUE CROSS/BLUE SHIELD

## 2017-07-31 ENCOUNTER — Ambulatory Visit (INDEPENDENT_AMBULATORY_CARE_PROVIDER_SITE_OTHER): Payer: BLUE CROSS/BLUE SHIELD | Admitting: Family Medicine

## 2017-07-31 ENCOUNTER — Encounter: Payer: Self-pay | Admitting: Family Medicine

## 2017-07-31 VITALS — BP 122/80 | HR 74 | Temp 98.1°F | Resp 16 | Ht 72.02 in | Wt 248.0 lb

## 2017-07-31 DIAGNOSIS — R0609 Other forms of dyspnea: Secondary | ICD-10-CM | POA: Diagnosis not present

## 2017-07-31 DIAGNOSIS — J22 Unspecified acute lower respiratory infection: Secondary | ICD-10-CM

## 2017-07-31 DIAGNOSIS — R059 Cough, unspecified: Secondary | ICD-10-CM

## 2017-07-31 DIAGNOSIS — R05 Cough: Secondary | ICD-10-CM | POA: Diagnosis not present

## 2017-07-31 DIAGNOSIS — R079 Chest pain, unspecified: Secondary | ICD-10-CM

## 2017-07-31 DIAGNOSIS — R12 Heartburn: Secondary | ICD-10-CM | POA: Diagnosis not present

## 2017-07-31 LAB — SPECIMEN STATUS REPORT

## 2017-07-31 LAB — D-DIMER, QUANTITATIVE (NOT AT ARMC)

## 2017-07-31 MED ORDER — BENZONATATE 100 MG PO CAPS: 100.0000 mg | ORAL_CAPSULE | Freq: Three times a day (TID) | ORAL | 0 refills | Status: DC | PRN

## 2017-07-31 NOTE — Patient Instructions (Addendum)
Based on the congestion, nasal congestion, continued cough, I suspect there still may have a component of a viral infection. Chest xray looks better today.  For the burning chest pain, that may be related to heartburn. Make sure you avoid the foods below that can worsen heartburn. Continue omeprazole once per day, and Zantac at bedtime. You can also try over the counter allegra or zyrtec once per day, and   I referred you to cardiology to discuss chest pains further, but f you have new chest pains or pressure/squeezing in the chest, I recommend you be seen in the emergency room as they can perform other testing for heart.   Return to the clinic or go to the nearest emergency room if any of your symptoms worsen or new symptoms occur.   Nonspecific Chest Pain Chest pain can be caused by many different conditions. There is always a chance that your pain could be related to something serious, such as a heart attack or a blood clot in your lungs. Chest pain can also be caused by conditions that are not life-threatening. If you have chest pain, it is very important to follow up with your health care provider. What are the causes? Causes of this condition include:  Heartburn.  Pneumonia or bronchitis.  Anxiety or stress.  Inflammation around your heart (pericarditis) or lung (pleuritis or pleurisy).  A blood clot in your lung.  A collapsed lung (pneumothorax). This can develop suddenly on its own (spontaneous pneumothorax) or from trauma to the chest.  Shingles infection (varicella-zoster virus).  Heart attack.  Damage to the bones, muscles, and cartilage that make up your chest wall. This can include: ? Bruised bones due to injury. ? Strained muscles or cartilage due to frequent or repeated coughing or overwork. ? Fracture to one or more ribs. ? Sore cartilage due to inflammation (costochondritis).  What increases the risk? Risk factors for this condition may include:  Activities that  increase your risk for trauma or injury to your chest.  Respiratory infections or conditions that cause frequent coughing.  Medical conditions or overeating that can cause heartburn.  Heart disease or family history of heart disease.  Conditions or health behaviors that increase your risk of developing a blood clot.  Having had chicken pox (varicella zoster).  What are the signs or symptoms? Chest pain can feel like:  Burning or tingling on the surface of your chest or deep in your chest.  Crushing, pressure, aching, or squeezing pain.  Dull or sharp pain that is worse when you move, cough, or take a deep breath.  Pain that is also felt in your back, neck, shoulder, or arm, or pain that spreads to any of these areas.  Your chest pain may come and go, or it may stay constant. How is this diagnosed? Lab tests or other studies may be needed to find the cause of your pain. Your health care provider may have you take a test called an ECG (electrocardiogram). An ECG records your heartbeat patterns at the time the test is performed. You may also have other tests, such as:  Transthoracic echocardiogram (TTE). In this test, sound waves are used to create a picture of the heart structures and to look at how blood flows through your heart.  Transesophageal echocardiogram (TEE).This is a more advanced imaging test that takes images from inside your body. It allows your health care provider to see your heart in finer detail.  Cardiac monitoring. This allows your health care  provider to monitor your heart rate and rhythm in real time.  Holter monitor. This is a portable device that records your heartbeat and can help to diagnose abnormal heartbeats. It allows your health care provider to track your heart activity for several days, if needed.  Stress tests. These can be done through exercise or by taking medicine that makes your heart beat more quickly.  Blood tests.  Other imaging  tests.  How is this treated? Treatment depends on what is causing your chest pain. Treatment may include:  Medicines. These may include: ? Acid blockers for heartburn. ? Anti-inflammatory medicine. ? Pain medicine for inflammatory conditions. ? Antibiotic medicine, if an infection is present. ? Medicines to dissolve blood clots. ? Medicines to treat coronary artery disease (CAD).  Supportive care for conditions that do not require medicines. This may include: ? Resting. ? Applying heat or cold packs to injured areas. ? Limiting activities until pain decreases.  Follow these instructions at home: Medicines  If you were prescribed an antibiotic, take it as told by your health care provider. Do not stop taking the antibiotic even if you start to feel better.  Take over-the-counter and prescription medicines only as told by your health care provider. Lifestyle  Do not use any products that contain nicotine or tobacco, such as cigarettes and e-cigarettes. If you need help quitting, ask your health care provider.  Do not drink alcohol.  Make lifestyle changes as directed by your health care provider. These may include: ? Getting regular exercise. Ask your health care provider to suggest some activities that are safe for you. ? Eating a heart-healthy diet. A registered dietitian can help you to learn healthy eating options. ? Maintaining a healthy weight. ? Managing diabetes, if necessary. ? Reducing stress, such as with yoga or relaxation techniques. General instructions  Avoid any activities that bring on chest pain.  If heartburn is the cause for your chest pain, raise (elevate) the head of your bed about 6 inches (15 cm) by putting blocks under the legs. Sleeping with more pillows does not effectively relieve heartburn because it only changes the position of your head.  Keep all follow-up visits as told by your health care provider. This is important. This includes any further  testing if your chest pain does not go away. Contact a health care provider if:  Your chest pain does not go away.  You have a rash with blisters on your chest.  You have a fever.  You have chills. Get help right away if:  Your chest pain is worse.  You have a cough that gets worse, or you cough up blood.  You have severe pain in your abdomen.  You have severe weakness.  You faint.  You have sudden, unexplained chest discomfort.  You have sudden, unexplained discomfort in your arms, back, neck, or jaw.  You have shortness of breath at any time.  You suddenly start to sweat, or your skin gets clammy.  You feel nauseous or you vomit.  You suddenly feel light-headed or dizzy.  Your heart begins to beat quickly, or it feels like it is skipping beats. These symptoms may represent a serious problem that is an emergency. Do not wait to see if the symptoms will go away. Get medical help right away. Call your local emergency services (911 in the [place] Do not drive yourself to the hospital. This information is not intended to replace advice given to you by your health care provider. Make  sure you discuss any questions you have with your health care provider. Document Released: 07/27/2005 Document Revised: 07/11/2016 Document Reviewed: 07/11/2016 Elsevier Interactive Patient Education  2017 [employer]   Cough, Adult Coughing is a reflex that clears your throat and your airways. Coughing helps to heal and protect your lungs. It is normal to cough occasionally, but a cough that happens with other symptoms or lasts a long time may be a sign of a condition that needs treatment. A cough may last only 2-3 weeks (acute), or it may last longer than 8 weeks (chronic). What are the causes? Coughing is commonly caused by:  Breathing in substances that irritate your lungs.  A viral or bacterial respiratory infection.  Allergies.  Asthma.  Postnasal drip.  Smoking.  Acid  backing up from the stomach into the esophagus (gastroesophageal reflux).  Certain medicines.  Chronic lung problems, including COPD (or rarely, lung cancer).  Other medical conditions such as heart failure.  Follow these instructions at home: Pay attention to any changes in your symptoms. Take these actions to help with your discomfort:  Take medicines only as told by your health care provider. ? If you were prescribed an antibiotic medicine, take it as told by your health care provider. Do not stop taking the antibiotic even if you start to feel better. ? Talk with your health care provider before you take a cough suppressant medicine.  Drink enough fluid to keep your urine clear or pale yellow.  If the air is dry, use a cold steam vaporizer or humidifier in your bedroom or your home to help loosen secretions.  Avoid anything that causes you to cough at work or at home.  If your cough is worse at night, try sleeping in a semi-upright position.  Avoid cigarette smoke. If you smoke, quit smoking. If you need help quitting, ask your health care provider.  Avoid caffeine.  Avoid alcohol.  Rest as needed.  Contact a health care provider if:  You have new symptoms.  You cough up pus.  Your cough does not get better after 2-3 weeks, or your cough gets worse.  You cannot control your cough with suppressant medicines and you are losing sleep.  You develop pain that is getting worse or pain that is not controlled with pain medicines.  You have a fever.  You have unexplained weight loss.  You have night sweats. Get help right away if:  You cough up blood.  You have difficulty breathing.  Your heartbeat is very fast. This information is not intended to replace advice given to you by your health care provider. Make sure you discuss any questions you have with your health care provider. Document Released: 04/15/2011 Document Revised: 03/24/2016 Document Reviewed:  12/24/2014 Elsevier Interactive Patient Education  2017 [place] for Gastroesophageal Reflux Disease, Adult When you have gastroesophageal reflux disease (GERD), the foods you eat and your eating habits are very important. Choosing the right foods can help ease the discomfort of GERD. Consider working with a diet and nutrition specialist (dietitian) to help you make healthy food choices. What general guidelines should I follow? Eating plan  Choose healthy foods low in fat, such as fruits, vegetables, whole grains, low-fat dairy products, and lean meat, fish, and poultry.  Eat frequent, small meals instead of three large meals each day. Eat your meals slowly, in a relaxed setting. Avoid bending over or lying down until 2-3 hours after eating.  Limit high-fat foods such  as fatty meats or fried foods.  Limit your intake of oils, butter, and shortening to less than 8 teaspoons each day.  Avoid the following: ? Foods that cause symptoms. These may be different for different people. Keep a food diary to keep track of foods that cause symptoms. ? Alcohol. ? Drinking large amounts of liquid with meals. ? Eating meals during the 2-3 hours before bed.  Cook foods using methods other than frying. This may include baking, grilling, or broiling. Lifestyle   Maintain a healthy weight. Ask your health care provider what weight is healthy for you. If you need to lose weight, work with your health care provider to do so safely.  Exercise for at least 30 minutes on 5 or more days each week, or as told by your health care provider.  Avoid wearing clothes that fit tightly around your waist and chest.  Do not use any products that contain nicotine or tobacco, such as cigarettes and e-cigarettes. If you need help quitting, ask your health care provider.  Sleep with the head of your bed raised. Use a wedge under the mattress or blocks under the bed frame to raise the head of the  bed. What foods are not recommended? The items listed may not be a complete list. Talk with your dietitian about what dietary choices are best for you. Grains Pastries or quick breads with added fat. [place] toast. Vegetables Deep fried vegetables. [place] fries. Any vegetables prepared with added fat. Any vegetables that cause symptoms. For some people this may include tomatoes and tomato products, chili peppers, onions and garlic, and horseradish. Fruits Any fruits prepared with added fat. Any fruits that cause symptoms. For some people this may include citrus fruits, such as oranges, grapefruit, pineapple, and lemons. Meats and other protein foods High-fat meats, such as fatty beef or pork, hot dogs, ribs, ham, sausage, salami and bacon. Fried meat or protein, including fried fish and fried chicken. Nuts and nut butters. Dairy Whole milk and chocolate milk. Sour cream. Cream. Ice cream. Cream cheese. Milk shakes. Beverages Coffee and tea, with or without caffeine. Carbonated beverages. Sodas. Energy drinks. Fruit juice made with acidic fruits (such as orange or grapefruit). Tomato juice. Alcoholic drinks. Fats and oils Butter. Margarine. Shortening. Ghee. Sweets and desserts Chocolate and cocoa. Donuts. Seasoning and other foods Pepper. Peppermint and spearmint. Any condiments, herbs, or seasonings that cause symptoms. For some people, this may include curry, hot sauce, or vinegar-based salad dressings. Summary  When you have gastroesophageal reflux disease (GERD), food and lifestyle choices are very important to help ease the discomfort of GERD.  Eat frequent, small meals instead of three large meals each day. Eat your meals slowly, in a relaxed setting. Avoid bending over or lying down until 2-3 hours after eating.  Limit high-fat foods such as fatty meat or fried foods. This information is not intended to replace advice given to you by your health care provider. Make sure you discuss  any questions you have with your health care provider. Document Released: 10/17/2005 Document Revised: 10/18/2016 Document Reviewed: 10/18/2016 Elsevier Interactive Patient Education  2017 [employer].     IF you received an x-ray today, you will receive an invoice from [facility]. Please contact [facility] at [phone] with questions or concerns regarding your invoice.   IF you received labwork today, you will receive an invoice from [facility]. Please contact [facility] at [phone] with questions or concerns regarding your invoice.   Our billing staff will  not be able to assist you with questions regarding bills from these companies.  You will be contacted with the lab results as soon as they are available. The fastest way to get your results is to activate your My Chart account. Instructions are located on the last page of this paperwork. If you have not heard from [place] regarding the results in 2 weeks, please contact this office.

## 2017-07-31 NOTE — Progress Notes (Signed)
Subjective:  By signing my name below, I, [name], attest that this documentation has been prepared under the direction and in the presence of [name], MD Electronically Signed: Ladene Artist, ED Scribe 07/31/2017 at 10:16 AM.   Patient ID: Shane Sawyer, male    DOB: 1956-02-10, 61 y.o.   MRN: [id number]  Chief Complaint  Patient presents with  . Follow-up    chest tightness, short winded, itchy in center of chest up, feels neck aches when he coughs and lab results    HPI Shane Sawyer is a 61 y.o. male who presents to Primary Care at [facility] for follow-up of cough. See last visit. Cough, chest discomfort thought to be due to coughing. I saw him 9/4. XR with possible early L lower lobe pneumonia. Treated with z-pak. He was seen at urgent care prior to last visit on 9/25. Having cough, congestion and burning chest pain. Treated with doxycyline, ProAir, Mucinex. Negative d-dimer last visit that was checked due to long distance travel. EKG without apparent changes from previous. Continued on doxycyline, albuterol PRN, Mucinex PRN.    Today, pt does report symptoms of cough that radiates into his neck and causes itching in his chest, sob with activities, burning/squeezing sensation in center of chest. Pt does reports running on a tennis court while playing with his daughter yesterday and playing golf 4 days ago. He is still taking doxycyline, intermittent use of ProAir but used it last night and ibuprofen last night. Also taking Prilosec daily but still reports some breakthrough heartburn. Denies fever.  Patient Active Problem List   Diagnosis Date Noted  . Rectal bleeding 09/21/2015  . Change in bowel habits 09/21/2015  . Special screening for malignant neoplasms, colon 09/21/2015  . Tremor 09/10/2013  . Unspecified essential hypertension 08/29/2013   Past Medical History:  Diagnosis Date  . Cancer ([place])    skin, left temple  . Erectile dysfunction   . HBP (high blood  pressure)   . History of echocardiogram    Echo 3/18: EF 55-60, no RWMA, PASP 27  . History of nuclear stress test    Myoview 3/18: EF 50, inf thinning, no ischemia, normal wall motion; Low Risk   Past Surgical History:  Procedure Laterality Date  . NOSE SURGERY     times 2  . SKIN CANCER EXCISION  12/2016   Allergies  Allergen Reactions  . Ivp Dye [Iodinated Diagnostic Agents]     Swelling in throat, SOB  . Penicillins   . Sulfur    Prior to Admission medications   Medication Sig Start Date End Date Taking? Authorizing Provider  AMBULATORY NON FORMULARY MEDICATION Emergency B complex Takes one tablet daily    [provider]  AMBULATORY NON FORMULARY MEDICATION L-arginine Take one tablet daily    [provider]  APPLE CIDER VINEGAR PO Take 1 Dose by mouth daily.    [provider]  COD LIVER OIL PO Take 1 Dose by mouth daily.    [provider]  Coenzyme Q10 (CO Q-10 PO) Take 1 tablet by mouth daily.    [provider]  doxycycline (VIBRAMYCIN) 100 MG capsule TK 1 C PO BID TK WITH FOOD 07/22/17   [provider]  FOLIC ACID PO Take 1 tablet by mouth daily.    [provider]  GARLIC PO Take 1 tablet by mouth daily.    [provider]  losartan-hydrochlorothiazide (HYZAAR) 100-25 MG tablet Take 1 tablet by mouth daily.  06/29/17   [name], MD  metoprolol succinate (TOPROL-XL) 50 MG 24 hr tablet Take 1 tablet (50 mg total) by mouth daily. Take with or immediately following a meal. 06/29/17   [name], MD  MILK THISTLE PO Take 1 tablet by mouth daily.    [provider]  Niacin (VITAMIN B-3 PO) Take 1 tablet by mouth daily. Reported on 05/10/2016    [provider]  Omega-3 Fatty Acids (FISH OIL PO) Take 1 tablet by mouth daily.    [provider]  omeprazole (PRILOSEC) 20 MG capsule Take 20 mg by mouth daily.    [provider]  Plant Sterols and Stanols  (CHOLEST OFF PO) Take 1 tablet by mouth daily.    [provider]  PROAIR HFA 108 (90 Base) MCG/ACT inhaler INHALE 1 PUFF  INTO THE LUNGS Q 4 TO 6 H PRN 07/22/17   [provider]  tadalafil (CIALIS) 20 MG tablet Take 20 mg by mouth daily as needed for erectile dysfunction.    [provider]  thiamine (VITAMIN B-1) 100 MG tablet Take 100 mg by mouth daily.    [provider]   Social History   Social History  . Marital status: Married    Spouse name: N/A  . Number of children: 1  . Years of education: 12+   Occupational History  . Not on file.   Social History Main Topics  . Smoking status: Never Smoker  . Smokeless tobacco: Never Used  . Alcohol use 1.2 oz/week    2 Cans of beer per week     Comment: weekly   . Drug use: No  . Sexual activity: Not on file   Other Topics Concern  . Not on file   Social History Narrative   Patient lives at home girlfriend [name].   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   Review of Systems  Constitutional: Negative for fever.  Respiratory: Positive for cough, chest tightness and shortness of breath (with activites).       Objective:   Physical Exam  Constitutional: He is oriented to person, place, and time. He appears well-developed and well-nourished.  HENT:  Head: Normocephalic and atraumatic.  Right Ear: Tympanic membrane, external ear and ear canal normal.  Left Ear: Tympanic membrane, external ear and ear canal normal.  Nose: No rhinorrhea.  Mouth/Throat: Oropharynx is clear and moist and mucous membranes are normal. No oropharyngeal exudate or posterior oropharyngeal erythema.  Eyes: Pupils are equal, round, and reactive to light. Conjunctivae are normal.  Neck: Neck supple.  Cardiovascular: Normal rate, regular rhythm, normal heart sounds and intact distal pulses.    No murmur heard. Pulmonary/Chest: Effort normal and breath sounds normal. He has no wheezes. He has no rhonchi. He has no rales.  Abdominal: Soft. There is no tenderness.  Lymphadenopathy:    He has no cervical adenopathy.  Neurological: He is alert and oriented to person, place, and time.  Skin: Skin is warm and dry. No rash noted.  Psychiatric: He has a normal mood and affect. His behavior is normal.  Vitals reviewed.  Vitals:   07/31/17 0935  BP: 122/80  Pulse: 74  Resp: 16  Temp: 98.1 F (36.7 C)  SpO2: 97%  Weight: 248 lb (112.5 kg)  Height: 6' 0.02" (1.829 m)  Assessment & Plan:    Shane Sawyer is a 61 y.o. male Cough - Plan: DG Chest 2 View, benzonatate (TESSALON) 100 MG capsule  DOE (dyspnea on exertion) - Plan: DG Chest 2 View, benzonatate (TESSALON) 100 MG capsule  Chest pain, unspecified type - Plan: Ambulatory referral to Cardiology  LRTI (lower respiratory tract infection)  Heartburn   Persistent cough, reports of dyspnea on exertion. Reassuring chest x-ray with resolution of prior findings. Previous d-dimer was also negative, less likely DVT/PE. EKGs without acute findings. Symptoms still suspected to be due to bronchitis or early left-sided pneumonia that is improving radiographically. Afebrile, reassuring O2 sat. Some component of heartburn that may also be causing his chest symptoms. Cardiac risk factors of age, hypertension, hyperlipidemia.  -Start Tessalon for cough  -Add Zantac at bedtime, continue omeprazole once per day for heartburn. Handout on trigger foods for avoidance  -Refer to cardiology to discuss chest pains and dyspnea on exertion further. ER/911 chest pain precautions reviewed.      Meds ordered this encounter  Medications  . benzonatate (TESSALON) 100 MG capsule    Sig: Take 1 capsule (100 mg total) by mouth 3 (three) times daily as needed for cough.    Dispense:  20 capsule    Refill:  0   Patient Instructions   Based on the  congestion, nasal congestion, continued cough, I suspect there still may have a component of a viral infection. Chest xray looks better today.  For the burning chest pain, that may be related to heartburn. Make sure you avoid the foods below that can worsen heartburn. Continue omeprazole once per day, and Zantac at bedtime. You can also try over the counter allegra or zyrtec once per day, and   I referred you to cardiology to discuss chest pains further, but f you have new chest pains or pressure/squeezing in the chest, I recommend you be seen in the emergency room as they can perform other testing for heart.   Return to the clinic or go to the nearest emergency room if any of your symptoms worsen or new symptoms occur.   Nonspecific Chest Pain Chest pain can be caused by many different conditions. There is always a chance that your pain could be related to something serious, such as a heart attack or a blood clot in your lungs. Chest pain can also be caused by conditions that are not life-threatening. If you have chest pain, it is very important to follow up with your health care provider. What are the causes? Causes of this condition include:  Heartburn.  Pneumonia or bronchitis.  Anxiety or stress.  Inflammation around your heart (pericarditis) or lung (pleuritis or pleurisy).  A blood clot in your lung.  A collapsed lung (pneumothorax). This can develop suddenly on its own (spontaneous pneumothorax) or from trauma to the chest.  Shingles infection (varicella-zoster virus).  Heart attack.  Damage to the bones, muscles, and cartilage that make up your chest wall. This can include: ? Bruised bones due to injury. ? Strained muscles or cartilage due to frequent or repeated coughing or overwork. ? Fracture to one or more ribs. ? Sore cartilage due to inflammation (costochondritis).  What increases the risk? Risk factors for this condition may include:  Activities that increase your  risk for trauma or injury to your chest.  Respiratory infections or conditions that cause frequent coughing.  Medical conditions or overeating that can cause heartburn.  Heart disease or family history of heart disease.  Conditions or health behaviors that increase your risk of developing a blood clot.  Having had chicken pox (varicella zoster).  What are the signs or symptoms? Chest pain can feel like:  Burning or tingling on the surface of your chest or deep in your chest.  Crushing, pressure, aching, or squeezing pain.  Dull or sharp pain that is worse when you move, cough, or take a deep breath.  Pain that is also felt in your back, neck, shoulder, or arm, or pain that spreads to any of these areas.  Your chest pain may come and go, or it may stay constant. How is this diagnosed? Lab tests or other studies may be needed to find the cause of your pain. Your health care provider may have you take a test called an ECG (electrocardiogram). An ECG records your heartbeat patterns at the time the test is performed. You may also have other tests, such as:  Transthoracic echocardiogram (TTE). In this test, sound waves are used to create a picture of the heart structures and to look at how blood flows through your heart.  Transesophageal echocardiogram (TEE).This is a more advanced imaging test that takes images from inside your body. It allows your health care provider to see your heart in finer detail.  Cardiac monitoring. This allows your health care provider to monitor your heart rate and rhythm in real time.  Holter monitor. This is a portable device that records your heartbeat and can help to diagnose abnormal heartbeats. It allows your health care provider to track your heart activity for several days, if needed.  Stress tests. These can be done through exercise or by taking medicine that makes your heart beat more quickly.  Blood tests.  Other imaging tests.  How is this  treated? Treatment depends on what is causing your chest pain. Treatment may include:  Medicines. These may include: ? Acid blockers for heartburn. ? Anti-inflammatory medicine. ? Pain medicine for inflammatory conditions. ? Antibiotic medicine, if an infection is present. ? Medicines to dissolve blood clots. ? Medicines to treat coronary artery disease (CAD).  Supportive care for conditions that do not require medicines. This may include: ? Resting. ? Applying heat or cold packs to injured areas. ? Limiting activities until pain decreases.  Follow these instructions at home: Medicines  If you were prescribed an antibiotic, take it as told by your health care provider. Do not stop taking the antibiotic even if you start to feel better.  Take over-the-counter and prescription medicines only as told by your health care provider. Lifestyle  Do not use any products that contain nicotine or tobacco, such as cigarettes and e-cigarettes. If you need help quitting, ask your health care provider.  Do not drink alcohol.  Make lifestyle changes as directed by your health care provider. These may include: ? Getting regular exercise. Ask your health care provider to suggest some activities that are safe for you. ? Eating a heart-healthy diet. A registered dietitian can help you to learn healthy eating options. ? Maintaining a healthy weight. ? Managing diabetes, if necessary. ? Reducing stress, such as with yoga or relaxation techniques. General instructions  Avoid any activities that bring on chest pain.  If heartburn is the cause for your chest pain, raise (elevate) the head of your bed about 6 inches (15 cm) by putting blocks under the legs. Sleeping with more pillows does not effectively relieve heartburn because it only changes the position of your head.  Keep all follow-up  visits as told by your health care provider. This is important. This includes any further testing if your chest  pain does not go away. Contact a health care provider if:  Your chest pain does not go away.  You have a rash with blisters on your chest.  You have a fever.  You have chills. Get help right away if:  Your chest pain is worse.  You have a cough that gets worse, or you cough up blood.  You have severe pain in your abdomen.  You have severe weakness.  You faint.  You have sudden, unexplained chest discomfort.  You have sudden, unexplained discomfort in your arms, back, neck, or jaw.  You have shortness of breath at any time.  You suddenly start to sweat, or your skin gets clammy.  You feel nauseous or you vomit.  You suddenly feel light-headed or dizzy.  Your heart begins to beat quickly, or it feels like it is skipping beats. These symptoms may represent a serious problem that is an emergency. Do not wait to see if the symptoms will go away. Get medical help right away. Call your local emergency services (911 in the [place] Do not drive yourself to the hospital. This information is not intended to replace advice given to you by your health care provider. Make sure you discuss any questions you have with your health care provider. Document Released: 07/27/2005 Document Revised: 07/11/2016 Document Reviewed: 07/11/2016 Elsevier Interactive Patient Education  2017 Elsevier Inc.   Cough, Adult Coughing is a reflex that clears your throat and your airways. Coughing helps to heal and protect your lungs. It is normal to cough occasionally, but a cough that happens with other symptoms or lasts a long time may be a sign of a condition that needs treatment. A cough may last only 2-3 weeks (acute), or it may last longer than 8 weeks (chronic). What are the causes? Coughing is commonly caused by:  Breathing in substances that irritate your lungs.  A viral or bacterial respiratory infection.  Allergies.  Asthma.  Postnasal drip.  Smoking.  Acid backing up from the stomach  into the esophagus (gastroesophageal reflux).  Certain medicines.  Chronic lung problems, including COPD (or rarely, lung cancer).  Other medical conditions such as heart failure.  Follow these instructions at home: Pay attention to any changes in your symptoms. Take these actions to help with your discomfort:  Take medicines only as told by your health care provider. ? If you were prescribed an antibiotic medicine, take it as told by your health care provider. Do not stop taking the antibiotic even if you start to feel better. ? Talk with your health care provider before you take a cough suppressant medicine.  Drink enough fluid to keep your urine clear or pale yellow.  If the air is dry, use a cold steam vaporizer or humidifier in your bedroom or your home to help loosen secretions.  Avoid anything that causes you to cough at work or at home.  If your cough is worse at night, try sleeping in a semi-upright position.  Avoid cigarette smoke. If you smoke, quit smoking. If you need help quitting, ask your health care provider.  Avoid caffeine.  Avoid alcohol.  Rest as needed.  Contact a health care provider if:  You have new symptoms.  You cough up pus.  Your cough does not get better after 2-3 weeks, or your cough gets worse.  You cannot control your cough with suppressant  medicines and you are losing sleep.  You develop pain that is getting worse or pain that is not controlled with pain medicines.  You have a fever.  You have unexplained weight loss.  You have night sweats. Get help right away if:  You cough up blood.  You have difficulty breathing.  Your heartbeat is very fast. This information is not intended to replace advice given to you by your health care provider. Make sure you discuss any questions you have with your health care provider. Document Released: 04/15/2011 Document Revised: 03/24/2016 Document Reviewed: 12/24/2014 Elsevier Interactive  Patient Education  2017 [place] for Gastroesophageal Reflux Disease, Adult When you have gastroesophageal reflux disease (GERD), the foods you eat and your eating habits are very important. Choosing the right foods can help ease the discomfort of GERD. Consider working with a diet and nutrition specialist (dietitian) to help you make healthy food choices. What general guidelines should I follow? Eating plan  Choose healthy foods low in fat, such as fruits, vegetables, whole grains, low-fat dairy products, and lean meat, fish, and poultry.  Eat frequent, small meals instead of three large meals each day. Eat your meals slowly, in a relaxed setting. Avoid bending over or lying down until 2-3 hours after eating.  Limit high-fat foods such as fatty meats or fried foods.  Limit your intake of oils, butter, and shortening to less than 8 teaspoons each day.  Avoid the following: ? Foods that cause symptoms. These may be different for different people. Keep a food diary to keep track of foods that cause symptoms. ? Alcohol. ? Drinking large amounts of liquid with meals. ? Eating meals during the 2-3 hours before bed.  Cook foods using methods other than frying. This may include baking, grilling, or broiling. Lifestyle   Maintain a healthy weight. Ask your health care provider what weight is healthy for you. If you need to lose weight, work with your health care provider to do so safely.  Exercise for at least 30 minutes on 5 or more days each week, or as told by your health care provider.  Avoid wearing clothes that fit tightly around your waist and chest.  Do not use any products that contain nicotine or tobacco, such as cigarettes and e-cigarettes. If you need help quitting, ask your health care provider.  Sleep with the head of your bed raised. Use a wedge under the mattress or blocks under the bed frame to raise the head of the bed. What foods are not  recommended? The items listed may not be a complete list. Talk with your dietitian about what dietary choices are best for you. Grains Pastries or quick breads with added fat. [place] toast. Vegetables Deep fried vegetables. [place] fries. Any vegetables prepared with added fat. Any vegetables that cause symptoms. For some people this may include tomatoes and tomato products, chili peppers, onions and garlic, and horseradish. Fruits Any fruits prepared with added fat. Any fruits that cause symptoms. For some people this may include citrus fruits, such as oranges, grapefruit, pineapple, and lemons. Meats and other protein foods High-fat meats, such as fatty beef or pork, hot dogs, ribs, ham, sausage, salami and bacon. Fried meat or protein, including fried fish and fried chicken. Nuts and nut butters. Dairy Whole milk and chocolate milk. Sour cream. Cream. Ice cream. Cream cheese. Milk shakes. Beverages Coffee and tea, with or without caffeine. Carbonated beverages. Sodas. Energy drinks. Fruit juice made with acidic fruits (  such as orange or grapefruit). Tomato juice. Alcoholic drinks. Fats and oils Butter. Margarine. Shortening. Ghee. Sweets and desserts Chocolate and cocoa. Donuts. Seasoning and other foods Pepper. Peppermint and spearmint. Any condiments, herbs, or seasonings that cause symptoms. For some people, this may include curry, hot sauce, or vinegar-based salad dressings. Summary  When you have gastroesophageal reflux disease (GERD), food and lifestyle choices are very important to help ease the discomfort of GERD.  Eat frequent, small meals instead of three large meals each day. Eat your meals slowly, in a relaxed setting. Avoid bending over or lying down until 2-3 hours after eating.  Limit high-fat foods such as fatty meat or fried foods. This information is not intended to replace advice given to you by your health care provider. Make sure you discuss any questions you have  with your health care provider. Document Released: 10/17/2005 Document Revised: 10/18/2016 Document Reviewed: 10/18/2016 Elsevier Interactive Patient Education  2017 [employer].     IF you received an x-ray today, you will receive an invoice from [facility]. Please contact [facility] at [phone] with questions or concerns regarding your invoice.   IF you received labwork today, you will receive an invoice from [facility]. Please contact [facility] at [phone] with questions or concerns regarding your invoice.   Our billing staff will not be able to assist you with questions regarding bills from these companies.  You will be contacted with the lab results as soon as they are available. The fastest way to get your results is to activate your My Chart account. Instructions are located on the last page of this paperwork. If you have not heard from [place] regarding the results in 2 weeks, please contact this office.

## 2017-08-07 ENCOUNTER — Ambulatory Visit (INDEPENDENT_AMBULATORY_CARE_PROVIDER_SITE_OTHER): Payer: BLUE CROSS/BLUE SHIELD

## 2017-08-07 ENCOUNTER — Encounter: Payer: Self-pay | Admitting: Family Medicine

## 2017-08-07 ENCOUNTER — Ambulatory Visit (INDEPENDENT_AMBULATORY_CARE_PROVIDER_SITE_OTHER): Payer: BLUE CROSS/BLUE SHIELD | Admitting: Family Medicine

## 2017-08-07 VITALS — BP 102/74 | HR 72 | Resp 16 | Ht 72.02 in | Wt 243.8 lb

## 2017-08-07 DIAGNOSIS — K209 Esophagitis, unspecified without bleeding: Secondary | ICD-10-CM

## 2017-08-07 DIAGNOSIS — M25511 Pain in right shoulder: Secondary | ICD-10-CM

## 2017-08-07 DIAGNOSIS — M542 Cervicalgia: Secondary | ICD-10-CM

## 2017-08-07 DIAGNOSIS — K219 Gastro-esophageal reflux disease without esophagitis: Secondary | ICD-10-CM | POA: Diagnosis not present

## 2017-08-07 DIAGNOSIS — R0789 Other chest pain: Secondary | ICD-10-CM | POA: Diagnosis not present

## 2017-08-07 DIAGNOSIS — E871 Hypo-osmolality and hyponatremia: Secondary | ICD-10-CM

## 2017-08-07 DIAGNOSIS — R42 Dizziness and giddiness: Secondary | ICD-10-CM | POA: Diagnosis not present

## 2017-08-07 NOTE — Patient Instructions (Addendum)
Keep follow up with cardiology as planned for breathing and chest symptoms, and I will also refer you to gastroenterology for possible esophagitis. Continue omeprazole once per day, and carafate for now.  Return to the clinic or go to the nearest emergency room if any of your symptoms worsen or new symptoms occur.  For hand, feet, face numbness/tingling - I'm glad to hear that those symptoms are improving. Keep follow up with neurology and cardiology as planned. If those symptoms worsen, return here or the emergency room.   For lightheadedness, you can try decreasing your metoprolol to one half pill each day. Recheck in the next week to 10 days if those symptoms have not improved. If acute worsening, proceed to the emergency room.   I will refer you to orthopedics for the neck pain, right shoulder pain. With current possible esophagus issues, Tylenol may be the safest medication for now. He can also use heat or ice to the your neck with gentle range of motion and stretching throughout the day.  Return to the clinic or go to the nearest emergency room if any of your symptoms worsen or new symptoms occur.    Esophagitis Esophagitis is inflammation of the esophagus. The esophagus is the tube that carries food and liquids from your mouth to your stomach. Esophagitis can cause soreness or pain in the esophagus. This condition can make it difficult and painful to swallow. What are the causes? Most causes of esophagitis are not serious. Common causes of this condition include:  Gastroesophageal reflux disease (GERD). This is when stomach contents move back up into the esophagus (reflux).  Repeated vomiting.  An allergic-type reaction, especially caused by food allergies (eosinophilic esophagitis).  Injury to the esophagus by swallowing large pills with or without water, or swallowing certain types of medicines.  Swallowing (ingesting) harmful chemicals, such as household cleaning  products.  Heavy alcohol use.  An infection of the esophagus.This most often occurs in people who have a weakened immune system.  Radiation or chemotherapy treatment for cancer.  Certain diseases such as sarcoidosis, Crohn disease, and scleroderma.  What are the signs or symptoms? Symptoms of this condition include:  Difficult or painful swallowing.  Pain with swallowing acidic liquids, such as citrus juices.  Pain with burping.  Chest pain.  Difficulty breathing.  Nausea.  Vomiting.  Pain in the abdomen.  Weight loss.  Ulcers in the mouth.  Patches of white material in the mouth (candidiasis).  Fever.  Coughing up blood or vomiting blood.  Stool that is black, tarry, or bright red.  How is this diagnosed? Your health care provider will take a medical history and perform a physical exam. You may also have other tests, including:  An endoscopy to examine your stomach and esophagus with a small camera.  A test that measures the acidity level in your esophagus.  A test that measures how much pressure is on your esophagus.  A barium swallow or modified barium swallow to show the shape, size, and functioning of your esophagus.  Allergy tests.  How is this treated? Treatment for this condition depends on the cause of your esophagitis. In some cases, steroids or other medicines may be given to help relieve your symptoms or to treat the underlying cause of your condition. You may have to make some lifestyle changes, such as:  Avoiding alcohol.  Quitting smoking.  Changing your diet.  Exercising.  Changing your sleep habits and your sleep environment.  Follow these instructions at home:  Take these actions to decrease your discomfort and to help avoid complications. Diet  Follow a diet as recommended by your health care provider. This may involve avoiding foods and drinks such as: ? Coffee and tea (with or without caffeine). ? Drinks that contain  alcohol. ? Energy drinks and sports drinks. ? Carbonated drinks or sodas. ? Chocolate and cocoa. ? Peppermint and mint flavorings. ? Garlic and onions. ? Horseradish. ? Spicy and acidic foods, including peppers, chili powder, curry powder, vinegar, hot sauces, and barbecue sauce. ? Citrus fruit juices and citrus fruits, such as oranges, lemons, and limes. ? Tomato-based foods, such as red sauce, chili, salsa, and pizza with red sauce. ? Fried and fatty foods, such as donuts, french fries, potato chips, and high-fat dressings. ? High-fat meats, such as hot dogs and fatty cuts of red and white meats, such as rib eye steak, sausage, ham, and bacon. ? High-fat dairy items, such as whole milk, butter, and cream cheese.  Eat small, frequent meals instead of large meals.  Avoid drinking large amounts of liquid with your meals.  Avoid eating meals during the 2-3 hours before bedtime.  Avoid lying down right after you eat.  Do not exercise right after you eat.  Avoid foods and drinks that seem to make your symptoms worse. General instructions  Pay attention to any changes in your symptoms.  Take over-the-counter and prescription medicines only as told by your health care provider. Do not take aspirin, ibuprofen, or other NSAIDs unless your health care provider told you to do so.  If you have trouble taking pills, use a pill splitter to decrease the size of the pill. This will decrease the chance of the pill getting stuck or injuring your esophagus on the way down. Also, drink water after you take a pill.  Do not use any tobacco products, including cigarettes, chewing tobacco, and e-cigarettes. If you need help quitting, ask your health care provider.  Wear loose-fitting clothing. Do not wear anything tight around your waist that causes pressure on your abdomen.  Raise (elevate) the head of your bed about 6 inches (15 cm).  Try to reduce your stress, such as with yoga or meditation. If  you need help reducing stress, ask your health care provider.  If you are overweight, reduce your weight to an amount that is healthy for you. Ask your health care provider for guidance about a safe weight loss goal.  Keep all follow-up visits as told by your health care provider. This is important. Contact a health care provider if:  You have new symptoms.  You have unexplained weight loss.  You have difficulty swallowing, or it hurts to swallow.  You have wheezing or a persistent cough.  Your symptoms do not improve with treatment.  You have frequent heartburn for more than two weeks. Get help right away if:  You have severe pain in your arms, neck, jaw, teeth, or back.  You feel sweaty, dizzy, or light-headed.  You have chest pain or shortness of breath.  You vomit and your vomit looks like blood or coffee grounds.  Your stool is bloody or black.  You have a fever.  You cannot swallow, drink, or eat. This information is not intended to replace advice given to you by your health care provider. Make sure you discuss any questions you have with your health care provider. Document Released: 11/24/2004 Document Revised: 03/24/2016 Document Reviewed: 02/11/2015 Elsevier Interactive Patient Education  2018 [employer].  Dizziness  Dizziness is a common problem. It is a feeling of unsteadiness or light-headedness. You may feel like you are about to faint. Dizziness can lead to injury if you stumble or fall. Anyone can become dizzy, but dizziness is more common in older adults. This condition can be caused by a number of things, including medicines, dehydration, or illness. Follow these instructions at home: Taking these steps may help with your condition: Eating and drinking  Drink enough fluid to keep your urine clear or pale yellow. This helps to keep you from becoming dehydrated. Try to drink more clear fluids, such as water.  Do not drink alcohol.  Limit your caffeine  intake if directed by your health care provider.  Limit your salt intake if directed by your health care provider. Activity  Avoid making quick movements. ? Rise slowly from chairs and steady yourself until you feel okay. ? In the morning, first sit up on the side of the bed. When you feel okay, stand slowly while you hold onto something until you know that your balance is fine.  Move your legs often if you need to stand in one place for a long time. Tighten and relax your muscles in your legs while you are standing.  Do not drive or operate heavy machinery if you feel dizzy.  Avoid bending down if you feel dizzy. Place items in your home so that they are easy for you to reach without leaning over. Lifestyle  Do not use any tobacco products, including cigarettes, chewing tobacco, or electronic cigarettes. If you need help quitting, ask your health care provider.  Try to reduce your stress level, such as with yoga or meditation. Talk with your health care provider if you need help. General instructions  Watch your dizziness for any changes.  Take medicines only as directed by your health care provider. Talk with your health care provider if you think that your dizziness is caused by a medicine that you are taking.  Tell a friend or a family member that you are feeling dizzy. If he or she notices any changes in your behavior, have this person call your health care provider.  Keep all follow-up visits as directed by your health care provider. This is important. Contact a health care provider if:  Your dizziness does not go away.  Your dizziness or light-headedness gets worse.  You feel nauseous.  You have reduced hearing.  You have new symptoms.  You are unsteady on your feet or you feel like the room is spinning. Get help right away if:  You vomit or have diarrhea and are unable to eat or drink anything.  You have problems talking, walking, swallowing, or using your arms,  hands, or legs.  You feel generally weak.  You are not thinking clearly or you have trouble forming sentences. It may take a friend or family member to notice this.  You have chest pain, abdominal pain, shortness of breath, or sweating.  Your vision changes.  You notice any bleeding.  You have a headache.  You have neck pain or a stiff neck.  You have a fever. This information is not intended to replace advice given to you by your health care provider. Make sure you discuss any questions you have with your health care provider. Document Released: 04/12/2001 Document Revised: 03/24/2016 Document Reviewed: 10/13/2014 Elsevier Interactive Patient Education  2017 [employer].      IF you received an x-ray today, you will receive an invoice from [facility]. Please contact [facility] at [phone] with questions or concerns regarding your invoice.   IF you received labwork today, you will receive an invoice from [facility]. Please contact [facility] at [phone] with questions or concerns regarding your invoice.   Our billing staff will not be able to assist you with questions regarding bills from these companies.  You will be contacted with the lab results as soon as they are available. The fastest way to get your results is to activate your My Chart account. Instructions are located on the last page of this paperwork. If you have not heard from [place] regarding the results in 2 weeks, please contact this office.

## 2017-08-07 NOTE — Progress Notes (Signed)
Subjective:  By signing my name below, I, Shane Sawyer, attest that this documentation has been prepared under the direction and in the presence of Merri Ray, MD. Electronically Signed: Moises Sawyer, [place]. 08/07/2017 , 9:23 AM .  Patient was seen in Room 11 .   Patient ID: Shane Sawyer, male    DOB: 08/16/1956, 61 y.o.   MRN: [id number] Chief Complaint  Patient presents with  . Follow-up    pneumonia, ER follow up from 08/03/17   HPI Shane Sawyer is a 61 y.o. male  Here for follow up of cough and left sided pneumonia. See prior visits. He also has a history of chest discomfort.   He has history of lightheadedness, occurs episodically. See previous notes. EKG without acute findings previously. He states his BP drops low to around 98/58. He mentions having these symptoms after metoprolol.   Lab Results  Component Value Date   WBC 8.7 07/04/2017   HGB 12.5 (A) 07/04/2017   HCT 38.0 (A) 07/04/2017   MCV 82.1 07/04/2017   PLT 205 12/09/2016    He had chest xray at last visit 1 week ago with resolution of prior abnormalities, thought viral infection and component of heartburn. Refer to cardiology to discuss chest pain further. He had EKG without acute findings and negative D-dimer. Tessalon was prescribed last visit for cough, continued on omeprazole during the day and added zantac at night for heartburn.   It appears he was seen at [facility] urgent care on Oct 4th with chest pain, leg weakness, tingling sensation in his hands and feet bilaterally, as well as both sides of his face including tongue. He was sent to the ER. He had normal EKG, normal troponin including repeat troponin, reassuring CBC, and CMP. Symptoms improved with GI cocktail. D-dimer was normal. His sodium was 132, but normal electrolytes otherwise on Oct 4th. He had normal TSH on Aug 30th and normal B-12 on Sept 4th. He was treated with carafate, possible esophagitis secondary to doxycycline.   He reports having  anxiety when he felt the numbness and tingling sensation into his wrists and his feet; no weakness in his arms. He had numbness in the past, but only localized in his finger tips. His chest pain has improved. Most of his numbness resolved the next day, but still notes having some numbness and tingling in his finger tips. He has an appointment with neurology in Nov. He has to call cardiology back to set up an appointment. He denies seeing a GI in the past. He was still taking omeprazole and zantac as needed at night.   He also complains having neck pain and discomfort, as well into his shoulders (right worse than left). He's seen orthopedics in the past with injections into his neck. He denies history of surgery done. He hasn't had recent imaging of his neck done.   Patient Active Problem List   Diagnosis Date Noted  . Rectal bleeding 09/21/2015  . Change in bowel habits 09/21/2015  . Special screening for malignant neoplasms, colon 09/21/2015  . Tremor 09/10/2013  . Unspecified essential hypertension 08/29/2013   Past Medical History:  Diagnosis Date  . Cancer ([place])    skin, left temple  . Erectile dysfunction   . HBP (high Sawyer pressure)   . History of echocardiogram    Echo 3/18: EF 55-60, no RWMA, PASP 27  . History of nuclear stress test    Myoview 3/18: EF 50, inf thinning, no ischemia, normal wall motion; Low  Risk   Past Surgical History:  Procedure Laterality Date  . NOSE SURGERY     times 2  . SKIN CANCER EXCISION  12/2016   Allergies  Allergen Reactions  . Ivp Dye [Iodinated Diagnostic Agents]     Swelling in throat, SOB  . Penicillins   . Sulfur    Prior to Admission medications   Medication Sig Start Date End Date Taking? Authorizing Provider  AMBULATORY NON FORMULARY MEDICATION Emergency B complex Takes one tablet daily   Yes [provider]  AMBULATORY NON FORMULARY MEDICATION L-arginine Take one tablet daily   Yes [provider]  APPLE CIDER  VINEGAR PO Take 1 Dose by mouth daily.   Yes [provider]  COD LIVER OIL PO Take 1 Dose by mouth daily.   Yes [provider]  Coenzyme Q10 (CO Q-10 PO) Take 1 tablet by mouth daily.   Yes [provider]  FOLIC ACID PO Take 1 tablet by mouth daily.   Yes [provider]  GARLIC PO Take 1 tablet by mouth daily.   Yes [provider]  losartan-hydrochlorothiazide (HYZAAR) 100-25 MG tablet Take 1 tablet by mouth daily. 06/29/17  Yes Shane Agreste, MD  metoprolol succinate (TOPROL-XL) 50 MG 24 hr tablet Take 1 tablet (50 mg total) by mouth daily. Take with or immediately following a meal. 06/29/17  Yes Shane Agreste, MD  MILK THISTLE PO Take 1 tablet by mouth daily.   Yes [provider]  Niacin (VITAMIN B-3 PO) Take 1 tablet by mouth daily. Reported on 05/10/2016   Yes [provider]  Omega-3 Fatty Acids (FISH OIL PO) Take 1 tablet by mouth daily.   Yes [provider]  omeprazole (PRILOSEC) 20 MG capsule Take 20 mg by mouth daily.   Yes [provider]  Plant Sterols and Stanols (CHOLEST OFF PO) Take 1 tablet by mouth daily.   Yes [provider]  PROAIR HFA 108 (90 Base) MCG/ACT inhaler INHALE 1 PUFF  INTO THE LUNGS Q 4 TO 6 H PRN 07/22/17  Yes [provider]  tadalafil (CIALIS) 20 MG tablet Take 20 mg by mouth daily as needed for erectile dysfunction.   Yes [provider]  thiamine (VITAMIN B-1) 100 MG tablet Take 100 mg by mouth daily.   Yes [provider]   Social History   Social History  . Marital status: Married    Spouse name: N/A  . Number of children: 1  . Years of education: 12+   Occupational History  . Not on file.   Social History Main Topics  . Smoking status: Never Smoker  . Smokeless tobacco: Never Used  . Alcohol use 1.2 oz/week    2 Cans of beer per week     Comment: weekly   . Drug use: No  . Sexual activity: Not on file   Other Topics  Concern  . Not on file   Social History Narrative   Patient lives at home girlfriend [name].   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   Review of Systems  Constitutional: Negative for fatigue and unexpected weight change.  Eyes: Negative for visual disturbance.  Respiratory: Negative for cough, chest tightness and shortness of breath.   Cardiovascular: Negative for chest pain, palpitations and leg swelling.  Gastrointestinal: Negative for abdominal  pain and Sawyer in stool.  Musculoskeletal: Positive for arthralgias and neck pain.  Neurological: Positive for numbness. Negative for dizziness, weakness, light-headedness and headaches.       Objective:   Physical Exam  Constitutional: He is oriented to person, place, and time. He appears well-developed and well-nourished. No distress.  HENT:  Head: Normocephalic and atraumatic.  Mouth/Throat: Oropharynx is clear and moist.  Eyes: Pupils are equal, round, and reactive to light. EOM are normal.  Neck: Neck supple.  Cardiovascular: Normal rate.   Pulmonary/Chest: Effort normal. No respiratory distress.  Musculoskeletal: Normal range of motion.  C-spine: Decreased extension, 45 degrees rotation bilaterally, slight decreased lateral flexion, slight discomfort with lateral flexion; slight tenderness of lower cervical spine midline Right shoulder: decreased internal rotation, abduction limited due to pain, pain with empty can testing  Neurological: He is alert and oriented to person, place, and time. He has normal strength. Coordination and gait normal.  Reflex Scores:      Tricep reflexes are 2+ on the right side and 2+ on the left side.      Bicep reflexes are 1+ on the right side and 1+ on the left side.      Brachioradialis reflexes are 1+ on the right side and 1+ on the left side. No focal weakness, no  facial droop, no pronator drift, normal finger-to-nose, normal heel-to-toe; equal grip strength intact  Skin: Skin is warm and dry.  Psychiatric: He has a normal mood and affect. His behavior is normal.  Nursing note and vitals reviewed.   Vitals:   08/07/17 0832  BP: 102/74  Pulse: 72  Resp: 16  SpO2: 98%  Weight: 243 lb 12.8 oz (110.6 kg)  Height: 6' 0.02" (1.829 m)   Dg Cervical Spine Complete  Result Date: 08/07/2017 CLINICAL DATA:  Neck and shoulder pain. The patient experienced a recent episode of hand and foot numbness. No known injury. EXAM: CERVICAL SPINE - COMPLETE 4+ VIEW COMPARISON:  None. FINDINGS: There is straightening of the normal cervical lordosis. Vertebral body height and alignment are maintained. No focal lesion is identified. Intervertebral disc space height is maintained with mild endplate spurring seen at C4-5, C5-6 and C6-7. The facet joints are unremarkable. Prevertebral soft tissues appear normal. Lung apices are clear. Carotid atherosclerosis is noted. IMPRESSION: Very mild appearing cervical degenerative change. Carotid atherosclerosis. Electronically Signed   By: Inge Rise M.D.   On: 08/07/2017 09:45       Assessment & Plan:  Shane Sawyer is a 61 y.o. male Chest discomfort Acute esophagitis - Plan: Ambulatory referral to Gastroenterology Gastroesophageal reflux disease, esophagitis presence not specified  - prior suspected PNA treated initially with azithromycin, then treated elsewhere with doxycyline. Now with possible esophagitis, underlying GERD. Some improvement with Carafate.  - Continue proton pump inhibitor with omeprazole each day, as well as Carafate.    -Will refer to gastroenterology to determine if EGD needed. RTC precautions if worsening symptoms are not continuing to improve with Carafate and PPI.   -Recommended continue follow-up with cardiology with chest symptoms and episodic dyspnea, but evaluation in the emergency room was  reassuring. ER precautions if acute worsening.  Pain in joint of right shoulder - Plan: AMB referral to orthopedics Neck pain - Plan: DG Cervical Spine Complete, AMB referral to orthopedics  -Mild cervical spine disease, but also with some right shoulder discomfort that may be rotator cuff tendinosis. Could be combined cervical and shoulder symptoms. If her to orthopedics for further evaluation. Hold  on NSAIDs for now given reflux symptoms and possible esophagitis as above.  - Tylenol, heat or ice as needed and range of motion for now  Lightheadedness - Plan: CBC  -Check CBC, overall reassuring evaluation in the emergency room recently.   -B he is on the lower side, advised to decrease his Toprol to one half dose. Monitor home readings, orthostatic precautions   -Previous hand, feet, face numbness and tingling, emergency room evaluation noted. Differential includes panic attack, but denies significant anxiety symptoms currently and symptoms are improving. Nonfocal neuro exam.  - RTC precautions if worsening, or ER if needed. He does have planned follow-up with cardiology and neurology.  Hyponatremia - Plan: Basic metabolic panel  -Borderline, will repeat sodium on BMP.  No orders of the defined types were placed in this encounter.  Patient Instructions    Keep follow up with cardiology as planned for breathing and chest symptoms, and I will also refer you to gastroenterology for possible esophagitis. Continue omeprazole once per day, and carafate for now.  Return to the clinic or go to the nearest emergency room if any of your symptoms worsen or new symptoms occur.  For hand, feet, face numbness/tingling - I'm glad to hear that those symptoms are improving. Keep follow up with neurology and cardiology as planned. If those symptoms worsen, return here or the emergency room.   For lightheadedness, you can try decreasing your metoprolol to one half pill each day. Recheck in the next week to 10  days if those symptoms have not improved. If acute worsening, proceed to the emergency room.   I will refer you to orthopedics for the neck pain, right shoulder pain. With current possible esophagus issues, Tylenol may be the safest medication for now. He can also use heat or ice to the your neck with gentle range of motion and stretching throughout the day.  Return to the clinic or go to the nearest emergency room if any of your symptoms worsen or new symptoms occur.    Esophagitis Esophagitis is inflammation of the esophagus. The esophagus is the tube that carries food and liquids from your mouth to your stomach. Esophagitis can cause soreness or pain in the esophagus. This condition can make it difficult and painful to swallow. What are the causes? Most causes of esophagitis are not serious. Common causes of this condition include:  Gastroesophageal reflux disease (GERD). This is when stomach contents move back up into the esophagus (reflux).  Repeated vomiting.  An allergic-type reaction, especially caused by food allergies (eosinophilic esophagitis).  Injury to the esophagus by swallowing large pills with or without water, or swallowing certain types of medicines.  Swallowing (ingesting) harmful chemicals, such as household cleaning products.  Heavy alcohol use.  An infection of the esophagus.This most often occurs in people who have a weakened immune system.  Radiation or chemotherapy treatment for cancer.  Certain diseases such as sarcoidosis, Crohn disease, and scleroderma.  What are the signs or symptoms? Symptoms of this condition include:  Difficult or painful swallowing.  Pain with swallowing acidic liquids, such as citrus juices.  Pain with burping.  Chest pain.  Difficulty breathing.  Nausea.  Vomiting.  Pain in the abdomen.  Weight loss.  Ulcers in the mouth.  Patches of white material in the mouth (candidiasis).  Fever.  Coughing up Sawyer or  vomiting Sawyer.  Stool that is black, tarry, or bright red.  How is this diagnosed? Your health care provider will take a medical  history and perform a physical exam. You may also have other tests, including:  An endoscopy to examine your stomach and esophagus with a small camera.  A test that measures the acidity level in your esophagus.  A test that measures how much pressure is on your esophagus.  A barium swallow or modified barium swallow to show the shape, size, and functioning of your esophagus.  Allergy tests.  How is this treated? Treatment for this condition depends on the cause of your esophagitis. In some cases, steroids or other medicines may be given to help relieve your symptoms or to treat the underlying cause of your condition. You may have to make some lifestyle changes, such as:  Avoiding alcohol.  Quitting smoking.  Changing your diet.  Exercising.  Changing your sleep habits and your sleep environment.  Follow these instructions at home: Take these actions to decrease your discomfort and to help avoid complications. Diet  Follow a diet as recommended by your health care provider. This may involve avoiding foods and drinks such as: ? Coffee and tea (with or without caffeine). ? Drinks that contain alcohol. ? Energy drinks and sports drinks. ? Carbonated drinks or sodas. ? Chocolate and cocoa. ? Peppermint and mint flavorings. ? Garlic and onions. ? Horseradish. ? Spicy and acidic foods, including peppers, chili powder, curry powder, vinegar, hot sauces, and barbecue sauce. ? Citrus fruit juices and citrus fruits, such as oranges, lemons, and limes. ? Tomato-based foods, such as red sauce, chili, salsa, and pizza with red sauce. ? Fried and fatty foods, such as donuts, french fries, potato chips, and high-fat dressings. ? High-fat meats, such as hot dogs and fatty cuts of red and white meats, such as rib eye steak, sausage, ham, and bacon. ? High-fat  dairy items, such as whole milk, butter, and cream cheese.  Eat small, frequent meals instead of large meals.  Avoid drinking large amounts of liquid with your meals.  Avoid eating meals during the 2-3 hours before bedtime.  Avoid lying down right after you eat.  Do not exercise right after you eat.  Avoid foods and drinks that seem to make your symptoms worse. General instructions  Pay attention to any changes in your symptoms.  Take over-the-counter and prescription medicines only as told by your health care provider. Do not take aspirin, ibuprofen, or other NSAIDs unless your health care provider told you to do so.  If you have trouble taking pills, use a pill splitter to decrease the size of the pill. This will decrease the chance of the pill getting stuck or injuring your esophagus on the way down. Also, drink water after you take a pill.  Do not use any tobacco products, including cigarettes, chewing tobacco, and e-cigarettes. If you need help quitting, ask your health care provider.  Wear loose-fitting clothing. Do not wear anything tight around your waist that causes pressure on your abdomen.  Raise (elevate) the head of your bed about 6 inches (15 cm).  Try to reduce your stress, such as with yoga or meditation. If you need help reducing stress, ask your health care provider.  If you are overweight, reduce your weight to an amount that is healthy for you. Ask your health care provider for guidance about a safe weight loss goal.  Keep all follow-up visits as told by your health care provider. This is important. Contact a health care provider if:  You have new symptoms.  You have unexplained weight loss.  You have  difficulty swallowing, or it hurts to swallow.  You have wheezing or a persistent cough.  Your symptoms do not improve with treatment.  You have frequent heartburn for more than two weeks. Get help right away if:  You have severe pain in your arms,  neck, jaw, teeth, or back.  You feel sweaty, dizzy, or light-headed.  You have chest pain or shortness of breath.  You vomit and your vomit looks like Sawyer or coffee grounds.  Your stool is bloody or black.  You have a fever.  You cannot swallow, drink, or eat. This information is not intended to replace advice given to you by your health care provider. Make sure you discuss any questions you have with your health care provider. Document Released: 11/24/2004 Document Revised: 03/24/2016 Document Reviewed: 02/11/2015 Elsevier Interactive Patient Education  2018 [employer].  Dizziness Dizziness is a common problem. It is a feeling of unsteadiness or light-headedness. You may feel like you are about to faint. Dizziness can lead to injury if you stumble or fall. Anyone can become dizzy, but dizziness is more common in older adults. This condition can be caused by a number of things, including medicines, dehydration, or illness. Follow these instructions at home: Taking these steps may help with your condition: Eating and drinking  Drink enough fluid to keep your urine clear or pale yellow. This helps to keep you from becoming dehydrated. Try to drink more clear fluids, such as water.  Do not drink alcohol.  Limit your caffeine intake if directed by your health care provider.  Limit your salt intake if directed by your health care provider. Activity  Avoid making quick movements. ? Rise slowly from chairs and steady yourself until you feel okay. ? In the morning, first sit up on the side of the bed. When you feel okay, stand slowly while you hold onto something until you know that your balance is fine.  Move your legs often if you need to stand in one place for a long time. Tighten and relax your muscles in your legs while you are standing.  Do not drive or operate heavy machinery if you feel dizzy.  Avoid bending down if you feel dizzy. Place items in your home so that they  are easy for you to reach without leaning over. Lifestyle  Do not use any tobacco products, including cigarettes, chewing tobacco, or electronic cigarettes. If you need help quitting, ask your health care provider.  Try to reduce your stress level, such as with yoga or meditation. Talk with your health care provider if you need help. General instructions  Watch your dizziness for any changes.  Take medicines only as directed by your health care provider. Talk with your health care provider if you think that your dizziness is caused by a medicine that you are taking.  Tell a friend or a family member that you are feeling dizzy. If he or she notices any changes in your behavior, have this person call your health care provider.  Keep all follow-up visits as directed by your health care provider. This is important. Contact a health care provider if:  Your dizziness does not go away.  Your dizziness or light-headedness gets worse.  You feel nauseous.  You have reduced hearing.  You have new symptoms.  You are unsteady on your feet or you feel like the room is spinning. Get help right away if:  You vomit or have diarrhea and are unable to eat or drink anything.  You have problems talking, walking, swallowing, or using your arms, hands, or legs.  You feel generally weak.  You are not thinking clearly or you have trouble forming sentences. It may take a friend or family member to notice this.  You have chest pain, abdominal pain, shortness of breath, or sweating.  Your vision changes.  You notice any bleeding.  You have a headache.  You have neck pain or a stiff neck.  You have a fever. This information is not intended to replace advice given to you by your health care provider. Make sure you discuss any questions you have with your health care provider. Document Released: 04/12/2001 Document Revised: 03/24/2016 Document Reviewed: 10/13/2014 Elsevier Interactive Patient  Education  2017 [employer].      IF you received an x-ray today, you will receive an invoice from [facility]. Please contact [facility] at [phone] with questions or concerns regarding your invoice.   IF you received labwork today, you will receive an invoice from [place]. Please contact [facility] at [phone] with questions or concerns regarding your invoice.   Our billing staff will not be able to assist you with questions regarding bills from these companies.  You will be contacted with the lab results as soon as they are available. The fastest way to get your results is to activate your My Chart account. Instructions are located on the last page of this paperwork. If you have not heard from [place] regarding the results in 2 weeks, please contact this office.       I personally performed the services described in this documentation, which was scribed in my presence. The recorded information has been reviewed and considered for accuracy and completeness, addended by me as needed, and agree with information above.  Signed,   Merri Ray, MD [facility].  08/09/17 10:16 PM

## 2017-08-08 ENCOUNTER — Telehealth: Payer: Self-pay | Admitting: Family Medicine

## 2017-08-08 LAB — BASIC METABOLIC PANEL
BUN/Creatinine Ratio: 14 (ref 10–24)
BUN: 14 mg/dL (ref 8–27)
CO2: 26 mmol/L (ref 20–29)
CREATININE: 0.97 mg/dL (ref 0.76–1.27)
Calcium: 9 mg/dL (ref 8.6–10.2)
Chloride: 99 mmol/L (ref 96–106)
GFR calc Af Amer: 97 mL/min/{1.73_m2} (ref >59)
GFR calc non Af Amer: 84 mL/min/{1.73_m2} (ref >59)
GLUCOSE: 101 mg/dL — AB (ref 65–99)
Potassium: 3.7 mmol/L (ref 3.5–5.2)
SODIUM: 137 mmol/L (ref 134–144)

## 2017-08-08 LAB — CBC
HEMOGLOBIN: 12.4 g/dL — AB (ref 13.0–17.7)
Hematocrit: 37.5 % (ref 37.5–51.0)
MCH: 27.4 pg (ref 26.6–33.0)
MCHC: 33.1 g/dL (ref 31.5–35.7)
MCV: 83 fL (ref 79–97)
Platelets: 234 10*3/uL (ref 150–379)
RBC: 4.53 x10E6/uL (ref 4.14–5.80)
RDW: 16 % — ABNORMAL HIGH (ref 12.3–15.4)
WBC: 6.5 10*3/uL (ref 3.4–10.8)

## 2017-08-08 NOTE — Telephone Encounter (Signed)
Beverly from Dr. Irven Shelling office called letting us know that the pt would have to take care of some business at their office before they can schedule him and they made pt aware of this and he stated he would call them back to get it handled to schedule. Rise Paganini can be reached at [phone]. Thanks.

## 2017-08-24 ENCOUNTER — Encounter: Payer: Self-pay | Admitting: Radiology

## 2017-08-25 ENCOUNTER — Other Ambulatory Visit: Payer: Self-pay | Admitting: Physician Assistant

## 2017-08-25 DIAGNOSIS — R1310 Dysphagia, unspecified: Secondary | ICD-10-CM

## 2017-08-25 DIAGNOSIS — K219 Gastro-esophageal reflux disease without esophagitis: Secondary | ICD-10-CM

## 2017-09-04 ENCOUNTER — Other Ambulatory Visit: Payer: BLUE CROSS/BLUE SHIELD

## 2017-09-07 ENCOUNTER — Institutional Professional Consult (permissible substitution): Payer: BLUE CROSS/BLUE SHIELD | Admitting: Neurology

## 2017-09-25 ENCOUNTER — Ambulatory Visit: Payer: BLUE CROSS/BLUE SHIELD | Admitting: Neurology

## 2017-09-25 ENCOUNTER — Encounter: Payer: Self-pay | Admitting: Neurology

## 2017-09-25 DIAGNOSIS — R202 Paresthesia of skin: Secondary | ICD-10-CM | POA: Diagnosis not present

## 2017-09-25 NOTE — Progress Notes (Signed)
PATIENT: Shane Sawyer DOB: 12/27/1955  Chief Complaint  Patient presents with  . New Patient (Initial Visit)    Persistent numbness and tingling in fingers and toes. Dr. Carlota Raspberry is PCP and Referring. Pt is also complaining of nerve pain to his right foot. Vision: 20/30 without correction.     HISTORICAL  Shane Sawyer is a 61 year old male, seen in refer by his primary care physician Dr. Merri Ray for evaluation of persistent numbness and tingling in fingers and toes, initial evaluation was on September 25, 2017.  He has past medical history of hypertension, takes frequent road trip, in September 2018, while traveling out of town, lying at hotel, he had sudden onset bilateral feet numbness, also noticed numbness tingling involving bilateral hands from wrist down, had transient tongue and face numbness lasting for 1-2 days, worrying about the stroke, he presented to local hospital in [place], per patient, laboratory evaluation and cardiac evaluation was normal, there was no neurological evaluation performed.  His face and tongue paresthesia on the last for couple days, now back to baseline, he denies weakness, no dysarthria, no dysphagia, but he has still has intermittent bilateral toes, and hands paresthesia, he denies weakness, no gait abnormality, no bowel bladder incontinence.  REVIEW OF SYSTEMS: Full 14 system review of systems performed and notable only for numbness, dizziness, cough, snoring, fatigue  ALLERGIES: Allergies  Allergen Reactions  . Ivp Dye [Iodinated Diagnostic Agents]     Swelling in throat, SOB  . Penicillins   . Sulfur     HOME MEDICATIONS: Current Outpatient Medications  Medication Sig Dispense Refill  . AMBULATORY NON FORMULARY MEDICATION Emergency B complex Takes one tablet daily    . AMBULATORY NON FORMULARY MEDICATION L-arginine Take one tablet daily    . APPLE CIDER VINEGAR PO Take 1 Dose by mouth daily.    . COD LIVER OIL PO Take 1 Dose by  mouth daily.    . Coenzyme Q10 (CO Q-10 PO) Take 1 tablet by mouth daily.    Marland Kitchen FOLIC ACID PO Take 1 tablet by mouth daily.    Marland Kitchen GARLIC PO Take 1 tablet by mouth daily.    Marland Kitchen losartan-hydrochlorothiazide (HYZAAR) 100-25 MG tablet Take 1 tablet by mouth daily. 90 tablet 1  . metoprolol succinate (TOPROL-XL) 50 MG 24 hr tablet Take 1 tablet (50 mg total) by mouth daily. Take with or immediately following a meal. 90 tablet 1  . MILK THISTLE PO Take 1 tablet by mouth daily.    . Niacin (VITAMIN B-3 PO) Take 1 tablet by mouth daily. Reported on 05/10/2016    . Omega-3 Fatty Acids (FISH OIL PO) Take 1 tablet by mouth daily.    Marland Kitchen omeprazole (PRILOSEC) 20 MG capsule Take 20 mg by mouth daily.    . Plant Sterols and Stanols (CHOLEST OFF PO) Take 1 tablet by mouth daily.    Marland Kitchen PROAIR HFA 108 (90 Base) MCG/ACT inhaler INHALE 1 PUFF  INTO THE LUNGS Q 4 TO 6 H PRN  0  . tadalafil (CIALIS) 20 MG tablet Take 20 mg by mouth daily as needed for erectile dysfunction.    . thiamine (VITAMIN B-1) 100 MG tablet Take 100 mg by mouth daily.     No current facility-administered medications for this visit.     PAST MEDICAL HISTORY: Past Medical History:  Diagnosis Date  . Cancer ([place])    skin, left temple  . Erectile dysfunction   . HBP (high blood pressure)   .  History of echocardiogram    Echo 3/18: EF 55-60, no RWMA, PASP 27  . History of nuclear stress test    Myoview 3/18: EF 50, inf thinning, no ischemia, normal wall motion; Low Risk    PAST SURGICAL HISTORY: Past Surgical History:  Procedure Laterality Date  . NOSE SURGERY     times 2  . SKIN CANCER EXCISION  12/2016    FAMILY HISTORY: Family History  Problem Relation Age of Onset  . Bladder Cancer Father   . Heart failure Mother   . Valvular heart disease Mother   . Colon cancer Neg Hx   . Heart attack Neg Hx     SOCIAL HISTORY:  Social History   Socioeconomic History  . Marital status: Married    Spouse name: Not on file  . Number  of children: 1  . Years of education: 12+  . Highest education level: Not on file  Social Needs  . Financial resource strain: Not on file  . Food insecurity - worry: Not on file  . Food insecurity - inability: Not on file  . Transportation needs - medical: Not on file  . Transportation needs - non-medical: Not on file  Occupational History  . Not on file  Tobacco Use  . Smoking status: Never Smoker  . Smokeless tobacco: Never Used  Substance and Sexual Activity  . Alcohol use: Yes    Alcohol/week: 1.2 oz    Types: 2 Cans of beer per week    Comment: weekly   . Drug use: No  . Sexual activity: Not on file  Other Topics Concern  . Not on file  Social History Narrative   Patient lives at home girlfriend [name].   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     PHYSICAL EXAM   Vitals:   09/25/17 0722  BP: 113/68  Pulse: 70  Weight: 250 lb (113.4 kg)  Height: 6' 1.5" (1.867 m)    Not recorded      Body mass index is 32.54 kg/m.  PHYSICAL EXAMNIATION:  Gen: NAD, conversant, well nourised, obese, well groomed                     Cardiovascular: Regular rate rhythm, no peripheral edema, warm, nontender. Eyes: Conjunctivae clear without exudates or hemorrhage Neck: Supple, no carotid bruits. Pulmonary: Clear to auscultation bilaterally   NEUROLOGICAL EXAM:  MENTAL STATUS: Speech:    Speech is normal; fluent and spontaneous with normal comprehension.  Cognition:     Orientation to time, place and person     Normal recent and remote memory     Normal Attention span and concentration     Normal Language, naming, repeating,spontaneous speech     Fund of knowledge   CRANIAL NERVES: CN II: Visual fields are full to confrontation. Fundoscopic exam is normal with sharp discs and no vascular changes. Pupils are round equal and briskly reactive  to light. CN III, IV, VI: extraocular movement are normal. No ptosis. CN V: Facial sensation is intact to pinprick in all 3 divisions bilaterally. Corneal responses are intact.  CN VII: Face is symmetric with normal eye closure and smile. CN VIII: Hearing is normal to rubbing fingers CN IX, X: Palate elevates symmetrically. Phonation is normal. CN XI: Head turning and shoulder shrug are intact CN XII: Tongue is  midline with normal movements and no atrophy.  MOTOR: There is no pronator drift of out-stretched arms. Muscle bulk and tone are normal. Muscle strength is normal.  REFLEXES: Reflexes are 2+ and symmetric at the biceps, triceps, knees, and ankles. Plantar responses are flexor.  SENSORY: Intact to light touch, pinprick, positional sensation and vibratory sensation are intact in fingers and toes.  COORDINATION: Rapid alternating movements and fine finger movements are intact. There is no dysmetria on finger-to-nose and heel-knee-shin.    GAIT/STANCE: Posture is normal. Gait is steady with normal steps, base, arm swing, and turning. Heel and toe walking are normal. Tandem gait is normal.  Romberg is absent.   DIAGNOSTIC DATA (LABS, IMAGING, TESTING) - I reviewed patient records, labs, notes, testing and imaging myself where available.   ASSESSMENT AND PLAN  Shane Sawyer is a 61 y.o. male   Acute onset paresthesia involving bilateral upper and lower extremity, and face,  Need to rule out central nervous system etiology, MRI of cervical spine,  Possibility of peripheral neuropathy, EMG nerve conduction study  Laboratory evaluations  Marcial Pacas, M.D. Ph.D.  [facility] [address], [place],  [address] Ph: [phone] Fax: [phone]  CC: [name], MD

## 2017-09-27 ENCOUNTER — Telehealth: Payer: Self-pay | Admitting: Neurology

## 2017-09-27 LAB — IMMUNOFIXATION ELECTROPHORESIS
IGG (IMMUNOGLOBIN G), SERUM: 1219 mg/dL (ref 700–1600)
IGM (IMMUNOGLOBULIN M), SRM: 108 mg/dL (ref 20–172)
IgA/Immunoglobulin A, Serum: 311 mg/dL (ref 61–437)
TOTAL PROTEIN: 6.9 g/dL (ref 6.0–8.5)

## 2017-09-27 LAB — SEDIMENTATION RATE: SED RATE: 10 mm/h (ref 0–30)

## 2017-09-27 LAB — HGB A1C W/O EAG: HEMOGLOBIN A1C: 5.9 % — AB (ref 4.8–5.6)

## 2017-09-27 LAB — ANA W/REFLEX IF POSITIVE: ANA: NEGATIVE

## 2017-09-27 LAB — C-REACTIVE PROTEIN: CRP: 3.7 mg/L (ref 0.0–4.9)

## 2017-09-27 LAB — RPR: RPR Ser Ql: NONREACTIVE

## 2017-09-27 LAB — CK: Total CK: 102 U/L (ref 24–204)

## 2017-09-27 NOTE — Telephone Encounter (Signed)
Spoke to patient he is aware of results.

## 2017-09-27 NOTE — Telephone Encounter (Signed)
Please call patient, extensive laboratory evaluation showed mild elevated A1c 5.9,He should have diet control, exercise,

## 2017-09-29 ENCOUNTER — Encounter: Payer: Self-pay | Admitting: Neurology

## 2017-10-17 ENCOUNTER — Ambulatory Visit: Payer: BLUE CROSS/BLUE SHIELD

## 2017-10-17 DIAGNOSIS — R202 Paresthesia of skin: Secondary | ICD-10-CM

## 2017-10-18 ENCOUNTER — Telehealth: Payer: Self-pay | Admitting: *Deleted

## 2017-10-18 NOTE — Telephone Encounter (Signed)
Spoke to patient he is aware of results.

## 2017-10-18 NOTE — Telephone Encounter (Signed)
-----   Message from Marcial Pacas, MD sent at 10/18/2017  7:55 AM EST ----- Please call pt for normal MRI brain.

## 2017-10-18 NOTE — Telephone Encounter (Signed)
-----   Message from Marcial Pacas, MD sent at 10/18/2017  7:56 AM EST ----- Please call pt for normal MRI brain.

## 2017-10-20 ENCOUNTER — Ambulatory Visit (INDEPENDENT_AMBULATORY_CARE_PROVIDER_SITE_OTHER): Payer: BLUE CROSS/BLUE SHIELD | Admitting: Neurology

## 2017-10-20 DIAGNOSIS — G629 Polyneuropathy, unspecified: Secondary | ICD-10-CM | POA: Insufficient documentation

## 2017-10-20 DIAGNOSIS — R202 Paresthesia of skin: Secondary | ICD-10-CM | POA: Diagnosis not present

## 2017-10-20 DIAGNOSIS — Z0289 Encounter for other administrative examinations: Secondary | ICD-10-CM

## 2017-10-20 NOTE — Progress Notes (Signed)
PATIENT: Shane Sawyer DOB: 06-Jul-1956  No chief complaint on file.    HISTORICAL  Shane Sawyer is a 61 year old male, seen in refer by his primary care physician Dr. Merri Ray for evaluation of persistent numbness and tingling in fingers and toes, initial evaluation was on September 25, 2017.  He has past medical history of hypertension, takes frequent road trip, in September 2018, while traveling out of town, lying at hotel, he had sudden onset bilateral feet numbness, also noticed numbness tingling involving bilateral hands from wrist down, had transient tongue and face numbness lasting for 1-2 days, worrying about the stroke, he presented to local hospital in [place], per patient, laboratory evaluation and cardiac evaluation was normal, there was no neurological evaluation performed.  His face and tongue paresthesia on the last for couple days, now back to baseline, he denies weakness, no dysarthria, no dysphagia, but he has still has intermittent bilateral toes, and hands paresthesia, he denies weakness, no gait abnormality, no bowel bladder incontinence.  UPDATE Oct 20 2017: Electrodiagnostic study today showed mild axonal sensorimotor polyneuropathy, there is no evidence of bilateral lumbosacral radiculopathy, right cervical radiculopathy, but there is evidence of moderate bilateral carpal tunnel syndromes,  He does report of mild to moderate alcohol drink,  MRI of the brain was normal  Extensive laboratory evaluation showed mild elevated A1c 5.9, hemoglobin of 12.4, otherwise normal CPK, ANA, RPR  REVIEW OF SYSTEMS: Full 14 system review of systems performed and notable only for numbness, dizziness, cough, snoring, fatigue  ALLERGIES: Allergies  Allergen Reactions  . Ivp Dye [Iodinated Diagnostic Agents]     Swelling in throat, SOB  . Penicillins   . Sulfur     HOME MEDICATIONS: Current Outpatient Medications  Medication Sig Dispense Refill  . AMBULATORY NON  FORMULARY MEDICATION Emergency B complex Takes one tablet daily    . AMBULATORY NON FORMULARY MEDICATION L-arginine Take one tablet daily    . APPLE CIDER VINEGAR PO Take 1 Dose by mouth daily.    . COD LIVER OIL PO Take 1 Dose by mouth daily.    . Coenzyme Q10 (CO Q-10 PO) Take 1 tablet by mouth daily.    Marland Kitchen FOLIC ACID PO Take 1 tablet by mouth daily.    Marland Kitchen GARLIC PO Take 1 tablet by mouth daily.    Marland Kitchen losartan-hydrochlorothiazide (HYZAAR) 100-25 MG tablet Take 1 tablet by mouth daily. 90 tablet 1  . metoprolol succinate (TOPROL-XL) 50 MG 24 hr tablet Take 1 tablet (50 mg total) by mouth daily. Take with or immediately following a meal. 90 tablet 1  . MILK THISTLE PO Take 1 tablet by mouth daily.    . Niacin (VITAMIN B-3 PO) Take 1 tablet by mouth daily. Reported on 05/10/2016    . Omega-3 Fatty Acids (FISH OIL PO) Take 1 tablet by mouth daily.    Marland Kitchen omeprazole (PRILOSEC) 20 MG capsule Take 20 mg by mouth daily.    . Plant Sterols and Stanols (CHOLEST OFF PO) Take 1 tablet by mouth daily.    Marland Kitchen PROAIR HFA 108 (90 Base) MCG/ACT inhaler INHALE 1 PUFF  INTO THE LUNGS Q 4 TO 6 H PRN  0  . tadalafil (CIALIS) 20 MG tablet Take 20 mg by mouth daily as needed for erectile dysfunction.    . thiamine (VITAMIN B-1) 100 MG tablet Take 100 mg by mouth daily.     No current facility-administered medications for this visit.     PAST MEDICAL HISTORY: Past  Medical History:  Diagnosis Date  . Cancer ([place])    skin, left temple  . Erectile dysfunction   . HBP (high blood pressure)   . History of echocardiogram    Echo 3/18: EF 55-60, no RWMA, PASP 27  . History of nuclear stress test    Myoview 3/18: EF 50, inf thinning, no ischemia, normal wall motion; Low Risk    PAST SURGICAL HISTORY: Past Surgical History:  Procedure Laterality Date  . NOSE SURGERY     times 2  . SKIN CANCER EXCISION  12/2016    FAMILY HISTORY: Family History  Problem Relation Age of Onset  . Bladder Cancer Father   .  Heart failure Mother   . Valvular heart disease Mother   . Colon cancer Neg Hx   . Heart attack Neg Hx     SOCIAL HISTORY:  Social History   Socioeconomic History  . Marital status: Married    Spouse name: Not on file  . Number of children: 1  . Years of education: 12+  . Highest education level: Not on file  Social Needs  . Financial resource strain: Not on file  . Food insecurity - worry: Not on file  . Food insecurity - inability: Not on file  . Transportation needs - medical: Not on file  . Transportation needs - non-medical: Not on file  Occupational History  . Not on file  Tobacco Use  . Smoking status: Never Smoker  . Smokeless tobacco: Never Used  Substance and Sexual Activity  . Alcohol use: Yes    Alcohol/week: 1.2 oz    Types: 2 Cans of beer per week    Comment: weekly   . Drug use: No  . Sexual activity: Not on file  Other Topics Concern  . Not on file  Social History Narrative   Patient lives at home girlfriend [name].   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     PHYSICAL EXAM   There were no vitals filed for this visit.  Not recorded      There is no height or weight on file to calculate BMI.  PHYSICAL EXAMNIATION:  Gen: NAD, conversant, well nourised, obese, well groomed                     Cardiovascular: Regular rate rhythm, no peripheral edema, warm, nontender. Eyes: Conjunctivae clear without exudates or hemorrhage Neck: Supple, no carotid bruits. Pulmonary: Clear to auscultation bilaterally   NEUROLOGICAL EXAM:  MENTAL STATUS: Speech:    Speech is normal; fluent and spontaneous with normal comprehension.  Cognition:     Orientation to time, place and person     Normal recent and remote memory     Normal Attention span and concentration     Normal Language, naming, repeating,spontaneous speech      Fund of knowledge   CRANIAL NERVES: CN II: Visual fields are full to confrontation. Fundoscopic exam is normal with sharp discs and no vascular changes. Pupils are round equal and briskly reactive to light. CN III, IV, VI: extraocular movement are normal. No ptosis. CN V: Facial sensation is intact to pinprick in all 3 divisions bilaterally. Corneal responses are intact.  CN VII: Face is symmetric with normal eye closure and smile. CN VIII: Hearing is normal to rubbing fingers CN IX, X: Palate elevates  symmetrically. Phonation is normal. CN XI: Head turning and shoulder shrug are intact CN XII: Tongue is midline with normal movements and no atrophy.  MOTOR: There is no pronator drift of out-stretched arms. Muscle bulk and tone are normal. Muscle strength is normal.  REFLEXES: Reflexes are 2+ and symmetric at the biceps, triceps, knees, and ankles. Plantar responses are flexor.  SENSORY: Intact to light touch, pinprick, mildly decreased vibratory sensation at the toes  COORDINATION: Rapid alternating movements and fine finger movements are intact. There is no dysmetria on finger-to-nose and heel-knee-shin.    GAIT/STANCE: Posture is normal. Gait is steady with normal steps, base, arm swing, and turning. Heel and toe walking are normal. Tandem gait is normal.  Romberg is absent.   DIAGNOSTIC DATA (LABS, IMAGING, TESTING) - I reviewed patient records, labs, notes, testing and imaging myself where available.   ASSESSMENT AND PLAN  Shane Sawyer is a 61 y.o. male   Intermittent bilateral upper and lower extremity paresthesia  Evidence of mild axonal sensorimotor polyneuropathy, moderate bilateral carpal tunnel syndromes  A1c was mildly elevated  I have suggested moderate exercise, diet control, risks explained,  Only return to clinic for new issues  Marcial Pacas, M.D. Ph.D.  [facility] [address], [place], [place] [address] Ph: [phone] Fax:  [phone]  CC: [name], MD

## 2017-10-20 NOTE — Addendum Note (Signed)
Addended by: Marcial Pacas on: 10/20/2017 09:55 AM   Modules accepted: Level of Service

## 2017-10-20 NOTE — Procedures (Signed)
Full Name: Shane Sawyer Gender: Male MRN #: [id number] Date of Birth: 09/13/1956    Visit Date: 10/20/2017 08:31 Age: 61 Years 3 Months Old Examining Physician: Marcial Pacas, MD  Referring Physician: Krista Blue, MD History: 61 year old male presented with intermittent bilateral upper lower extremity paresthesia  Summary of the tests:  Nerve conduction study: Bilateral sural sensory responses show severely decreased the snap amplitude.  Bilateral superficial peroneal sensory responses were absent.  Bilateral peroneal to EDB and tibial motor responses showed moderately decreased the C map amplitude, with mild slow conduction velocity.  Bilateral ulnar sensory and right ulnar motor responses were normal.  Bilateral median sensory responses showed mild to moderately prolonged peak latency, with mildly decreased snap amplitude, right worse than left.  Bilateral median motor responses showed mildly prolonged distal latency, left median motor responses showed moderately decreased the C map amplitude.  Electromyography: Selective needle examination was performed at bilateral lower extremity muscles, right upper extremity muscle, bilateral lumbar sacral paraspinal, right cervical paraspinal muscles.  There was no significant abnormality found   Conclusion: This is an abnormal study.  There is electrodiagnostic evidence of length dependent mild axonal sensorimotor polyneuropathy.  There is also evidence of moderate bilateral carpal tunnel syndromes.  There is no evidence of bilateral lumbosacral radiculopathy, or right cervical radiculopathy.    ------------------------------- Physician Name, M.D.  [facility] [place], [place] [address] Tel: [phone] Fax: [phone]        [facility]    Nerve / Sites Muscle Latency Ref. Amplitude Ref. Rel Amp Segments Distance Velocity Ref. Area    ms ms mV mV %  cm m/s m/s mVms  R Median - APB     Wrist APB 4.5 ?4.4 9.2  ?4.0 100 Wrist - APB 7   28.8     Upper arm APB 9.3  8.4  91.2 Upper arm - Wrist 24 50 ?49 27.7  L Median - APB     Wrist APB 4.4 ?4.4 2.8 ?4.0 100 Wrist - APB 7   10.8     Upper arm APB 9.2  2.8  101 Upper arm - Wrist 24 50 ?49 10.8  R Ulnar - ADM     Wrist ADM 2.6 ?3.3 9.1 ?6.0 100 Wrist - ADM 7   28.8     B.Elbow ADM 7.1  8.1  89.7 B.Elbow - Wrist 23 51 ?49 28.1     A.Elbow ADM 9.5  8.0  98.7 A.Elbow - B.Elbow 12 50 ?49 28.1         A.Elbow - Wrist      R Peroneal - EDB     Ankle EDB 5.2 ?6.5 0.8 ?2.0 100 Ankle - EDB 9   2.4     Fib head EDB 13.6  0.5  61.9 Fib head - Ankle 33 39 ?44 1.1     Pop fossa EDB 17.0  0.4  75.8 Pop fossa - Fib head 12 35 ?44 0.8         Pop fossa - Ankle      L Peroneal - EDB     Ankle EDB 5.5 ?6.5 1.2 ?2.0 100 Ankle - EDB 9   3.9     Fib head EDB 13.5  1.4  113 Fib head - Ankle 33 41 ?44 4.7     Pop fossa EDB 16.3  1.2  84.7 Pop fossa - Fib head 12 43 ?44 3.4  Pop fossa - Ankle      R Tibial - AH     Ankle AH 5.1 ?5.8 1.6 ?4.0 100 Ankle - AH 9   3.5     Pop fossa AH 15.3  0.7  44 Pop fossa - Ankle 38 37 ?41 3.1  L Tibial - AH     Ankle AH 4.9 ?5.8 1.3 ?4.0 100 Ankle - AH 9   5.6     Pop fossa AH 15.7  1.0  81.9 Pop fossa - Ankle 38 35 ?41 6.9                   SNC    Nerve / Sites Rec. Site Peak Lat Ref.  Amp Ref. Segments Distance    ms ms V V  cm  R Sural - Ankle (Calf)     Calf Ankle 4.1 ?4.4 2 ?6 Calf - Ankle 14  L Sural - Ankle (Calf)     Calf Ankle 4.2 ?4.4 2 ?6 Calf - Ankle 14  L Superficial peroneal - Ankle     Lat leg Ankle NR ?4.4 NR ?6 Lat leg - Ankle 14  R Superficial peroneal - Ankle     Lat leg Ankle NR ?4.4 NR ?6 Lat leg - Ankle 14  R Median - Orthodromic (Dig II, Mid palm)     Dig II Wrist 4.9 ?3.4  ?10 Dig II - Wrist 13  L Median - Orthodromic (Dig II, Mid palm)     Dig II Wrist 4.1 ?3.4 6 ?10 Dig II - Wrist 13  R Ulnar - Orthodromic, (Dig V, Mid palm)     Dig V Wrist 2.9 ?3.1 5 ?5 Dig V - Wrist 11  L Ulnar -  Orthodromic, (Dig V, Mid palm)     Dig V Wrist 3.0 ?3.1 6 ?5 Dig V - Wrist 65                     F  Wave    Nerve F Lat Ref.   ms ms  R Tibial - AH 61.5 ?56.0  L Tibial - AH 60.9 ?56.0  R Ulnar - ADM 28.1 ?32.0           EMG full       EMG Summary Table    Spontaneous MUAP Recruitment  Muscle IA Fib PSW Fasc Other Amp Dur. Poly Pattern  R. Tibialis anterior Normal None None None _______ Normal Normal Normal Normal  R. Gastrocnemius (Medial head) Normal None None None _______ Normal Normal Normal Normal  R. Vastus lateralis Normal None None None _______ Normal Normal Normal Normal  R. Extensor digitorum brevis Normal None None None _______ Normal Normal Normal Normal  L. Tibialis anterior Normal None None None _______ Normal Normal Normal Normal  L. Gastrocnemius (Medial head) Normal None None None _______ Normal Normal Normal Normal  L. Vastus lateralis Normal None None None _______ Normal Normal Normal Normal  L. Lumbar paraspinals (low) Normal None None None _______ Normal Normal Normal Normal  L. Lumbar paraspinals (mid) Normal None None None _______ Normal Normal Normal Normal  R. Lumbar paraspinals (low) Normal None None None _______ Normal Normal Normal Normal  R. Lumbar paraspinals (mid) Normal None None None _______ Normal Normal Normal Normal  R. First dorsal interosseous Normal None None None _______ Normal Normal Normal Normal  R.  Pronator teres Normal None None None _______ Normal Normal Normal Normal  R. Biceps brachii Normal None  None None _______ Normal Normal Normal Normal  R. Cervical paraspinals Normal None None None _______ Normal Normal Normal Normal

## 2018-01-15 ENCOUNTER — Other Ambulatory Visit: Payer: Self-pay | Admitting: Family Medicine

## 2018-01-15 DIAGNOSIS — I10 Essential (primary) hypertension: Secondary | ICD-10-CM

## 2018-01-16 NOTE — Telephone Encounter (Signed)
Toprol  XL refill request  LOV 08/07/17 with Dr. Carlota Raspberry.   Looks like his BP medication was adjusted but I don't see where he came in for the 10 day follow up appt.  [facility], [place]

## 2018-01-16 NOTE — Telephone Encounter (Signed)
Refill request for metoprolol 50 mg # 30 with 0 refills. Pt last visit 08/07/2017. No future appt scheduled.  Will send to scheduling pool to call pt and setup ov f/u HTN. Dgaddy, CMA

## 2018-01-16 NOTE — Telephone Encounter (Signed)
Patient is calling to check on the status of this medication. He states he leaves out of town early in the morning and that he is completely out. I notified the patient that RX refills can take 24-72 hours.

## 2018-01-18 ENCOUNTER — Ambulatory Visit: Payer: BLUE CROSS/BLUE SHIELD | Admitting: Adult Health

## 2018-04-27 ENCOUNTER — Telehealth: Payer: Self-pay | Admitting: Family Medicine

## 2018-04-27 NOTE — Telephone Encounter (Signed)
Patient was upset about his medical records stating he has alcohol abuse because he was being denied life insurance coverage.he stated he wanted his record changed or he would be contacting his lawyer. After speaking with the patient I notified the HIM dept. They gave me the proper forms that the patient has to fill out and send back. I called the patient on 04/27/18 and explained what needs to be done to get his record changed he verbalized understanding and told him that the form would be placed in the mail to him today.

## 2018-06-25 ENCOUNTER — Telehealth: Payer: Self-pay | Admitting: Family Medicine

## 2018-06-25 ENCOUNTER — Other Ambulatory Visit: Payer: Self-pay

## 2018-06-25 ENCOUNTER — Encounter (HOSPITAL_BASED_OUTPATIENT_CLINIC_OR_DEPARTMENT_OTHER): Payer: Self-pay | Admitting: Emergency Medicine

## 2018-06-25 DIAGNOSIS — Z85828 Personal history of other malignant neoplasm of skin: Secondary | ICD-10-CM | POA: Insufficient documentation

## 2018-06-25 DIAGNOSIS — T50905A Adverse effect of unspecified drugs, medicaments and biological substances, initial encounter: Secondary | ICD-10-CM | POA: Insufficient documentation

## 2018-06-25 DIAGNOSIS — Z79899 Other long term (current) drug therapy: Secondary | ICD-10-CM | POA: Insufficient documentation

## 2018-06-25 DIAGNOSIS — I10 Essential (primary) hypertension: Secondary | ICD-10-CM | POA: Insufficient documentation

## 2018-06-25 DIAGNOSIS — R51 Headache: Secondary | ICD-10-CM | POA: Insufficient documentation

## 2018-06-25 NOTE — ED Triage Notes (Signed)
Pt states he has had a headache for the past couple of days and today his blood pressure has been elevated

## 2018-06-25 NOTE — Telephone Encounter (Signed)
After hours call from team health nurse. Patient reported being seen today (but no record of visit today noted).  He complained of headache for past few days. Home BP 182/102.  Due to symptoms and level of home reading, was advised to be seen in ER.   Agree with disposition above, but please call to check status and schedule follow up.  He has not seen me since October 2018, and advised follow up in 10 days at that time.   30 day Rx of metoprolol on 01/16/18 #90 and 1 refill of losartan hct on 06/29/17.

## 2018-06-26 ENCOUNTER — Emergency Department (HOSPITAL_BASED_OUTPATIENT_CLINIC_OR_DEPARTMENT_OTHER)
Admission: EM | Admit: 2018-06-26 | Discharge: 2018-06-26 | Disposition: A | Discharge status: Home / Self Care | Payer: BLUE CROSS/BLUE SHIELD | Attending: Emergency Medicine | Admitting: Emergency Medicine

## 2018-06-26 DIAGNOSIS — T50905A Adverse effect of unspecified drugs, medicaments and biological substances, initial encounter: Secondary | ICD-10-CM

## 2018-06-26 NOTE — ED Notes (Signed)
ED Provider at bedside. 

## 2018-06-26 NOTE — ED Provider Notes (Signed)
[facility] DEPT [facility] Provider Note: Georgena Spurling, MD, FACEP  CSN: [id number] MRN: [id number] ARRIVAL: 06/25/18 at 2237 ROOM: [place]  Headache   HISTORY OF PRESENT ILLNESS  06/26/18 2:42 AM Shane Sawyer is a 62 y.o. male who has been having headaches for the past several days.  He was seen at his PCPs office yesterday and told that his blood pressure was 155/93.  His blood pressure here was noted to be as high as 193/88 and is low as 159/95.  His headaches are not severe and he is having no associated visual changes, numbness, weakness, chest pain or shortness of breath.  He admits to using stanozolol, and anabolic steroid, for about the past 6 weeks.  He is concerned that this may be responsible for his elevated blood pressure and headaches.    Past Medical History:  Diagnosis Date  . Cancer ([place])    skin, left temple  . Erectile dysfunction   . HBP (high blood pressure)   . History of echocardiogram    Echo 3/18: EF 55-60, no RWMA, PASP 27  . History of nuclear stress test    Myoview 3/18: EF 50, inf thinning, no ischemia, normal wall motion; Low Risk    Past Surgical History:  Procedure Laterality Date  . NOSE SURGERY     times 2  . SKIN CANCER EXCISION  12/2016    Family History  Problem Relation Age of Onset  . Bladder Cancer Father   . Heart failure Mother   . Valvular heart disease Mother   . Colon cancer Neg Hx   . Heart attack Neg Hx     Social History   Tobacco Use  . Smoking status: Never Smoker  . Smokeless tobacco: Never Used  Substance Use Topics  . Alcohol use: Yes    Alcohol/week: 2.0 standard drinks    Types: 2 Cans of beer per week    Comment: weekly   . Drug use: No    Prior to Admission medications   Medication Sig Start Date End Date Taking? Authorizing Provider  AMBULATORY NON FORMULARY MEDICATION Emergency B complex Takes one tablet daily   Yes [provider]  AMBULATORY NON FORMULARY MEDICATION  L-arginine Take one tablet daily   Yes [provider]  APPLE CIDER VINEGAR PO Take 1 Dose by mouth daily.   Yes [provider]  COD LIVER OIL PO Take 1 Dose by mouth daily.   Yes [provider]  Coenzyme Q10 (CO Q-10 PO) Take 1 tablet by mouth daily.   Yes [provider]  FOLIC ACID PO Take 1 tablet by mouth daily.   Yes [provider]  GARLIC PO Take 1 tablet by mouth daily.   Yes [provider]  losartan-hydrochlorothiazide (HYZAAR) 100-25 MG tablet Take 1 tablet by mouth daily. 06/29/17  Yes [name], MD  MILK THISTLE PO Take 1 tablet by mouth daily.   Yes [provider]  Niacin (VITAMIN B-3 PO) Take 1 tablet by mouth daily. Reported on 05/10/2016   Yes [provider]  Omega-3 Fatty Acids (FISH OIL PO) Take 1 tablet by mouth daily.   Yes [provider]  omeprazole (PRILOSEC) 20 MG capsule Take 20 mg by mouth daily.   Yes [provider]  Plant Sterols and Stanols (CHOLEST OFF PO) Take 1 tablet by mouth daily.   Yes [provider]  thiamine (VITAMIN B-1) 100 MG tablet Take 100 mg  by mouth daily.   Yes [provider]  metoprolol succinate (TOPROL-XL) 50 MG 24 hr tablet TAKE 1 TABLET BY MOUTH DAILY WITH OR IMMEDIATELY FOLLOWING A MEAL 01/16/18   [name], MD  PROAIR HFA [phone] Base) MCG/ACT inhaler INHALE 1 PUFF  INTO THE LUNGS Q 4 TO 6 H PRN 07/22/17   [provider]  tadalafil (CIALIS) 20 MG tablet Take 20 mg by mouth daily as needed for erectile dysfunction.    [provider]    Allergies Ivp dye [iodinated diagnostic agents]; Penicillins; and Sulfur   REVIEW OF SYSTEMS  Negative except as noted here or in the History of Present Illness.   PHYSICAL EXAMINATION  Initial Vital Signs Blood pressure (!) 159/95, pulse 81, temperature 98.3 F (36.8 C), temperature source Oral, resp. rate 18, height 6' 1.5" (1.867 m), weight 111.1 kg, SpO2  98 %.  Examination General: Well-developed, well-nourished male in no acute distress; appearance consistent with age of record HENT: normocephalic; atraumatic Eyes: pupils equal, round and reactive to light; extraocular muscles intact Neck: supple Heart: regular rate and rhythm Lungs: clear to auscultation bilaterally Abdomen: soft; nondistended; nontender; bowel sounds present Extremities: No deformity; full range of motion; pulses normal Neurologic: Awake, alert and oriented; motor function intact in all extremities and symmetric; no facial droop Skin: Warm and dry Psychiatric: Normal mood and affect   RESULTS  Summary of this visit's results, reviewed by myself:   EKG Interpretation  Date/Time:    Ventricular Rate:    PR Interval:    QRS Duration:   QT Interval:    QTC Calculation:   R Axis:     Text Interpretation:        Laboratory Studies: No results found for this or any previous visit (from the past 24 hour(s)). Imaging Studies: No results found.  ED COURSE and MDM  Nursing notes and initial vitals signs, including pulse oximetry, reviewed.  Vitals:   06/26/18 0058 06/26/18 0059 06/26/18 0110 06/26/18 0130  BP: (!) 193/88 (!) 160/96 (!) 173/92 (!) 159/95  Pulse: 77  82 81  Resp: 18     Temp: 98.3 F (36.8 C)     TempSrc: Oral     SpO2: 100%  100% 98%  Weight:      Height:       The patient was advised to discontinue the stanozolol as hypertension is a known side effect of anabolic steroids.  He agrees to discontinue it and will monitor his blood pressure.  He has a follow-up appointment pending with his primary care physician.  PROCEDURES    ED DIAGNOSES     ICD-10-CM   1. Adverse drug effect, initial encounter T50.905A        Avi Archuleta, [name], MD 06/26/18 [phone]

## 2018-06-28 NOTE — Telephone Encounter (Signed)
Message sent to Dr. Carlota Raspberry.  Pt was seen at [facility] for adverse drug effect

## 2018-06-30 NOTE — Telephone Encounter (Signed)
Noted. He appears ot have PCP appt setup with Shawna Clamp 07/26/18.

## 2018-07-11 ENCOUNTER — Telehealth: Payer: Self-pay | Admitting: Family Medicine

## 2018-07-11 NOTE — Telephone Encounter (Signed)
Copied from [facility] [id number]. Topic: Quick Communication - See Telephone Encounter >> Jul 11, 2018  2:32 PM Neva Seat wrote: Estill Bamberg w/ [facility]  [phone]  - Ext  [phone]  Needing to verify the office received a fax regarding medical records for the pt.

## 2018-07-12 NOTE — Telephone Encounter (Signed)
Checking status again. Please advise

## 2018-07-16 NOTE — Telephone Encounter (Signed)
Addie please advise. Dgaddy, CMA

## 2018-07-17 NOTE — Telephone Encounter (Signed)
Estill Bamberg calling again to advise if you have this request.

## 2018-07-23 NOTE — Telephone Encounter (Signed)
Shane Sawyer called from [facility] to f/u on MR request sent 07/11/18. Please advise.

## 2018-09-14 ENCOUNTER — Other Ambulatory Visit: Payer: Self-pay | Admitting: Family Medicine

## 2018-09-14 DIAGNOSIS — I10 Essential (primary) hypertension: Secondary | ICD-10-CM

## 2019-12-02 ENCOUNTER — Encounter (HOSPITAL_BASED_OUTPATIENT_CLINIC_OR_DEPARTMENT_OTHER): Payer: Self-pay

## 2019-12-02 ENCOUNTER — Other Ambulatory Visit: Payer: Self-pay

## 2019-12-02 ENCOUNTER — Emergency Department (HOSPITAL_BASED_OUTPATIENT_CLINIC_OR_DEPARTMENT_OTHER): Payer: BLUE CROSS/BLUE SHIELD

## 2019-12-02 ENCOUNTER — Emergency Department (HOSPITAL_BASED_OUTPATIENT_CLINIC_OR_DEPARTMENT_OTHER)
Admission: EM | Admit: 2019-12-02 | Discharge: 2019-12-02 | Disposition: A | Discharge status: Home / Self Care | Payer: BLUE CROSS/BLUE SHIELD | Attending: Emergency Medicine | Admitting: Emergency Medicine

## 2019-12-02 DIAGNOSIS — R109 Unspecified abdominal pain: Secondary | ICD-10-CM | POA: Insufficient documentation

## 2019-12-02 DIAGNOSIS — I10 Essential (primary) hypertension: Secondary | ICD-10-CM | POA: Diagnosis not present

## 2019-12-02 DIAGNOSIS — R509 Fever, unspecified: Secondary | ICD-10-CM | POA: Diagnosis present

## 2019-12-02 DIAGNOSIS — R519 Headache, unspecified: Secondary | ICD-10-CM | POA: Diagnosis not present

## 2019-12-02 DIAGNOSIS — Z91041 Radiographic dye allergy status: Secondary | ICD-10-CM | POA: Insufficient documentation

## 2019-12-02 DIAGNOSIS — Z85828 Personal history of other malignant neoplasm of skin: Secondary | ICD-10-CM | POA: Diagnosis not present

## 2019-12-02 DIAGNOSIS — Z79899 Other long term (current) drug therapy: Secondary | ICD-10-CM | POA: Diagnosis not present

## 2019-12-02 DIAGNOSIS — Z880 Allergy status to penicillin: Secondary | ICD-10-CM | POA: Insufficient documentation

## 2019-12-02 DIAGNOSIS — N10 Acute tubulo-interstitial nephritis: Secondary | ICD-10-CM | POA: Insufficient documentation

## 2019-12-02 DIAGNOSIS — N12 Tubulo-interstitial nephritis, not specified as acute or chronic: Secondary | ICD-10-CM

## 2019-12-02 DIAGNOSIS — Z20822 Contact with and (suspected) exposure to covid-19: Secondary | ICD-10-CM | POA: Insufficient documentation

## 2019-12-02 DIAGNOSIS — R000 Tachycardia, unspecified: Secondary | ICD-10-CM | POA: Diagnosis not present

## 2019-12-02 DIAGNOSIS — R05 Cough: Secondary | ICD-10-CM | POA: Diagnosis not present

## 2019-12-02 LAB — COMPREHENSIVE METABOLIC PANEL
ALT: 29 U/L (ref 0–44)
AST: 27 U/L (ref 15–41)
Albumin: 3.6 g/dL (ref 3.5–5.0)
Alkaline Phosphatase: 36 U/L — ABNORMAL LOW (ref 38–126)
Anion gap: 7 (ref 5–15)
BUN: 16 mg/dL (ref 8–23)
CO2: 23 mmol/L (ref 22–32)
Calcium: 8.3 mg/dL — ABNORMAL LOW (ref 8.9–10.3)
Chloride: 93 mmol/L — ABNORMAL LOW (ref 98–111)
Creatinine, Ser: 1.02 mg/dL (ref 0.61–1.24)
GFR calc Af Amer: >60 mL/min (ref >60)
GFR calc non Af Amer: >60 mL/min (ref >60)
Glucose, Bld: 148 mg/dL — ABNORMAL HIGH (ref 70–99)
Potassium: 3.6 mmol/L (ref 3.5–5.1)
Sodium: 123 mmol/L — ABNORMAL LOW (ref 135–145)
Total Bilirubin: 1.5 mg/dL — ABNORMAL HIGH (ref 0.3–1.2)
Total Protein: 6.9 g/dL (ref 6.5–8.1)

## 2019-12-02 LAB — URINALYSIS, ROUTINE W REFLEX MICROSCOPIC
Glucose, UA: 100 mg/dL — AB
Hgb urine dipstick: NEGATIVE
Ketones, ur: 15 mg/dL — AB
Nitrite: POSITIVE — AB
Protein, ur: 100 mg/dL — AB
Specific Gravity, Urine: 1.01 (ref 1.005–1.030)
pH: 6.5 (ref 5.0–8.0)

## 2019-12-02 LAB — CBC WITH DIFFERENTIAL/PLATELET
Abs Immature Granulocytes: 0.15 10*3/uL — ABNORMAL HIGH (ref 0.00–0.07)
Basophils Absolute: 0 10*3/uL (ref 0.0–0.1)
Basophils Relative: 0 %
Eosinophils Absolute: 0 10*3/uL (ref 0.0–0.5)
Eosinophils Relative: 0 %
HCT: 41.1 % (ref 39.0–52.0)
Hemoglobin: 14.4 g/dL (ref 13.0–17.0)
Immature Granulocytes: 1 %
Lymphocytes Relative: 3 %
Lymphs Abs: 0.4 10*3/uL — ABNORMAL LOW (ref 0.7–4.0)
MCH: 31.6 pg (ref 26.0–34.0)
MCHC: 35 g/dL (ref 30.0–36.0)
MCV: 90.1 fL (ref 80.0–100.0)
Monocytes Absolute: 1.1 10*3/uL — ABNORMAL HIGH (ref 0.1–1.0)
Monocytes Relative: 7 %
Neutro Abs: 12.8 10*3/uL — ABNORMAL HIGH (ref 1.7–7.7)
Neutrophils Relative %: 89 %
Platelets: 129 10*3/uL — ABNORMAL LOW (ref 150–400)
RBC: 4.56 MIL/uL (ref 4.22–5.81)
RDW: 12.8 % (ref 11.5–15.5)
WBC: 14.5 10*3/uL — ABNORMAL HIGH (ref 4.0–10.5)
nRBC: 0 % (ref 0.0–0.2)

## 2019-12-02 LAB — URINALYSIS, MICROSCOPIC (REFLEX)

## 2019-12-02 LAB — PROTIME-INR
INR: 1.1 (ref 0.8–1.2)
Prothrombin Time: 14.3 seconds (ref 11.4–15.2)

## 2019-12-02 LAB — LACTIC ACID, PLASMA: Lactic Acid, Venous: 1.2 mmol/L (ref 0.5–1.9)

## 2019-12-02 LAB — APTT: aPTT: 34 seconds (ref 24–36)

## 2019-12-02 LAB — SARS CORONAVIRUS 2 AG (30 MIN TAT): SARS Coronavirus 2 Ag: NEGATIVE

## 2019-12-02 MED ORDER — CIPROFLOXACIN IN D5W 400 MG/200ML IV SOLN
400.0000 mg | Freq: Once | INTRAVENOUS | Status: AC
  Administered 2019-12-02: 15:00:00 400 mg via INTRAVENOUS
  Filled 2019-12-02: qty 200

## 2019-12-02 MED ORDER — SODIUM CHLORIDE 0.9 % IV BOLUS
1000.0000 mL | Freq: Once | INTRAVENOUS | Status: AC
  Administered 2019-12-02: 1000 mL via INTRAVENOUS

## 2019-12-02 MED ORDER — CIPROFLOXACIN HCL 500 MG PO TABS: 500.0000 mg | ORAL_TABLET | Freq: Two times a day (BID) | ORAL | 0 refills | Status: DC

## 2019-12-02 MED ORDER — SODIUM CHLORIDE 0.9 % IV BOLUS (SEPSIS)
1000.0000 mL | Freq: Once | INTRAVENOUS | Status: AC
  Administered 2019-12-02: 1000 mL via INTRAVENOUS

## 2019-12-02 NOTE — ED Triage Notes (Addendum)
Pt c/o fever,HA, abd pain, cough, chest tightness, foul smelling urine-sx started last week-pt sent from PCP to r/o pyelonephritis and sepsis-NAD-steady gait

## 2019-12-02 NOTE — ED Provider Notes (Signed)
[facility] EMERGENCY DEPARTMENT Provider Note   CSN: [id number] Arrival date & time: 12/02/19  1337     History Chief Complaint  Patient presents with   Fever    Shane Sawyer is a 64 y.o. male with PMHx HTN who presents to the ED today from PCP's office for complaint of foul smelling urine x 5-6 days. Pt also complains of urinary urgency but decreased voiding, mild dysuria, and fevers with tmax 102.6 at home. He reports he began having a dry cough and a mild headache the past couple of days. Pt went to his PCP's office today and was sent here for further evaluation with concern for sepsis secondary to pyelonephritis. While at his PCP's office his temp was 102.7 axillary. Oral temp 98.6 however patient had been drinking cold water. He was unable to provide a urine sample at the PCP's office. He reports hx of either bladder infection or kidney infection in the past however he is unsure. Denies chest pain, shortness of breath, vomiting, diarrhea, constipation, blood in stool, testicular pain or swelling, or any other associated symptoms.   The history is provided by the patient and medical records.       Past Medical History:  Diagnosis Date   Cancer ([place])    skin, left temple   Erectile dysfunction    HBP (high blood pressure)    History of echocardiogram    Echo 3/18: EF 55-60, no RWMA, PASP 27   History of nuclear stress test    Myoview 3/18: EF 50, inf thinning, no ischemia, normal wall motion; Low Risk    Patient Active Problem List   Diagnosis Date Noted   Peripheral polyneuropathy 10/20/2017   Paresthesia 09/25/2017   Rectal bleeding 09/21/2015   Change in bowel habits 09/21/2015   Special screening for malignant neoplasms, colon 09/21/2015   Tremor 09/10/2013   Unspecified essential hypertension 08/29/2013    Past Surgical History:  Procedure Laterality Date   NOSE SURGERY     times 2   SKIN CANCER EXCISION  12/2016       Family  History  Problem Relation Age of Onset   Bladder Cancer Father    Heart failure Mother    Valvular heart disease Mother    Colon cancer Neg Hx    Heart attack Neg Hx     Social History   Tobacco Use   Smoking status: Never Smoker   Smokeless tobacco: Never Used  Substance Use Topics   Alcohol use: Yes    Comment: daily   Drug use: No    Home Medications Prior to Admission medications   Medication Sig Start Date End Date Taking? Authorizing Provider  AMBULATORY NON FORMULARY MEDICATION Emergency B complex Takes one tablet daily    [provider]  AMBULATORY NON FORMULARY MEDICATION L-arginine Take one tablet daily    [provider]  APPLE CIDER VINEGAR PO Take 1 Dose by mouth daily.    [provider]  ciprofloxacin (CIPRO) 500 MG tablet Take 1 tablet (500 mg total) by mouth 2 (two) times daily. 12/02/19   Atina Feeley, PA-C  COD LIVER OIL PO Take 1 Dose by mouth daily.    [provider]  Coenzyme Q10 (CO Q-10 PO) Take 1 tablet by mouth daily.    [provider]  FOLIC ACID PO Take 1 tablet by mouth daily.    [provider]  GARLIC PO Take 1 tablet by mouth daily.    [provider]  losartan-hydrochlorothiazide (HYZAAR) 100-25 MG tablet Take 1 tablet by mouth daily. 06/29/17   [name], MD  MILK THISTLE PO Take 1 tablet by mouth daily.    [provider]  Niacin (VITAMIN B-3 PO) Take 1 tablet by mouth daily. Reported on 05/10/2016    [provider]  Omega-3 Fatty Acids (FISH OIL PO) Take 1 tablet by mouth daily.    [provider]  omeprazole (PRILOSEC) 20 MG capsule Take 20 mg by mouth daily.    [provider]  Plant Sterols and Stanols (CHOLEST OFF PO) Take 1 tablet by mouth daily.    [provider]  tadalafil (CIALIS) 20 MG tablet Take 20 mg by mouth daily as needed for erectile dysfunction.    [provider]  thiamine (VITAMIN B-1)  100 MG tablet Take 100 mg by mouth daily.    [provider]    Allergies    Ivp dye [iodinated diagnostic agents], Penicillins, and Sulfur  Review of Systems   Review of Systems  Constitutional: Positive for chills, fatigue and fever.  HENT: Negative for congestion.   Eyes: Negative for visual disturbance.  Respiratory: Positive for cough. Negative for shortness of breath.   Cardiovascular: Negative for chest pain.  Gastrointestinal: Positive for abdominal pain and nausea. Negative for constipation, diarrhea, rectal pain and vomiting.  Genitourinary: Positive for difficulty urinating, dysuria and urgency. Negative for discharge, flank pain, hematuria, penile swelling and scrotal swelling.  Musculoskeletal: Negative for neck pain and neck stiffness.  Skin: Negative for rash.  Neurological: Positive for headaches.    Physical Exam Updated Vital Signs BP 109/73 (BP Location: Left Arm)    Pulse (!) 124    Temp 99.8 F (37.7 C) (Oral)    Resp 20    SpO2 96%   Physical Exam Vitals and nursing note reviewed.  Constitutional:      Appearance: He is obese. He is not ill-appearing.  HENT:     Head: Normocephalic and atraumatic.     Mouth/Throat:     Mouth: Mucous membranes are dry.  Eyes:     Conjunctiva/sclera: Conjunctivae normal.  Cardiovascular:     Rate and Rhythm: Regular rhythm. Tachycardia present.  Pulmonary:     Effort: Pulmonary effort is normal.     Breath sounds: Normal breath sounds. No wheezing, rhonchi or rales.  Abdominal:     Palpations: Abdomen is soft.     Tenderness: There is no abdominal tenderness. There is no right CVA tenderness, left CVA tenderness, guarding or rebound.  Musculoskeletal:     Cervical back: Neck supple.     Right lower leg: No edema.     Left lower leg: No edema.  Skin:    General: Skin is warm and dry.  Neurological:     Mental Status: He is alert.     ED Results / Procedures / Treatments   Labs (all labs ordered are  listed, but only abnormal results are displayed) Labs Reviewed  COMPREHENSIVE METABOLIC PANEL - Abnormal; Notable for the following components:      Result Value   Sodium 123 (*)    Chloride 93 (*)    Glucose, Bld 148 (*)    Calcium 8.3 (*)    Alkaline Phosphatase 36 (*)    Total Bilirubin 1.5 (*)    All other components within normal limits  CBC WITH DIFFERENTIAL/PLATELET - Abnormal; Notable for the following components:   WBC 14.5 (*)  Platelets 129 (*)    Neutro Abs 12.8 (*)    Lymphs Abs 0.4 (*)    Monocytes Absolute 1.1 (*)    Abs Immature Granulocytes 0.15 (*)    All other components within normal limits  URINALYSIS, ROUTINE W REFLEX MICROSCOPIC - Abnormal; Notable for the following components:   Color, Urine ORANGE (*)    APPearance CLOUDY (*)    Glucose, UA 100 (*)    Bilirubin Urine SMALL (*)    Ketones, ur 15 (*)    Protein, ur 100 (*)    Nitrite POSITIVE (*)    Leukocytes,Ua MODERATE (*)    All other components within normal limits  URINALYSIS, MICROSCOPIC (REFLEX) - Abnormal; Notable for the following components:   Bacteria, UA MANY (*)    All other components within normal limits  SARS CORONAVIRUS 2 AG (30 MIN TAT)  CULTURE, BLOOD (ROUTINE X 2)  CULTURE, BLOOD (ROUTINE X 2)  URINE CULTURE  NOVEL CORONAVIRUS, NAA (HOSP ORDER, SEND-OUT TO REF LAB; TAT 18-24 HRS)  LACTIC ACID, PLASMA  APTT  PROTIME-INR    EKG None  Radiology DG Chest Port 1 View  Result Date: 12/02/2019 CLINICAL DATA:  Cough, fever, abdominal pain EXAM: PORTABLE CHEST 1 VIEW COMPARISON:  07/31/2017 FINDINGS: The heart size and mediastinal contours are within normal limits. Both lungs are clear. The visualized skeletal structures are unremarkable. IMPRESSION: No active disease. Electronically Signed   By: Randa Ngo M.D.   On: 12/02/2019 15:05    Procedures Procedures (including critical care time)  Medications Ordered in ED Medications  ciprofloxacin (CIPRO) IVPB 400 mg (0 mg  Intravenous Stopped 12/02/19 1612)  sodium chloride 0.9 % bolus 1,000 mL (0 mLs Intravenous Stopped 12/02/19 1612)  sodium chloride 0.9 % bolus 1,000 mL (1,000 mLs Intravenous New Bag/Given 12/02/19 1706)    ED Course  I have reviewed the triage vital signs and the nursing notes.  Pertinent labs & imaging results that were available during my care of the patient were reviewed by me and considered in my medical decision making (see chart for details).  Clinical Course as of Dec 02 1731  Mon Dec 02, 2019  1541 Lactic Acid, Venous: 1.2 [MV]  1541 WBC(!): 14.5 [MV]  1542 Bladder Scan Volume (mL): 143 mL   [MV]    Clinical Course User Index [MV] Eustaquio Maize, PA-C   64 year old male who presents to the ED today after being seen at PCPs office, sent here to rule out sepsis secondary to [place].  Reports he has been having foul-smelling urine for the past 5 to 6 days.  Also complaining of fevers, cough, headache.  PCPs office patient's temp 102.6 axillary.  Arrival to the ED 99.8 however tachycardic in the 120s.  Will work-up for sepsis at this time.  Will obtain bladder scan as patient reports he is having difficulty voiding.  Will obtain urinalysis.  Chest x-ray as well as Covid swab due to complaint of cough and headache.  Fluids given.   CBC with leukocytosis 14.5; already receiving Cipro with suspected source of infection pyelo. Has significant anaphylactic allergy to both PCNs and sulfa abx. Unfortunately pt has severe contrast dye allergy; he has not CVA tenderness or focal abd tenderness on exam; do not feel he needs imaging at this time.  Lactic acid within normal limits.  CMP with sodium 123; receiving fluids. Creatinine 1.02.  CXR negative for infection,  Rapid covid negative.   Urinalysis with + nitrites, moderate leuks, 21-50 WBCs  and many bacteria.   On reevaluation pt's hearrate has improved however still in the low 100's, will give second bag of fluids.   After second liter of  fluids patients HR in the high 90s. Feel he is stable for discharge at this time. Have swabbed for send out covid test. He is advised to self isolate until he receives his results. If positive he needs to self isolate for 14 days. He is advised to follow up with his PCP in 1-2 weeks for recheck of his urine. Strict return precautions discussed as well. Pt is in agreement with plan and stable for discharge home.   This note was prepared using Dragon voice recognition software and may include unintentional dictation errors due to the inherent limitations of voice recognition software.    MDM Rules/Calculators/A&P                       Final Clinical Impression(s) / ED Diagnoses Final diagnoses:  Pyelonephritis  Fever, unspecified fever cause    Rx / DC Orders ED Discharge Orders         Ordered    ciprofloxacin (CIPRO) 500 MG tablet  2 times daily     12/02/19 1707           Discharge Instructions     Please pick up antibiotics and take as prescribed. We have sent your urine for culture - you will receive a call in 2-3 days IF the antibiotic is not appropriately covering your infection.   We have also swabbed you for COVID 19 today - please stay home and self isolate until you receive your results. We will call you if you are positive. If negative you will not receive a call; you can log into your MyChart to check your results.   Please follow up with your PCP in 1-2 weeks to have your urine rechecked to ensure your infection has improved.   Return to the ED for any worsening symptoms including no improvement in urinary symptoms after 48 hours on antibiotics, continuation of fevers after 48 hours of antibiotic use, worsening abdominal or  back pain, inability to urinate, or any other concerning symptoms.        Eustaquio Maize, PA-C 12/02/19 1735    Sherwood Gambler, MD 12/04/19 [phone]

## 2019-12-02 NOTE — ED Notes (Signed)
Oral temp 99.21F, pt states taking 1000mg  tylenol at 11 this am. C/o headache and dehydration.

## 2019-12-02 NOTE — Discharge Instructions (Addendum)
Please pick up antibiotics and take as prescribed. We have sent your urine for culture - you will receive a call in 2-3 days IF the antibiotic is not appropriately covering your infection.   We have also swabbed you for COVID 19 today - please stay home and self isolate until you receive your results. We will call you if you are positive. If negative you will not receive a call; you can log into your MyChart to check your results.   Please follow up with your PCP in 1-2 weeks to have your urine rechecked to ensure your infection has improved.   Return to the ED for any worsening symptoms including no improvement in urinary symptoms after 48 hours on antibiotics, continuation of fevers after 48 hours of antibiotic use, worsening abdominal or  back pain, inability to urinate, or any other concerning symptoms.

## 2019-12-03 LAB — URINE CULTURE: Culture: NO GROWTH

## 2019-12-03 LAB — NOVEL CORONAVIRUS, NAA (HOSP ORDER, SEND-OUT TO REF LAB; TAT 18-24 HRS): SARS-CoV-2, NAA: NOT DETECTED

## 2019-12-07 LAB — CULTURE, BLOOD (ROUTINE X 2)
Culture: NO GROWTH
Culture: NO GROWTH
Special Requests: ADEQUATE
Special Requests: ADEQUATE

## 2020-06-13 ENCOUNTER — Other Ambulatory Visit: Payer: Self-pay

## 2020-06-13 ENCOUNTER — Emergency Department (HOSPITAL_BASED_OUTPATIENT_CLINIC_OR_DEPARTMENT_OTHER): Payer: BLUE CROSS/BLUE SHIELD

## 2020-06-13 ENCOUNTER — Emergency Department (HOSPITAL_BASED_OUTPATIENT_CLINIC_OR_DEPARTMENT_OTHER)
Admission: EM | Admit: 2020-06-13 | Discharge: 2020-06-13 | Disposition: A | Discharge status: Home / Self Care | Payer: BLUE CROSS/BLUE SHIELD | Attending: Emergency Medicine | Admitting: Emergency Medicine

## 2020-06-13 ENCOUNTER — Encounter (HOSPITAL_BASED_OUTPATIENT_CLINIC_OR_DEPARTMENT_OTHER): Payer: Self-pay | Admitting: Emergency Medicine

## 2020-06-13 DIAGNOSIS — R1010 Upper abdominal pain, unspecified: Secondary | ICD-10-CM | POA: Insufficient documentation

## 2020-06-13 DIAGNOSIS — R0789 Other chest pain: Secondary | ICD-10-CM | POA: Diagnosis present

## 2020-06-13 DIAGNOSIS — Z5321 Procedure and treatment not carried out due to patient leaving prior to being seen by health care provider: Secondary | ICD-10-CM | POA: Insufficient documentation

## 2020-06-13 LAB — CBC
HCT: 40 % (ref 39.0–52.0)
Hemoglobin: 12.7 g/dL — ABNORMAL LOW (ref 13.0–17.0)
MCH: 26.3 pg (ref 26.0–34.0)
MCHC: 31.8 g/dL (ref 30.0–36.0)
MCV: 83 fL (ref 80.0–100.0)
Platelets: 362 10*3/uL (ref 150–400)
RBC: 4.82 MIL/uL (ref 4.22–5.81)
RDW: 14.6 % (ref 11.5–15.5)
WBC: 10.7 10*3/uL — ABNORMAL HIGH (ref 4.0–10.5)
nRBC: 0 % (ref 0.0–0.2)

## 2020-06-13 LAB — BASIC METABOLIC PANEL
Anion gap: 9 (ref 5–15)
BUN: 16 mg/dL (ref 8–23)
CO2: 24 mmol/L (ref 22–32)
Calcium: 8.7 mg/dL — ABNORMAL LOW (ref 8.9–10.3)
Chloride: 95 mmol/L — ABNORMAL LOW (ref 98–111)
Creatinine, Ser: 0.76 mg/dL (ref 0.61–1.24)
GFR calc Af Amer: >60 mL/min (ref >60)
GFR calc non Af Amer: >60 mL/min (ref >60)
Glucose, Bld: 116 mg/dL — ABNORMAL HIGH (ref 70–99)
Potassium: 3.2 mmol/L — ABNORMAL LOW (ref 3.5–5.1)
Sodium: 128 mmol/L — ABNORMAL LOW (ref 135–145)

## 2020-06-13 LAB — TROPONIN I (HIGH SENSITIVITY): Troponin I (High Sensitivity): 8 ng/L (ref <18)

## 2020-06-13 NOTE — ED Triage Notes (Signed)
Pt arrives POV with c/o mid sternal CP radiating to right side with burning into upper abdomen that started at 1100 today. Pt denies N/V, endorses cough, sob "for awhile".

## 2020-06-24 ENCOUNTER — Emergency Department (HOSPITAL_BASED_OUTPATIENT_CLINIC_OR_DEPARTMENT_OTHER)
Admission: EM | Admit: 2020-06-24 | Discharge: 2020-06-24 | Disposition: A | Discharge status: Home / Self Care | Payer: BLUE CROSS/BLUE SHIELD | Attending: Emergency Medicine | Admitting: Emergency Medicine

## 2020-06-24 ENCOUNTER — Other Ambulatory Visit: Payer: Self-pay

## 2020-06-24 ENCOUNTER — Encounter (HOSPITAL_BASED_OUTPATIENT_CLINIC_OR_DEPARTMENT_OTHER): Payer: Self-pay | Admitting: Emergency Medicine

## 2020-06-24 ENCOUNTER — Emergency Department (HOSPITAL_BASED_OUTPATIENT_CLINIC_OR_DEPARTMENT_OTHER): Payer: BLUE CROSS/BLUE SHIELD

## 2020-06-24 DIAGNOSIS — R519 Headache, unspecified: Secondary | ICD-10-CM | POA: Diagnosis not present

## 2020-06-24 DIAGNOSIS — R002 Palpitations: Secondary | ICD-10-CM | POA: Insufficient documentation

## 2020-06-24 DIAGNOSIS — R000 Tachycardia, unspecified: Secondary | ICD-10-CM | POA: Diagnosis not present

## 2020-06-24 DIAGNOSIS — I10 Essential (primary) hypertension: Secondary | ICD-10-CM | POA: Diagnosis not present

## 2020-06-24 DIAGNOSIS — R05 Cough: Secondary | ICD-10-CM | POA: Diagnosis not present

## 2020-06-24 DIAGNOSIS — R079 Chest pain, unspecified: Secondary | ICD-10-CM | POA: Insufficient documentation

## 2020-06-24 DIAGNOSIS — E871 Hypo-osmolality and hyponatremia: Secondary | ICD-10-CM | POA: Diagnosis not present

## 2020-06-24 DIAGNOSIS — Z79899 Other long term (current) drug therapy: Secondary | ICD-10-CM | POA: Insufficient documentation

## 2020-06-24 DIAGNOSIS — R600 Localized edema: Secondary | ICD-10-CM | POA: Diagnosis not present

## 2020-06-24 DIAGNOSIS — Z85820 Personal history of malignant melanoma of skin: Secondary | ICD-10-CM | POA: Insufficient documentation

## 2020-06-24 DIAGNOSIS — R609 Edema, unspecified: Secondary | ICD-10-CM

## 2020-06-24 LAB — BASIC METABOLIC PANEL
Anion gap: 9 (ref 5–15)
BUN: 15 mg/dL (ref 8–23)
CO2: 23 mmol/L (ref 22–32)
Calcium: 8.5 mg/dL — ABNORMAL LOW (ref 8.9–10.3)
Chloride: 97 mmol/L — ABNORMAL LOW (ref 98–111)
Creatinine, Ser: 0.87 mg/dL (ref 0.61–1.24)
GFR calc Af Amer: >60 mL/min (ref >60)
GFR calc non Af Amer: >60 mL/min (ref >60)
Glucose, Bld: 103 mg/dL — ABNORMAL HIGH (ref 70–99)
Potassium: 3.4 mmol/L — ABNORMAL LOW (ref 3.5–5.1)
Sodium: 129 mmol/L — ABNORMAL LOW (ref 135–145)

## 2020-06-24 LAB — CBC
HCT: 38.5 % — ABNORMAL LOW (ref 39.0–52.0)
Hemoglobin: 12.1 g/dL — ABNORMAL LOW (ref 13.0–17.0)
MCH: 25.5 pg — ABNORMAL LOW (ref 26.0–34.0)
MCHC: 31.4 g/dL (ref 30.0–36.0)
MCV: 81.1 fL (ref 80.0–100.0)
Platelets: 319 10*3/uL (ref 150–400)
RBC: 4.75 MIL/uL (ref 4.22–5.81)
RDW: 14.8 % (ref 11.5–15.5)
WBC: 8.3 10*3/uL (ref 4.0–10.5)
nRBC: 0 % (ref 0.0–0.2)

## 2020-06-24 LAB — TROPONIN I (HIGH SENSITIVITY)
Troponin I (High Sensitivity): 6 ng/L (ref <18)
Troponin I (High Sensitivity): 6 ng/L (ref <18)

## 2020-06-24 NOTE — ED Provider Notes (Signed)
[facility] EMERGENCY DEPARTMENT Provider Note   CSN: [id number] Arrival date & time: 06/24/20  1209     History Chief Complaint  Patient presents with  . Chest Pain  . Leg Swelling    Shane Sawyer is a 64 y.o. male.  Patient presents for evaluation of lower edema edema and feeling of neck swelling, worsened over the last several days. He is also reporting intermittent chest tightness and palpitations, and a persistent cough. He was seen by pulmonology on 06/05/20 with pulmonary etiology unlikely.and He has been evaluated by cardiology, and is scheduled for stress test on Monday. Known history of hyponatremia, was on sodium supplementation but essentially ceased taking the supplement. He is also reporting mild, persistent headache for several days, along with intermittent nasal congestion and sinus pressure. He is taking an antihistamine. He is on prilosec for GERD.   The history is provided by the patient and medical records.  Chest Pain Pain location:  Substernal area Pain quality: pressure and sharp   Pain severity:  Moderate Onset quality:  Gradual Timing:  Sporadic Progression:  Waxing and waning Chronicity:  Recurrent Associated symptoms: cough, headache and palpitations   Risk factors: high cholesterol        Past Medical History:  Diagnosis Date  . Cancer ([place])    skin, left temple  . Erectile dysfunction   . HBP (high blood pressure)   . History of echocardiogram    Echo 3/18: EF 55-60, no RWMA, PASP 27  . History of nuclear stress test    Myoview 3/18: EF 50, inf thinning, no ischemia, normal wall motion; Low Risk    Patient Active Problem List   Diagnosis Date Noted  . Peripheral polyneuropathy 10/20/2017  . Paresthesia 09/25/2017  . Rectal bleeding 09/21/2015  . Change in bowel habits 09/21/2015  . Special screening for malignant neoplasms, colon 09/21/2015  . Tremor 09/10/2013  . Unspecified essential hypertension 08/29/2013    Past  Surgical History:  Procedure Laterality Date  . NOSE SURGERY     times 2  . SKIN CANCER EXCISION  12/2016       Family History  Problem Relation Age of Onset  . Bladder Cancer Father   . Heart failure Mother   . Valvular heart disease Mother   . Colon cancer Neg Hx   . Heart attack Neg Hx     Social History   Tobacco Use  . Smoking status: Never Smoker  . Smokeless tobacco: Never Used  Vaping Use  . Vaping Use: Never used  Substance Use Topics  . Alcohol use: Yes    Comment: daily  . Drug use: No    Home Medications Prior to Admission medications   Medication Sig Start Date End Date Taking? Authorizing Provider  AMBULATORY NON FORMULARY MEDICATION Emergency B complex Takes one tablet daily    [provider]  AMBULATORY NON FORMULARY MEDICATION L-arginine Take one tablet daily    [provider]  APPLE CIDER VINEGAR PO Take 1 Dose by mouth daily.    [provider]  ciprofloxacin (CIPRO) 500 MG tablet Take 1 tablet (500 mg total) by mouth 2 (two) times daily. 12/02/19   Venter, Margaux, PA-C  COD LIVER OIL PO Take 1 Dose by mouth daily.    [provider]  Coenzyme Q10 (CO Q-10 PO) Take 1 tablet by mouth daily.    [provider]  FOLIC ACID PO Take 1 tablet by mouth daily.    [provider]  GARLIC PO Take 1 tablet by mouth daily.    [provider]  losartan-hydrochlorothiazide (HYZAAR) 100-25 MG tablet Take 1 tablet by mouth daily. 06/29/17   [name], MD  MILK THISTLE PO Take 1 tablet by mouth daily.    [provider]  Niacin (VITAMIN B-3 PO) Take 1 tablet by mouth daily. Reported on 05/10/2016    [provider]  Omega-3 Fatty Acids (FISH OIL PO) Take 1 tablet by mouth daily.    [provider]  omeprazole (PRILOSEC) 20 MG capsule Take 20 mg by mouth daily.    [provider]  Plant Sterols and Stanols (CHOLEST OFF PO) Take 1 tablet by mouth daily.     [provider]  tadalafil (CIALIS) 20 MG tablet Take 20 mg by mouth daily as needed for erectile dysfunction.    [provider]  thiamine (VITAMIN B-1) 100 MG tablet Take 100 mg by mouth daily.    [provider]    Allergies    Ivp dye [iodinated diagnostic agents], Penicillins, and Sulfur  Review of Systems   Review of Systems  Respiratory: Positive for cough.   Cardiovascular: Positive for chest pain and palpitations.  Neurological: Positive for headaches.  All other systems reviewed and are negative.   Physical Exam Updated Vital Signs BP (!) 165/82   Pulse (!) 105   Resp 18   SpO2 97%   Physical Exam Vitals and nursing note reviewed.  Constitutional:      General: He is not in acute distress.    Appearance: He is well-developed. He is not ill-appearing.  Neck:     Vascular: No JVD.  Cardiovascular:     Rate and Rhythm: Tachycardia present.  Extrasystoles are present.    Heart sounds: Murmur heard.   Pulmonary:     Breath sounds: Normal breath sounds.  Chest:     Chest wall: No tenderness.  Abdominal:     Palpations: Abdomen is soft.  Musculoskeletal:        General: Normal range of motion.     Cervical back: Normal range of motion.     Right lower leg: No edema.     Left lower leg: No edema.  Skin:    General: Skin is warm and dry.  Neurological:     General: No focal deficit present.  Psychiatric:        Mood and Affect: Mood normal.     ED Results / Procedures / Treatments   Labs (all labs ordered are listed, but only abnormal results are displayed) Labs Reviewed  BASIC METABOLIC PANEL - Abnormal; Notable for the following components:      Result Value   Sodium 129 (*)    Potassium 3.4 (*)    Chloride 97 (*)    Glucose, Bld 103 (*)    Calcium 8.5 (*)    All other components within normal limits  CBC - Abnormal; Notable for the following components:   Hemoglobin 12.1 (*)    HCT 38.5 (*)    MCH 25.5 (*)    All  other components within normal limits  TROPONIN I (HIGH SENSITIVITY)  TROPONIN I (HIGH SENSITIVITY)    EKG EKG Interpretation  Date/Time:  Wednesday June 24 2020 12:14:39 EDT Ventricular Rate:  105 PR Interval:  212 QRS Duration: 94 QT Interval:  320 QTC Calculation: 422 R Axis:   62 Text Interpretation: Sinus tachycardia with 1st degree A-V block Otherwise normal ECG  No significant change since last tracing Confirmed by Deno Etienne [phone]) on 06/24/2020 1:00:37 PM   Radiology DG Chest 2 View  Result Date: 06/24/2020 CLINICAL DATA:  Chest pain for awhile, swelling and tingling in legs last night EXAM: CHEST - 2 VIEW COMPARISON:  06/13/2020 FINDINGS: Normal heart size, mediastinal contours, and pulmonary vascularity. Lungs clear. No infiltrate, pleural effusion or pneumothorax. Osseous structures unremarkable. IMPRESSION: No acute abnormalities. Electronically Signed   By: Lavonia Dana M.D.   On: 06/24/2020 12:37    Procedures Procedures (including critical care time)  Medications Ordered in ED Medications - No data to display  ED Course  I have reviewed the triage vital signs and the nursing notes.  Pertinent labs & imaging results that were available during my care of the patient were reviewed by me and considered in my medical decision making (see chart for details).    MDM Rules/Calculators/A&P                          Patient is to be discharged with recommendation to follow up with PCP in regards to today's hospital visit. Chest pain is not likely of cardiac or pulmonary etiology d/t presentation, perc negative, VSS, no tracheal deviation, no JVD or new murmur, RRR, breath sounds equal bilaterally, EKG without acute abnormalities, negative troponin, and negative CXR. Patient is scheduled for stress testing next week. Pt has history of hyponatremia but has ceased sodium supplementation. Mild intermittent edema of lower extremities. Case has been discussed with Dr. Maryan Rued  who agrees with the above plan to discharge.   Patient to resume salt tablet supplementation and will follow-up with his PCP. Final Clinical Impression(s) / ED Diagnoses Final diagnoses:  Peripheral edema  Hyponatremia    Rx / DC Orders ED Discharge Orders    None       Etta Quill, NP 06/25/20 [place], [place], DO 06/25/20 [phone]

## 2020-06-24 NOTE — ED Triage Notes (Signed)
Pt here with midline chest pain and bilateral leg swelling intermittently for several weeks.

## 2020-06-24 NOTE — Discharge Instructions (Signed)
Restart your salt supplement, twice a day initially. Continue with your follow-up cardiology appointments as scheduled. Please refer to the attached instructions.

## 2020-10-31 ENCOUNTER — Other Ambulatory Visit: Payer: Self-pay

## 2020-10-31 ENCOUNTER — Emergency Department (HOSPITAL_BASED_OUTPATIENT_CLINIC_OR_DEPARTMENT_OTHER): Payer: BLUE CROSS/BLUE SHIELD

## 2020-10-31 ENCOUNTER — Emergency Department (HOSPITAL_BASED_OUTPATIENT_CLINIC_OR_DEPARTMENT_OTHER)
Admission: EM | Admit: 2020-10-31 | Discharge: 2020-11-01 | Disposition: A | Discharge status: Home / Self Care | Payer: BLUE CROSS/BLUE SHIELD | Attending: Emergency Medicine | Admitting: Emergency Medicine

## 2020-10-31 ENCOUNTER — Encounter (HOSPITAL_BASED_OUTPATIENT_CLINIC_OR_DEPARTMENT_OTHER): Payer: Self-pay | Admitting: Emergency Medicine

## 2020-10-31 DIAGNOSIS — Z5321 Procedure and treatment not carried out due to patient leaving prior to being seen by health care provider: Secondary | ICD-10-CM | POA: Insufficient documentation

## 2020-10-31 DIAGNOSIS — M79641 Pain in right hand: Secondary | ICD-10-CM | POA: Diagnosis not present

## 2020-10-31 DIAGNOSIS — W109XXA Fall (on) (from) unspecified stairs and steps, initial encounter: Secondary | ICD-10-CM | POA: Diagnosis not present

## 2020-10-31 DIAGNOSIS — M25561 Pain in right knee: Secondary | ICD-10-CM | POA: Insufficient documentation

## 2020-10-31 DIAGNOSIS — R519 Headache, unspecified: Secondary | ICD-10-CM | POA: Insufficient documentation

## 2020-10-31 NOTE — ED Notes (Signed)
Called to update vitals.  Did not answer X 3.  Did make comments the previous round of vitals that he wasn't going to wait here all night.  Encouraged to wait to be seen.

## 2020-10-31 NOTE — ED Triage Notes (Signed)
Reports falling down 14 steps landing on a concrete at the bottom.  Reports he thinks he passed out for a few minutes.  This happened at 1am.  C/o pain in face, right hand, right knee.

## 2020-11-10 ENCOUNTER — Other Ambulatory Visit: Payer: Self-pay

## 2020-11-10 ENCOUNTER — Emergency Department (HOSPITAL_COMMUNITY): Payer: BLUE CROSS/BLUE SHIELD

## 2020-11-10 ENCOUNTER — Encounter (HOSPITAL_COMMUNITY): Payer: Self-pay | Admitting: Neurological Surgery

## 2020-11-10 ENCOUNTER — Inpatient Hospital Stay (HOSPITAL_COMMUNITY)
Admission: EM | Admit: 2020-11-10 | Discharge: 2020-11-11 | DRG: 084 | Disposition: A | Discharge status: Home / Self Care | Payer: BLUE CROSS/BLUE SHIELD | Attending: Neurological Surgery | Admitting: Neurological Surgery

## 2020-11-10 DIAGNOSIS — Z880 Allergy status to penicillin: Secondary | ICD-10-CM

## 2020-11-10 DIAGNOSIS — Z85828 Personal history of other malignant neoplasm of skin: Secondary | ICD-10-CM

## 2020-11-10 DIAGNOSIS — W2209XA Striking against other stationary object, initial encounter: Secondary | ICD-10-CM | POA: Diagnosis present

## 2020-11-10 DIAGNOSIS — N529 Male erectile dysfunction, unspecified: Secondary | ICD-10-CM | POA: Diagnosis present

## 2020-11-10 DIAGNOSIS — Z20822 Contact with and (suspected) exposure to covid-19: Secondary | ICD-10-CM | POA: Diagnosis present

## 2020-11-10 DIAGNOSIS — Z8052 Family history of malignant neoplasm of bladder: Secondary | ICD-10-CM

## 2020-11-10 DIAGNOSIS — Z8249 Family history of ischemic heart disease and other diseases of the circulatory system: Secondary | ICD-10-CM

## 2020-11-10 DIAGNOSIS — S065X9A Traumatic subdural hemorrhage with loss of consciousness of unspecified duration, initial encounter: Secondary | ICD-10-CM | POA: Diagnosis not present

## 2020-11-10 DIAGNOSIS — Z882 Allergy status to sulfonamides status: Secondary | ICD-10-CM

## 2020-11-10 DIAGNOSIS — Z91041 Radiographic dye allergy status: Secondary | ICD-10-CM | POA: Diagnosis not present

## 2020-11-10 DIAGNOSIS — I10 Essential (primary) hypertension: Secondary | ICD-10-CM | POA: Diagnosis present

## 2020-11-10 DIAGNOSIS — I6200 Nontraumatic subdural hemorrhage, unspecified: Secondary | ICD-10-CM

## 2020-11-10 DIAGNOSIS — W109XXA Fall (on) (from) unspecified stairs and steps, initial encounter: Secondary | ICD-10-CM | POA: Diagnosis present

## 2020-11-10 DIAGNOSIS — S065XAA Traumatic subdural hemorrhage with loss of consciousness status unknown, initial encounter: Secondary | ICD-10-CM | POA: Diagnosis present

## 2020-11-10 DIAGNOSIS — R40241 Glasgow coma scale score 13-15, unspecified time: Secondary | ICD-10-CM | POA: Diagnosis present

## 2020-11-10 DIAGNOSIS — Z7982 Long term (current) use of aspirin: Secondary | ICD-10-CM

## 2020-11-10 DIAGNOSIS — I619 Nontraumatic intracerebral hemorrhage, unspecified: Secondary | ICD-10-CM

## 2020-11-10 DIAGNOSIS — R519 Headache, unspecified: Secondary | ICD-10-CM | POA: Diagnosis not present

## 2020-11-10 LAB — COMPREHENSIVE METABOLIC PANEL
ALT: 33 U/L (ref 0–44)
AST: 27 U/L (ref 15–41)
Albumin: 4 g/dL (ref 3.5–5.0)
Alkaline Phosphatase: 30 U/L — ABNORMAL LOW (ref 38–126)
Anion gap: 8 (ref 5–15)
BUN: 17 mg/dL (ref 8–23)
CO2: 25 mmol/L (ref 22–32)
Calcium: 9.1 mg/dL (ref 8.9–10.3)
Chloride: 103 mmol/L (ref 98–111)
Creatinine, Ser: 1.13 mg/dL (ref 0.61–1.24)
GFR, Estimated: >60 mL/min (ref >60)
Glucose, Bld: 126 mg/dL — ABNORMAL HIGH (ref 70–99)
Potassium: 4.8 mmol/L (ref 3.5–5.1)
Sodium: 136 mmol/L (ref 135–145)
Total Bilirubin: 0.5 mg/dL (ref 0.3–1.2)
Total Protein: 7.2 g/dL (ref 6.5–8.1)

## 2020-11-10 LAB — CBG MONITORING, ED: Glucose-Capillary: 114 mg/dL — ABNORMAL HIGH (ref 70–99)

## 2020-11-10 LAB — I-STAT CHEM 8, ED
BUN: 21 mg/dL (ref 8–23)
Calcium, Ion: 1.24 mmol/L (ref 1.15–1.40)
Chloride: 101 mmol/L (ref 98–111)
Creatinine, Ser: 1.1 mg/dL (ref 0.61–1.24)
Glucose, Bld: 125 mg/dL — ABNORMAL HIGH (ref 70–99)
HCT: 53 % — ABNORMAL HIGH (ref 39.0–52.0)
Hemoglobin: 18 g/dL — ABNORMAL HIGH (ref 13.0–17.0)
Potassium: 4.8 mmol/L (ref 3.5–5.1)
Sodium: 139 mmol/L (ref 135–145)
TCO2: 27 mmol/L (ref 22–32)

## 2020-11-10 LAB — DIFFERENTIAL
Abs Immature Granulocytes: 0.09 10*3/uL — ABNORMAL HIGH (ref 0.00–0.07)
Basophils Absolute: 0.1 10*3/uL (ref 0.0–0.1)
Basophils Relative: 1 %
Eosinophils Absolute: 0.1 10*3/uL (ref 0.0–0.5)
Eosinophils Relative: 1 %
Immature Granulocytes: 1 %
Lymphocytes Relative: 15 %
Lymphs Abs: 1.5 10*3/uL (ref 0.7–4.0)
Monocytes Absolute: 0.9 10*3/uL (ref 0.1–1.0)
Monocytes Relative: 9 %
Neutro Abs: 7.2 10*3/uL (ref 1.7–7.7)
Neutrophils Relative %: 73 %

## 2020-11-10 LAB — CBC
HCT: 53.2 % — ABNORMAL HIGH (ref 39.0–52.0)
Hemoglobin: 16.9 g/dL (ref 13.0–17.0)
MCH: 29.8 pg (ref 26.0–34.0)
MCHC: 31.8 g/dL (ref 30.0–36.0)
MCV: 93.8 fL (ref 80.0–100.0)
Platelets: 215 10*3/uL (ref 150–400)
RBC: 5.67 MIL/uL (ref 4.22–5.81)
RDW: 17.7 % — ABNORMAL HIGH (ref 11.5–15.5)
WBC: 9.9 10*3/uL (ref 4.0–10.5)
nRBC: 0 % (ref 0.0–0.2)

## 2020-11-10 LAB — HIV ANTIBODY (ROUTINE TESTING W REFLEX): HIV Screen 4th Generation wRfx: NONREACTIVE

## 2020-11-10 LAB — APTT: aPTT: 27 seconds (ref 24–36)

## 2020-11-10 LAB — PROTIME-INR
INR: 1 (ref 0.8–1.2)
Prothrombin Time: 12.9 seconds (ref 11.4–15.2)

## 2020-11-10 MED ORDER — ONDANSETRON HCL 4 MG/2ML IJ SOLN: 4.0000 mg | Freq: Four times a day (QID) | INTRAMUSCULAR | Status: DC | PRN

## 2020-11-10 MED ORDER — ACETAMINOPHEN 325 MG PO TABS: 650.0000 mg | ORAL_TABLET | Freq: Four times a day (QID) | ORAL | Status: DC | PRN

## 2020-11-10 MED ORDER — LEVETIRACETAM IN NACL 1000 MG/100ML IV SOLN
1000.0000 mg | Freq: Once | INTRAVENOUS | Status: AC
  Administered 2020-11-10: 1000 mg via INTRAVENOUS
  Filled 2020-11-10: qty 100

## 2020-11-10 MED ORDER — METHOCARBAMOL 1000 MG/10ML IJ SOLN
500.0000 mg | Freq: Four times a day (QID) | INTRAVENOUS | Status: DC | PRN
  Administered 2020-11-10: 500 mg via INTRAVENOUS
  Filled 2020-11-10: qty 5

## 2020-11-10 MED ORDER — HYDROCODONE-ACETAMINOPHEN 5-325 MG PO TABS
1.0000 | ORAL_TABLET | ORAL | Status: DC | PRN
  Administered 2020-11-10 – 2020-11-11 (×3): 1 via ORAL
  Filled 2020-11-10 (×3): qty 1

## 2020-11-10 MED ORDER — MORPHINE SULFATE (PF) 4 MG/ML IV SOLN
4.0000 mg | Freq: Once | INTRAVENOUS | Status: AC
  Administered 2020-11-10: 4 mg via INTRAVENOUS
  Filled 2020-11-10: qty 1

## 2020-11-10 MED ORDER — ACETAMINOPHEN 650 MG RE SUPP: 650.0000 mg | Freq: Four times a day (QID) | RECTAL | Status: DC | PRN

## 2020-11-10 MED ORDER — SODIUM CHLORIDE 0.9 % IV SOLN: INTRAVENOUS | Status: DC

## 2020-11-10 MED ORDER — FENTANYL CITRATE (PF) 100 MCG/2ML IJ SOLN
25.0000 ug | INTRAMUSCULAR | Status: DC | PRN
  Administered 2020-11-10 – 2020-11-11 (×2): 25 ug via INTRAVENOUS
  Filled 2020-11-10 (×2): qty 2

## 2020-11-10 MED ORDER — MORPHINE SULFATE (PF) 2 MG/ML IV SOLN
2.0000 mg | INTRAVENOUS | Status: DC | PRN
  Filled 2020-11-10: qty 1

## 2020-11-10 MED ORDER — SENNOSIDES-DOCUSATE SODIUM 8.6-50 MG PO TABS: 1.0000 | ORAL_TABLET | Freq: Every evening | ORAL | Status: DC | PRN

## 2020-11-10 MED ORDER — LEVETIRACETAM IN NACL 500 MG/100ML IV SOLN
500.0000 mg | Freq: Two times a day (BID) | INTRAVENOUS | Status: DC
  Administered 2020-11-11: 500 mg via INTRAVENOUS
  Filled 2020-11-10 (×2): qty 100

## 2020-11-10 MED ORDER — SODIUM CHLORIDE 0.9% FLUSH
3.0000 mL | Freq: Once | INTRAVENOUS | Status: AC
Start: 2020-11-10 — End: 2020-11-10
  Administered 2020-11-10: 3 mL via INTRAVENOUS

## 2020-11-10 MED ORDER — CHLORHEXIDINE GLUCONATE CLOTH 2 % EX PADS: 6.0000 | MEDICATED_PAD | Freq: Every day | CUTANEOUS | Status: DC

## 2020-11-10 MED ORDER — ONDANSETRON HCL 4 MG PO TABS
4.0000 mg | ORAL_TABLET | Freq: Four times a day (QID) | ORAL | Status: DC | PRN
  Administered 2020-11-10: 4 mg via ORAL
  Filled 2020-11-10: qty 1

## 2020-11-10 MED ORDER — LABETALOL HCL 5 MG/ML IV SOLN
10.0000 mg | INTRAVENOUS | Status: DC | PRN
  Filled 2020-11-10: qty 4

## 2020-11-10 MED ORDER — CLEVIDIPINE BUTYRATE 0.5 MG/ML IV EMUL
0.0000 mg/h | INTRAVENOUS | Status: DC
  Administered 2020-11-10 (×2): 11 mg/h via INTRAVENOUS
  Administered 2020-11-10: 1 mg/h via INTRAVENOUS
  Administered 2020-11-10 (×3): 11 mg/h via INTRAVENOUS
  Filled 2020-11-10 (×6): qty 50

## 2020-11-10 NOTE — ED Provider Notes (Signed)
[facility] EMERGENCY DEPARTMENT Provider Note   CSN: [id number] Arrival date & time: 11/10/20  1207     History Chief Complaint  Patient presents with  . Headache    Shane Sawyer is a 65 y.o. male.  HPI   Pt had sudden onset of headache today while walking. It was a stabbing in the temples.  He suddenly felt weak and numb in his legs.  THose symptoms have resolved but he continues to have a severe headache. Patient states he did fall about a week ago but those symptoms pretty much had resolved until this acute episode today. Patient denies any fevers or chills. No cough. No vomiting or diarrhea.  Past Medical History:  Diagnosis Date  . Cancer ([place])    skin, left temple  . Erectile dysfunction   . HBP (high blood pressure)   . History of echocardiogram    Echo 3/18: EF 55-60, no RWMA, PASP 27  . History of nuclear stress test    Myoview 3/18: EF 50, inf thinning, no ischemia, normal wall motion; Low Risk    Patient Active Problem List   Diagnosis Date Noted  . Subdural hematoma ([place]) 11/10/2020  . Peripheral polyneuropathy 10/20/2017  . Paresthesia 09/25/2017  . Rectal bleeding 09/21/2015  . Change in bowel habits 09/21/2015  . Special screening for malignant neoplasms, colon 09/21/2015  . Tremor 09/10/2013  . Unspecified essential hypertension 08/29/2013    Past Surgical History:  Procedure Laterality Date  . NOSE SURGERY     times 2  . SKIN CANCER EXCISION  12/2016       Family History  Problem Relation Age of Onset  . Bladder Cancer Father   . Heart failure Mother   . Valvular heart disease Mother   . Colon cancer Neg Hx   . Heart attack Neg Hx     Social History   Tobacco Use  . Smoking status: Never Smoker  . Smokeless tobacco: Never Used  Vaping Use  . Vaping Use: Never used  Substance Use Topics  . Alcohol use: Yes    Comment: daily  . Drug use: No    Home Medications Prior to Admission medications   Medication Sig  Start Date End Date Taking? Authorizing Provider  AMBULATORY NON FORMULARY MEDICATION Emergency B complex Takes one tablet daily    [provider]  AMBULATORY NON FORMULARY MEDICATION L-arginine Take one tablet daily    [provider]  APPLE CIDER VINEGAR PO Take 1 Dose by mouth daily.    [provider]  ciprofloxacin (CIPRO) 500 MG tablet Take 1 tablet (500 mg total) by mouth 2 (two) times daily. 12/02/19   Venter, Margaux, PA-C  COD LIVER OIL PO Take 1 Dose by mouth daily.    [provider]  Coenzyme Q10 (CO Q-10 PO) Take 1 tablet by mouth daily.    [provider]  FOLIC ACID PO Take 1 tablet by mouth daily.    [provider]  GARLIC PO Take 1 tablet by mouth daily.    [provider]  losartan-hydrochlorothiazide (HYZAAR) 100-25 MG tablet Take 1 tablet by mouth daily. 06/29/17   [name], MD  MILK THISTLE PO Take 1 tablet by mouth daily.    [provider]  Niacin (VITAMIN B-3 PO) Take 1 tablet by mouth daily. Reported on 05/10/2016    [provider]  Omega-3 Fatty Acids (FISH OIL PO) Take 1 tablet by mouth daily.  [provider]  omeprazole (PRILOSEC) 20 MG capsule Take 20 mg by mouth daily.    [provider]  Plant Sterols and Stanols (CHOLEST OFF PO) Take 1 tablet by mouth daily.    [provider]  tadalafil (CIALIS) 20 MG tablet Take 20 mg by mouth daily as needed for erectile dysfunction.    [provider]  thiamine (VITAMIN B-1) 100 MG tablet Take 100 mg by mouth daily.    [provider]    Allergies    Ivp dye [iodinated diagnostic agents], Penicillins, and Sulfur  Review of Systems   Review of Systems  All other systems reviewed and are negative.   Physical Exam Updated Vital Signs BP (!) 167/88   Pulse 78   Temp 98 F (36.7 C) (Oral)   Resp 16   SpO2 99%   Physical Exam Vitals and nursing note reviewed.  Constitutional:       General: He is not in acute distress.    Appearance: He is well-developed and well-nourished.  HENT:     Head: Normocephalic and atraumatic.     Right Ear: External ear normal.     Left Ear: External ear normal.  Eyes:     General: No scleral icterus.       Right eye: No discharge.        Left eye: No discharge.     Conjunctiva/sclera: Conjunctivae normal.  Neck:     Trachea: No tracheal deviation.  Cardiovascular:     Rate and Rhythm: Normal rate and regular rhythm.     Pulses: Intact distal pulses.  Pulmonary:     Effort: Pulmonary effort is normal. No respiratory distress.     Breath sounds: Normal breath sounds. No stridor. No wheezing or rales.  Abdominal:     General: Bowel sounds are normal. There is no distension.     Palpations: Abdomen is soft.     Tenderness: There is no abdominal tenderness. There is no guarding or rebound.  Musculoskeletal:        General: No tenderness or edema.     Cervical back: Neck supple.  Skin:    General: Skin is warm and dry.     Findings: No rash.  Neurological:     Mental Status: He is alert.     Cranial Nerves: No cranial nerve deficit (no facial droop, extraocular movements intact, no slurred speech).     Sensory: No sensory deficit.     Motor: No abnormal muscle tone or seizure activity.     Coordination: Coordination normal.     Deep Tendon Reflexes: Strength normal.  Psychiatric:        Mood and Affect: Mood and affect normal.     ED Results / Procedures / Treatments   Labs (all labs ordered are listed, but only abnormal results are displayed) Labs Reviewed  CBC - Abnormal; Notable for the following components:      Result Value   HCT 53.2 (*)    RDW 17.7 (*)    All other components within normal limits  DIFFERENTIAL - Abnormal; Notable for the following components:   Abs Immature Granulocytes 0.09 (*)    All other components within normal limits  I-STAT CHEM 8, ED - Abnormal; Notable for the following components:    Glucose, Bld 125 (*)    Hemoglobin 18.0 (*)    HCT 53.0 (*)    All other components within normal limits  PROTIME-INR  APTT  COMPREHENSIVE METABOLIC  PANEL  HIV ANTIBODY (ROUTINE TESTING W REFLEX)  CBG MONITORING, ED    EKG EKG Interpretation  Date/Time:  Tuesday November 10 2020 12:10:47 EST Ventricular Rate:  80 PR Interval:  166 QRS Duration: 100 QT Interval:  338 QTC Calculation: 389 R Axis:   79 Text Interpretation: Normal sinus rhythm with sinus arrhythmia Normal ECG Confirmed by Madalyn Rob [phone]) on 11/10/2020 12:38:29 PM   Radiology CT HEAD WO CONTRAST  Result Date: 11/10/2020 CLINICAL DATA:  Recent fall down multiple stairs, headache EXAM: CT HEAD WITHOUT CONTRAST TECHNIQUE: Contiguous axial images were obtained from the base of the skull through the vertex without intravenous contrast. COMPARISON:  None. FINDINGS: Brain: Hyperdense subdural hemorrhage is present along the left cerebral convexity measuring up to 8 mm in thickness. Additional subdural hemorrhage is present along the falx primarily anteriorly. There is a 2.7 x 1.3 x 1.6 cm area of parenchymal hemorrhage anteroinferior left frontal lobe with mild adjacent edema. This likely reflects a parenchymal contusion. There is mild mass effect with right for midline shift measuring 6 mm. No hydrocephalus. Gray-white differentiation is preserved. Vascular: There is atherosclerotic calcification at the skull base. Skull: Calvarium is unremarkable. Sinuses/Orbits: No acute finding. Other: None. IMPRESSION: Acute subdural hemorrhage along the left cerebral convexity and falx. Mild mass effect including subcentimeter rightward midline shift. No hydrocephalus. Left frontal parenchymal contusion with mild edema. These results were called by telephone at the time of interpretation on 11/10/2020 at 1:04 pm to provider Suella Broad, who verbally acknowledged these results. Electronically Signed   By: Macy Mis M.D.   On:  11/10/2020 13:08    Procedures .Critical Care Performed by: Dorie Rank, MD Authorized by: Dorie Rank, MD   Critical care provider statement:    Critical care time (minutes):  30   Critical care was time spent personally by me on the following activities:  Discussions with consultants, evaluation of patient's response to treatment, examination of patient, ordering and performing treatments and interventions, ordering and review of laboratory studies, ordering and review of radiographic studies, pulse oximetry, re-evaluation of patient's condition, obtaining history from patient or surrogate and review of old charts   (including critical care time)  Medications Ordered in ED Medications  clevidipine (CLEVIPREX) infusion 0.5 mg/mL (3 mg/hr Intravenous Rate/Dose Change 11/10/20 1328)  0.9 %  sodium chloride infusion (has no administration in time range)  acetaminophen (TYLENOL) tablet 650 mg (has no administration in time range)    Or  acetaminophen (TYLENOL) suppository 650 mg (has no administration in time range)  HYDROcodone-acetaminophen (NORCO/VICODIN) 5-325 MG per tablet 1-2 tablet (has no administration in time range)  morphine 2 MG/ML injection 2 mg (has no administration in time range)  methocarbamol (ROBAXIN) 500 mg in dextrose 5 % 50 mL IVPB (has no administration in time range)  senna-docusate (Senokot-S) tablet 1 tablet (has no administration in time range)  ondansetron (ZOFRAN) tablet 4 mg (has no administration in time range)    Or  ondansetron (ZOFRAN) injection 4 mg (has no administration in time range)  labetalol (NORMODYNE) injection 10 mg (has no administration in time range)  levETIRAcetam (KEPPRA) IVPB 1000 mg/100 mL premix (has no administration in time range)  levETIRAcetam (KEPPRA) IVPB 500 mg/100 mL premix (has no administration in time range)  morphine 4 MG/ML injection 4 mg (has no administration in time range)  sodium chloride flush (NS) 0.9 % injection 3 mL (3  mLs Intravenous Given 11/10/20 1318)    ED Course  I have  reviewed the triage vital signs and the nursing notes.  Pertinent labs & imaging results that were available during my care of the patient were reviewed by me and considered in my medical decision making (see chart for details).    MDM Rules/Calculators/A&P                          Patient presented to the ED with complaints of severe headache. Patient had laboratory tests and CT scan ordered at triage. Patient's head CT shows evidence of focal cerebral hemorrhage as well as subdural hematoma. Patient is GCS 15. No focal neurodeficits at this time. Patient is somewhat hypertensive. Cleviprex has been ordered to help control his blood pressure. Patient has also been given Deven no dose of morphine for pain. Neurosurgery was consulted. Dr. Reatha Armour will plan on admission for further treatment. Final Clinical Impression(s) / ED Diagnoses Final diagnoses:  Cerebral hemorrhage ([place])  Subdural hemorrhage ([facility])      Dorie Rank, MD 11/10/20 1332

## 2020-11-10 NOTE — ED Notes (Signed)
Report given to floor Merrilee Seashore, RN.

## 2020-11-10 NOTE — ED Triage Notes (Signed)
Pt walking and developed worst headache of his life, originating in temples and wrapping around into forehead. Weakness/paralysis in bilateral legs, resolving now, although he states they are still heavy and numb. Hypertensive >200. Had fall one week ago down 14 stairs and hit his head.

## 2020-11-10 NOTE — H&P (Signed)
Providing Compassionate, Quality Care - Together  NEUROSURGERY HISTORY & PHYSICAL   Shane Sawyer is an 65 y.o. male.   Chief Complaint: HA HPI: This is a 65 yo M with history of HTN, that had a fall NYE down 14 steps while helping his friend move, hit his head on concrete.  Had positive LOC for a few seconds.  Denies any seizure-like activity.  Had a severe headache that day but it significantly improved.  He came to the emergency department and could not wait for head CT scan therefore he left.  Since then he has been feeling fine until today he had a severe bitemporal headache and frontal headache that brought him to the emergency department.  CT revealed a subdural hematoma.  He denies any seizure activity.  He has been taking aspirin 81 mg daily for preventative measures.  Past Medical History:  Diagnosis Date  . Cancer ([place])    skin, left temple  . Erectile dysfunction   . HBP (high blood pressure)   . History of echocardiogram    Echo 3/18: EF 55-60, no RWMA, PASP 27  . History of nuclear stress test    Myoview 3/18: EF 50, inf thinning, no ischemia, normal wall motion; Low Risk    Past Surgical History:  Procedure Laterality Date  . NOSE SURGERY     times 2  . SKIN CANCER EXCISION  12/2016    Family History  Problem Relation Age of Onset  . Bladder Cancer Father   . Heart failure Mother   . Valvular heart disease Mother   . Colon cancer Neg Hx   . Heart attack Neg Hx    Social History:  reports that he has never smoked. He has never used smokeless tobacco. He reports current alcohol use. He reports that he does not use drugs.  Allergies:  Allergies  Allergen Reactions  . Penicillins Rash    Did it involve swelling of the face/tongue/throat, SOB, or low BP? no Did it involve sudden or severe rash/hives, skin peeling, or any reaction on the inside of your mouth or nose? no Did you need to seek medical attention at a hospital or doctor's office? No When did it  last happen?childhood If all above answers are "NO", may proceed with cephalosporin use.   Clementeen Hoof [Iodinated Diagnostic Agents]     Swelling in throat, SOB  . Sulfur Rash    (Not in a hospital admission)   Results for orders placed or performed during the hospital encounter of 11/10/20 (from the past 48 hour(s))  Protime-INR     Status: None   Collection Time: 11/10/20 12:12 PM  Result Value Ref Range   Prothrombin Time 12.9 11.4 - 15.2 seconds   INR 1.0 0.8 - 1.2    Comment: (NOTE) INR goal varies based on device and disease states. Performed at [facility], [place] [address] [place], [place] [address]   APTT     Status: None   Collection Time: 11/10/20 12:12 PM  Result Value Ref Range   aPTT 27 24 - 36 seconds    Comment: Performed at [facility] [address] [place] [address]  CBC     Status: Abnormal   Collection Time: 11/10/20 12:12 PM  Result Value Ref Range   WBC 9.9 4.0 - 10.5 K/uL   RBC 5.67 4.22 - 5.81 MIL/uL   Hemoglobin 16.9 13.0 - 17.0 g/dL   HCT 53.2 (H)  39.0 - 52.0 %   MCV 93.8 80.0 - 100.0 fL   MCH 29.8 26.0 - 34.0 pg   MCHC 31.8 30.0 - 36.0 g/dL   RDW 17.7 (H) 11.5 - 15.5 %   Platelets 215 150 - 400 K/uL   nRBC 0.0 0.0 - 0.2 %    Comment: Performed at [facility] [address] [place], [place] [address]  Differential     Status: Abnormal   Collection Time: 11/10/20 12:12 PM  Result Value Ref Range   Neutrophils Relative % 73 %   Neutro Abs 7.2 1.7 - 7.7 K/uL   Lymphocytes Relative 15 %   Lymphs Abs 1.5 0.7 - 4.0 K/uL   Monocytes Relative 9 %   Monocytes Absolute 0.9 0.1 - 1.0 K/uL   Eosinophils Relative 1 %   Eosinophils Absolute 0.1 0.0 - 0.5 K/uL   Basophils Relative 1 %   Basophils Absolute 0.1 0.0 - 0.1 K/uL   Immature Granulocytes 1 %   Abs Immature Granulocytes 0.09 (H) 0.00 - 0.07 K/uL    Comment: Performed at [facility] [address] [place], [place] [address]  Comprehensive metabolic  panel     Status: Abnormal   Collection Time: 11/10/20 12:12 PM  Result Value Ref Range   Sodium 136 135 - 145 mmol/L   Potassium 4.8 3.5 - 5.1 mmol/L   Chloride 103 98 - 111 mmol/L   CO2 25 22 - 32 mmol/L   Glucose, Bld 126 (H) 70 - 99 mg/dL    Comment: Glucose reference range applies only to samples taken after fasting for at least 8 hours.   BUN 17 8 - 23 mg/dL   Creatinine, Ser 1.13 0.61 - 1.24 mg/dL   Calcium 9.1 8.9 - 10.3 mg/dL   Total Protein 7.2 6.5 - 8.1 g/dL   Albumin 4.0 3.5 - 5.0 g/dL   AST 27 15 - 41 U/L   ALT 33 0 - 44 U/L   Alkaline Phosphatase 30 (L) 38 - 126 U/L   Total Bilirubin 0.5 0.3 - 1.2 mg/dL   GFR, Estimated >60 >60 mL/min    Comment: (NOTE) Calculated using the CKD-EPI Creatinine Equation (2021)    Anion gap 8 5 - 15    Comment: Performed at [facility] [address], [place], [place] [address]  I-stat chem 8, ED     Status: Abnormal   Collection Time: 11/10/20 12:25 PM  Result Value Ref Range   Sodium 139 135 - 145 mmol/L   Potassium 4.8 3.5 - 5.1 mmol/L   Chloride 101 98 - 111 mmol/L   BUN 21 8 - 23 mg/dL   Creatinine, Ser 1.10 0.61 - 1.24 mg/dL   Glucose, Bld 125 (H) 70 - 99 mg/dL    Comment: Glucose reference range applies only to samples taken after fasting for at least 8 hours.   Calcium, Ion 1.24 1.15 - 1.40 mmol/L   TCO2 27 22 - 32 mmol/L   Hemoglobin 18.0 (H) 13.0 - 17.0 g/dL   HCT 53.0 (H) 39.0 - 52.0 %  HIV Antibody (routine testing w rflx)     Status: None   Collection Time: 11/10/20  1:38 PM  Result Value Ref Range   HIV Screen 4th Generation wRfx Non Reactive Non Reactive    Comment: Performed at [facility] [address] [place], [place] [address]  CBG monitoring, ED     Status: Abnormal  Collection Time: 11/10/20  1:46 PM  Result Value Ref Range   Glucose-Capillary 114 (H) 70 - 99 mg/dL    Comment: Glucose reference range applies only to samples taken after fasting for at least 8 hours.   CT HEAD WO  CONTRAST  Result Date: 11/10/2020 CLINICAL DATA:  Recent fall down multiple stairs, headache EXAM: CT HEAD WITHOUT CONTRAST TECHNIQUE: Contiguous axial images were obtained from the base of the skull through the vertex without intravenous contrast. COMPARISON:  None. FINDINGS: Brain: Hyperdense subdural hemorrhage is present along the left cerebral convexity measuring up to 8 mm in thickness. Additional subdural hemorrhage is present along the falx primarily anteriorly. There is a 2.7 x 1.3 x 1.6 cm area of parenchymal hemorrhage anteroinferior left frontal lobe with mild adjacent edema. This likely reflects a parenchymal contusion. There is mild mass effect with right for midline shift measuring 6 mm. No hydrocephalus. Gray-white differentiation is preserved. Vascular: There is atherosclerotic calcification at the skull base. Skull: Calvarium is unremarkable. Sinuses/Orbits: No acute finding. Other: None. IMPRESSION: Acute subdural hemorrhage along the left cerebral convexity and falx. Mild mass effect including subcentimeter rightward midline shift. No hydrocephalus. Left frontal parenchymal contusion with mild edema. These results were called by telephone at the time of interpretation on 11/10/2020 at 1:04 pm to provider Suella Broad, who verbally acknowledged these results. Electronically Signed   By: Macy Mis M.D.   On: 11/10/2020 13:08    Review of Systems  Constitutional: Negative for chills and fever.  HENT: Negative for hearing loss and tinnitus.   Eyes: Negative for blurred vision and pain.  Cardiovascular: Negative for chest pain.  Gastrointestinal: Negative for abdominal pain, heartburn, nausea and vomiting.  Genitourinary: Negative for dysuria and urgency.  Neurological: Negative for tingling, sensory change, speech change, focal weakness, seizures and weakness.    Blood pressure 127/71, pulse 97, temperature 98 F (36.7 C), temperature source Oral, resp. rate 16, SpO2 95  %. Physical Exam  A&O x3 PERRLA EOMI Cranial nerves II through XII intact Slight right upper extremity drift 5/5 bilateral upper/lower extremities Sensory intact light touch Face symmetric Speech fluent and appropriate  Imaging: CT scan shows a left acute subdural hematoma along the convexity with 6 mm midline shift with parafalcine component of subdural hematoma and a left frontal intraparenchymal hematoma is likely related to a contusion.  Basal cisterns remain patent.  Assessment/Plan 65 year old male with  1. Traumatic left convexity subdural hematoma with traumatic intraparenchymal hematoma and midline shift  -Admit to neuro ICU -Every hour neurochecks -SBP goal less than 150 -Keppra 500 twice daily -Repeat CT in a.m. -Pain control -PT/OT eval   Thank you for allowing me to participate in this patient's care.  Please do not hesitate to call with questions or concerns.   Elwin Sleight, [facility] Cell: [phone]

## 2020-11-11 ENCOUNTER — Other Ambulatory Visit (HOSPITAL_COMMUNITY): Payer: Self-pay | Admitting: Neurological Surgery

## 2020-11-11 ENCOUNTER — Inpatient Hospital Stay (HOSPITAL_COMMUNITY): Payer: BLUE CROSS/BLUE SHIELD

## 2020-11-11 LAB — MRSA PCR SCREENING: MRSA by PCR: NEGATIVE

## 2020-11-11 LAB — SARS CORONAVIRUS 2 (TAT 6-24 HRS): SARS Coronavirus 2: NEGATIVE

## 2020-11-11 MED ORDER — LEVETIRACETAM 500 MG PO TABS: 500.0000 mg | ORAL_TABLET | Freq: Two times a day (BID) | ORAL | 0 refills | Status: DC

## 2020-11-11 MED ORDER — LEVETIRACETAM 500 MG PO TABS
500.0000 mg | ORAL_TABLET | Freq: Two times a day (BID) | ORAL | Status: DC
  Administered 2020-11-11: 500 mg via ORAL
  Filled 2020-11-11: qty 1

## 2020-11-11 MED ORDER — HYDROCODONE-ACETAMINOPHEN 5-325 MG PO TABS: 1.0000 | ORAL_TABLET | ORAL | 0 refills | Status: DC | PRN

## 2020-11-11 MED FILL — HYDROCODON-APAP 5-325: 5-325 | 3 days supply | Qty: 30 | Fill #0

## 2020-11-11 MED FILL — levETIRAcetam 500 MG TABS: 500 | 6 days supply | Qty: 12 | Fill #0

## 2020-11-11 NOTE — Progress Notes (Signed)
Provided pt with discharge paperwork and answered all questions to satisfaction.

## 2020-11-11 NOTE — Evaluation (Signed)
Physical Therapy Evaluation Patient Details Name: Shane Sawyer MRN: [id number] DOB: October 03, 1956 Today's Date: 11/11/2020   History of Present Illness  65 yo M with history of HTN, that had a fall NYE down 14 steps while helping his friend move, hit his head on concrete.  Had positive LOC for a few seconds. Pt with Traumatic left convexity subdural hematoma with traumatic intraparenchymal hematoma and midline shift  Clinical Impression  Pt is close to baseline functioning and should be safe at home with intermittent assist from wife.  Fielded question related to concussion/TBI and general recs between d/c and follow up.  Pt and wife verbalize basic understanding of the therapy recs. There are no further acute PT needs.  Will sign off at this time.     Follow Up Recommendations No PT follow up    Equipment Recommendations  None recommended by PT    Recommendations for Other Services       Precautions / Restrictions Precautions Precautions: None      Mobility  Bed Mobility Overal bed mobility: Independent                  Transfers Overall transfer level: Modified independent                  Ambulation/Gait Ambulation/Gait assistance: Independent Gait Distance (Feet): 400 Feet Assistive device: None Gait Pattern/deviations: Step-through pattern   Gait velocity interpretation: >2.62 ft/sec, indicative of community ambulatory General Gait Details: steady and able to increase gait speed to age appropriate levels.  Pt accepted moderate challenge without deviation.  Stairs Stairs: Yes Stairs assistance: Independent Stair Management: No rails;Alternating pattern;Forwards Number of Stairs: 10 General stair comments: steady without rails  Wheelchair Mobility    Modified Rankin (Stroke Patients Only)       Balance Overall balance assessment: No apparent balance deficits (not formally assessed);Independent                                            Pertinent Vitals/Pain Pain Assessment: 0-10 Pain Score: 4  Pain Location: HA Pain Descriptors / Indicators: Aching Pain Intervention(s): Limited activity within patient's tolerance    Home Living Family/patient expects to be discharged to:: Private residence Living Arrangements: Spouse/significant other Available Help at Discharge: Available 24 hours/day;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) Type of Home: House Home Access: Stairs to enter Entrance Stairs-Rails: None Entrance Stairs-Number of Steps: 2 Home Layout: Two level;Bed/bath upstairs;Able to live on main level with bedroom/bathroom Home Equipment: None      Prior Function Level of Independence: Independent         Comments: works buying/selling cars     Journalist, newspaper   Dominant Hand: Right    Extremity/Trunk Assessment   Upper Extremity Assessment Upper Extremity Assessment: Overall WFL for tasks assessed    Lower Extremity Assessment Lower Extremity Assessment: Overall WFL for tasks assessed    Cervical / Trunk Assessment Cervical / Trunk Assessment: Normal  Communication   Communication: No difficulties  Cognition Arousal/Alertness: Awake/alert Behavior During Therapy: WFL for tasks assessed/performed Overall Cognitive Status: Within Functional Limits for tasks assessed                                 General Comments: Assessed with the Sort Blessed test of Memory and Concentration. Scored a 4,  which places him in the normal range (0-4). Note decreased attention adn decreased STM. Wife states he appearsclose to baseline. Educated wife/pt on importance of needing direct supervision with medication and financial management to assure safety. Discussed return to work gradually with wife supervising activity to increase ability to perform usual work tasks. Discussed recommendation to refrian from driving unitl wife feels pt is completely back to baseline. Pt/wife verbalized  understanding.      General Comments      Exercises     Assessment/Plan    PT Assessment Patent does not need any further PT services  PT Problem List         PT Treatment Interventions      PT Goals (Current goals can be found in the Care Plan section)  Acute Rehab PT Goals Patient Stated Goal: to go home and return to work PT Goal Formulation: All assessment and education complete, DC therapy    Frequency     Barriers to discharge        Co-evaluation PT/OT/SLP Co-Evaluation/Treatment: Yes Reason for Co-Treatment: Complexity of the patient's impairments (multi-system involvement) PT goals addressed during session: Mobility/safety with mobility OT goals addressed during session: ADL's and self-care       AM-PAC PT "6 Clicks" Mobility  Outcome Measure Help needed turning from your back to your side while in a flat bed without using bedrails?: None Help needed moving from lying on your back to sitting on the side of a flat bed without using bedrails?: None Help needed moving to and from a bed to a chair (including a wheelchair)?: None Help needed standing up from a chair using your arms (e.g., wheelchair or bedside chair)?: None Help needed to walk in hospital room?: None Help needed climbing 3-5 steps with a railing? : None 6 Click Score: 24    End of Session   Activity Tolerance: Patient tolerated treatment well Patient left: in bed;with call bell/phone within reach;with family/visitor present Nurse Communication: Mobility status PT Visit Diagnosis: Other symptoms and signs involving the nervous system (R29.898)    Time: 3536-1443 PT Time Calculation (min) (ACUTE ONLY): 30 min   Charges:   PT Evaluation $PT Eval Moderate Complexity: 1 Mod          11/11/2020  Ginger Carne., PT Acute Rehabilitation Services [phone]  (pager) [phone]  (office)  Tessie Fass Cody Oliger 11/11/2020, 5:33 PM

## 2020-11-11 NOTE — Discharge Instructions (Signed)
Intracerebral Hemorrhage  An intracerebral hemorrhage occurs when a blood vessel in the brain leaks or bursts (ruptures). As a result, that area of the brain becomes damaged. This is due to a lack of blood and oxygen, and from leaked blood that pools and presses on brain tissue. This is also called a bleeding (hemorrhagic) stroke. A stroke is a medical emergency. It must be treated right away. Early treatment will increase the chances of a better recovery. Delay may lead to permanent loss of brain function. What are the causes? This condition may be caused by:  Head injury (trauma).  A bulging of a weak section in a blood vessel (aneurysm).  Thin and hardened blood vessels due to plaque buildup on the walls of an artery.  Tangled blood vessels in the brain (arteriovenous malformation).  A brain tumor.  Protein buildup on the artery walls of the brain (amyloid angiopathy). Sometimes, the cause of this condition is not known. What increases the risk? You are more likely to develop this condition if you:  Have high blood pressure (hypertension).  Are an older adult.  Abuse alcohol or drugs.  Take blood-thinning medicines (anticoagulants). What are the signs or symptoms? Symptoms of this condition include:  The sudden onset of: ? Weakness or numbness of the face, arm, or leg, especially on one side of the body. ? Nausea and vomiting. ? Trouble speaking or understanding speech. ? Trouble seeing in one or both eyes. ? Difficulty walking or moving the arms or legs. ? Dizziness, or loss of balance or coordination. ? Confusion. ? Headache.  Seizures. How is this diagnosed? This condition may be diagnosed based on:  Your symptoms, medical history, and a physical exam.  Tests, including: ? CT scan or MRI to view the brain. ? Computed tomography angiography (CTA) or a magnetic resonance angiogram (MRA) to view vessels in the brain. How is this treated? The goals of treatment  are to stop the bleeding, control pressure in the brain, and relieve symptoms. Treatment may include:  Medicines to treat symptoms, such as hypertension, pain, nausea, and vomiting.  Other medicines, blood products, or vitamin K to control the bleeding.  A tube (shunt) in the brain to relieve pressure.  Assisted breathing (ventilation).  Surgery to stop bleeding, remove a blood clot or tumor, and reduce pressure. Surgeries may include: ? Craniotomy. This temporarily removes part of the skull in order to reduce pressure in the brain. ? Stereotactic aspiration. This uses a needle or syringe to remove blood from the brain. Follow these instructions at home: Medicines  Take over-the-counter and prescription medicines only as told by your health care provider.  Do not take medicines, such as aspirin and ibuprofen, unless your health care provider tells you to take them. These medicines can thin your blood and increase the risk of bleeding. Eating and drinking  Eat healthy foods as directed by your health care provider. You may be asked to: ? Eat a diet that is low in salt (sodium), saturated fat, trans fat, and cholesterol to manage your blood pressure.  Seek the help of specialists if your ability to swallow safely has been affected. A dietitian, speech-language specialists, and an occupational therapist can teach you how to get the nutrition you need.  If you drink alcohol: ? Limit how much you use to:  0-1 drink a day for women who are not pregnant.  0-2 drinks a day for men. ? Know how much alcohol is in your drink. In the  [place] one drink equals one 12 oz bottle of beer (355 mL), one 5 oz glass of wine (148 mL), or one 1 oz glass of hard liquor (44 mL). Lifestyle  Do not use any products that contain nicotine or tobacco. These include cigarettes, chewing tobacco, and vaping devices, such as e-cigarettes. If you need help quitting, ask your health care provider.  Follow your  health care provider's instructions about preventing falls. Your health care provider may: ? Arrange for specialists to evaluate your home. ? Recommend that you install grab bars in the bedroom and bathroom. ? Arrange for special equipment to be used at home, such as a raised toilet and a seat for the shower.  Use a walker or a cane at all times if directed by your health care provider.  Do physical activities as told by your health care provider. Ask your health care provider what activities are safe for you. General instructions  Follow instructions to manage any other health conditions you may have, including high blood pressure, diabetes, and losing weight if needed.  Keep all follow-up visits. This is important. Visits include referrals for physical and occupational therapy, rehabilitation, and lab tests. Get help right away if:  You have any symptoms of a stroke. "BE FAST" is an easy way to remember the main warning signs of a stroke: ? B - Balance. Signs are dizziness, sudden trouble walking, or loss of balance. ? E - Eyes. Signs are trouble seeing or a sudden change in vision. ? F - Face. Signs are sudden weakness or numbness of the face, or the face or eyelid drooping on one side. ? A - Arms. Signs are weakness or numbness in an arm. This happens suddenly and usually on one side of the body. ? S - Speech. Signs are sudden trouble speaking, slurred speech, or trouble understanding what people say. ? T - Time. Time to call emergency services. Write down what time symptoms started.  You have other signs of a stroke, such as: ? A sudden, severe headache with no known cause. ? Nausea or vomiting. ? Seizure. ? You have a partial or total loss of consciousness. ? Confusion. These symptoms may represent a serious problem that is an emergency. Do not wait to see if the symptoms will go away. Get medical help right away. Call your local emergency services (911 in the [place] Do not drive  yourself to the hospital.   Summary  An intracerebral hemorrhage occurs when a blood vessel in the brain leaks or bursts. This is a medical emergency.  Early treatment will increase the chances of a better recovery. Delay may lead to permanent damage and loss of brain function.  The goals of treatment are to stop the bleeding, control pressure in the brain, and relieve symptoms.  Keep all follow-up visits. This is important. Visits include referrals for physical and occupational therapy, rehabilitation, and lab tests. This information is not intended to replace advice given to you by your health care provider. Make sure you discuss any questions you have with your health care provider. Document Revised: 07/21/2020 Document Reviewed: 07/21/2020 Elsevier Patient Education  2021 [employer].

## 2020-11-11 NOTE — Progress Notes (Signed)
Occupational Therapy Evaluation Patient Details Name: Shane Sawyer MRN: [id number] DOB: 01-24-1956 Today's Date: 11/11/2020    History of Present Illness 65 yo M with history of HTN, that had a fall NYE down 14 steps while helping his friend move, hit his head on concrete.  Had positive LOC for a few seconds. Pt with Traumatic left convexity subdural hematoma with traumatic intraparenchymal hematoma and midline shift   Clinical Impression   Pt most likely close to baseline regarding mobility and ADL tasks. Pt/wife asking questions about return to work, driving, drinking beer and caffeine. Pt appears to be distracted at times. Discussed recommendations for IADL tasks as noted below, including return to work and driving. Pt encouraged to return to activities slowly. Dr Reatha Armour messaged per pt request and message relayed for pt to refrain from drinking alcohol at this time/OK to drink caffeine. No further OT needed. OT signing off.     Follow Up Recommendations  No OT follow up    Equipment Recommendations  None recommended by OT    Recommendations for Other Services       Precautions / Restrictions Precautions Precautions: None      Mobility Bed Mobility Overal bed mobility: Independent                  Transfers Overall transfer level: Modified independent                    Balance Overall balance assessment: No apparent balance deficits (not formally assessed)                                         ADL either performed or assessed with clinical judgement   ADL Overall ADL's : Independent                                             Vision Baseline Vision/History: Wears glasses (readers) Vision Assessment?: Yes Eye Alignment: Within Functional Limits Ocular Range of Motion: Within Functional Limits Alignment/Gaze Preference: Within Defined Limits Tracking/Visual Pursuits: Able to track stimulus in all quads without  difficulty Saccades: Within functional limits Convergence: Within functional limits Visual Fields: No apparent deficits     Perception Perception Comments: WFL   Praxis Praxis Praxis tested?: Within functional limits    Pertinent Vitals/Pain Pain Assessment: 0-10 Pain Score: 4  Pain Location: HA Pain Descriptors / Indicators: Aching Pain Intervention(s): Limited activity within patient's tolerance     Hand Dominance Right   Extremity/Trunk Assessment Upper Extremity Assessment Upper Extremity Assessment: Overall WFL for tasks assessed   Lower Extremity Assessment Lower Extremity Assessment: Defer to PT evaluation   Cervical / Trunk Assessment Cervical / Trunk Assessment: Normal   Communication Communication Communication: No difficulties   Cognition Arousal/Alertness: Awake/alert Behavior During Therapy: WFL for tasks assessed/performed Overall Cognitive Status: Within Functional Limits for tasks assessed                                 General Comments: Assessed with the Sort Blessed test of Memory and Concentration. Scored a 4, which places him in the normal range (0-4). Note decreased attention adn decreased STM. Wife states he appearsclose to baseline. Educated wife/pt on importance of  needing direct supervision with medication and financial management to assure safety. Discussed return to work gradually with wife supervising activity to increase ability to perform usual work tasks. Discussed recommendation to refrian from driving unitl wife feels pt is completely back to baseline. Pt/wife verbalized understanding.   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) Type of Home: House Home Access: Stairs to enter [employer] of Steps: 2 Entrance Stairs-Rails: None Home Layout: Two level;Bed/bath upstairs;Able to live on main level with bedroom/bathroom Alternate Level Stairs-Number of Steps: flight Alternate Level Stairs-Rails: Right;Left Bathroom Shower/Tub: Teacher, early years/pre: Standard     Home Equipment: None          Prior Functioning/Environment Level of Independence: Independent        Comments: works buying/selling cars        OT Problem List: Decreased safety awareness;Pain      OT Treatment/Interventions:      OT Goals(Current goals can be found in the care plan section) Acute Rehab OT Goals Patient Stated Goal: to go home and return to work OT Goal Formulation: All assessment and education complete, DC therapy  OT Frequency:     Barriers to D/C:            Co-evaluation PT/OT/SLP Co-Evaluation/Treatment: Yes (partial session) Reason for Co-Treatment: Complexity of the patient's impairments (multi-system involvement);For patient/therapist safety   OT goals addressed during session: ADL's and self-care      AM-PAC OT "6 Clicks" Daily Activity     Outcome Measure Help from another person eating meals?: None Help from another person taking care of personal grooming?: None Help from another person toileting, which includes using toliet, bedpan, or urinal?: None Help from another person bathing (including washing, rinsing, drying)?: None Help from another person to put on and taking off regular upper body clothing?: None Help from another person to put on and taking off regular lower body clothing?: None 6 Click Score: 24   End of Session Equipment Utilized During Treatment: Gait belt Nurse Communication: Mobility status;Other (comment) (DC recommendations)  Activity Tolerance: Patient tolerated treatment well Patient left: in bed;with call bell/phone within reach;with family/visitor present  OT Visit Diagnosis: Unsteadiness on feet  (R26.81);Pain                Time: 1025-1055 OT Time Calculation (min): 30 min Charges:  OT General Charges $OT Visit: 1 Visit OT Evaluation $OT Eval Moderate Complexity: [place], OT/L   Acute OT Clinical Specialist Acute Rehabilitation Services Pager [phone] Office [phone]   [facility] 11/11/2020, 2:43 PM

## 2020-11-11 NOTE — Progress Notes (Signed)
  OT Note  Pt evaluated by OT/PT. No follow up needed. OT/PT signing off.  Full notes to follow.  Maurie Boettcher, OT/L   Acute OT Clinical Specialist Acute Rehabilitation Services Pager [phone] Office [phone]

## 2020-11-11 NOTE — Progress Notes (Signed)
   Providing Compassionate, Quality Care - Together  NEUROSURGERY PROGRESS NOTE   S: No issues overnight. Persistent HA, no sz  O: EXAM:  BP (!) 92/47   Pulse 72   Temp 98.3 F (36.8 C) (Oral)   Resp 15   SpO2 96%   Awake, alert, oriented x3 PERRLA EOMI Face symmetric Speech fluent, appropriate  CNs grossly intact  5/5 BUE/BLE  No drift Sensory intact light touch  ASSESSMENT:  65 y.o. male with   1. Traumatic left convexity subdural hematoma with traumatic intraparenchymal hematoma and midline shift   PLAN: -PT/OT eval -Pain control -Keppra x1 week -Repeat CT this a.m. shows slight improvement in size of subdural and mass-effect.  Pending progression today, plan discharge home with follow-up in 2 weeks with repeat imaging    Thank you for allowing me to participate in this patient's care.  Please do not hesitate to call with questions or concerns.   Elwin Sleight, [facility] Cell: [phone]

## 2020-11-13 NOTE — Discharge Summary (Signed)
Physician Discharge Summary  Patient ID: Shane Sawyer MRN: [id number] DOB/AGE: 04-04-1956 65 y.o.  Admit date: 11/10/2020 Discharge date: 11/13/2020  Admission Diagnoses:  1. Left acute subdural hematoma, traumatic 2.  Left traumatic frontal intraparenchymal hemorrhage  Discharge Diagnoses:  Same Active Problems:   Subdural hematoma [facility])   Discharged Condition: Stable  Hospital Course:  Shane Sawyer is a 65 y.o. male that presented to the hospital with a severe headache approximately 1-1/2 weeks after a fall down 14 steps.  CT revealed a left acute subdural hematoma with midline shift.  Neurologically he was GCS 15, I placed him in the neuro ICU and he was monitored closely overnight.  Repeat CT the following morning showed overall slightly improved mass-effect and stable subdural and intraparenchymal hematoma.  He was evaluated by Occupational Therapy and physical therapy and felt to be safe to go home.  Neurologically he was at his baseline.  He was tolerating normal diet, having normal bowel and bladder function.  His pain was controlled with oral medication.  Instructions were given of his pathology as well as signs and symptoms to be watching for.  He will follow-up with me with a repeat CT brain in 2 weeks.  Treatments: Close observation  Discharge Exam: Blood pressure (!) 92/47, pulse 75, temperature 97.8 F (36.6 C), temperature source Oral, resp. rate 17, SpO2 96 %. Awake, alert, orientedx3 Speech fluent, appropriate CNs grossly intact 5/5 BUE/BLE PERRLA EOMI No drift  Disposition: Discharge disposition: 01-Home or Self Care        Allergies as of 11/11/2020      Reactions   Penicillins Rash   Did it involve swelling of the face/tongue/throat, SOB, or low BP? no Did it involve sudden or severe rash/hives, skin peeling, or any reaction on the inside of your mouth or nose? no Did you need to seek medical attention at a hospital or doctor's office? No When did it  last happen?childhood If all above answers are "NO", may proceed with cephalosporin use.   Ivp Dye [iodinated Diagnostic Agents]    Swelling in throat, SOB   Sulfur Rash      Medication List    STOP taking these medications   AMBULATORY NON FORMULARY MEDICATION   APPLE CIDER VINEGAR PO   Aspirin Low Dose 81 MG EC tablet Generic drug: aspirin   CHOLEST OFF PO   CO Q-10 PO   COD LIVER OIL PO   FISH OIL PO   FOLIC ACID PO   GARLIC PO   MILK THISTLE PO   thiamine 100 MG tablet Commonly known as: Vitamin B-1   VITAMIN B-3 PO     TAKE these medications   HYDROcodone-acetaminophen 5-325 MG tablet Commonly known as: NORCO/VICODIN Take 1-2 tablets by mouth every 4 (four) hours as needed for moderate pain.   levETIRAcetam 500 MG tablet Commonly known as: KEPPRA Take 1 tablet (500 mg total) by mouth 2 (two) times daily.   losartan-hydrochlorothiazide 100-25 MG tablet Commonly known as: HYZAAR Take 1 tablet by mouth daily.   pantoprazole 40 MG tablet Commonly known as: PROTONIX Take 40 mg by mouth in the morning and at bedtime.   tadalafil 20 MG tablet Commonly known as: CIALIS Take 20 mg by mouth daily as needed for erectile dysfunction.   valsartan 320 MG tablet Commonly known as: DIOVAN Take 320 mg by mouth daily.       Follow-up Information    Shashana Fullington C, DO Follow up in 2 week(s).  Why: we will setup appointment, you will need to get CT brain beforehand which we will setup and they will notify you to schedule it Contact information: [address] [place] [address] [phone]               Signed: Theodoro Doing Amberley Hamler 11/13/2020, 9:21 AM

## 2020-12-09 ENCOUNTER — Other Ambulatory Visit: Payer: Self-pay | Admitting: Neurological Surgery

## 2020-12-28 NOTE — Progress Notes (Signed)
Surgical Instructions    Your procedure is scheduled on 01/01/21.  Report to [facility] Main Entrance "A" at 5:30 A.M., then check in with the Admitting office.  Call this number if you have problems the morning of surgery:  [phone]   If you have any questions prior to your surgery date call [phone]: Open Monday-Friday 8am-4pm    Remember:  Do not eat OR drink after midnight the night before your surgery    Take these medicines the morning of surgery with A SIP OF WATER: pantoprazole (PROTONIX)  HYDROcodone-acetaminophen (NORCO/VICODIN) - if needed  As of today, STOP taking any Aspirin (unless otherwise instructed by your surgeon) Aleve, Naproxen, Ibuprofen, Motrin, Advil, Goody's, BC's, all herbal medications, fish oil, and all vitamins.                     Do not wear jewelry            Do not wear lotions, powders, colognes, or deodorant.            Do not shave 48 hours prior to surgery.  Men may shave face and neck.            Do not bring valuables to the hospital.            [facility] is not responsible for any belongings or valuables.  Do NOT Smoke (Tobacco/Vaping) or drink Alcohol 24 hours prior to your procedure If you use a CPAP at night, you may bring all equipment for your overnight stay.   Contacts, glasses, dentures or bridgework may not be worn into surgery, please bring cases for these belongings   For patients admitted to the hospital, discharge time will be determined by your treatment team.   Patients discharged the day of surgery will not be allowed to drive home, and someone needs to stay with them for 24 hours.    Special instructions:   [facility]- Preparing For Surgery  Before surgery, you can play an important role. Because skin is not sterile, your skin needs to be as free of germs as possible. You can reduce the number of germs on your skin by washing with CHG (chlorahexidine gluconate) Soap before surgery.  CHG is an antiseptic cleaner  which kills germs and bonds with the skin to continue killing germs even after washing.    Oral Hygiene is also important to reduce your risk of infection.  Remember - BRUSH YOUR TEETH THE MORNING OF SURGERY WITH YOUR REGULAR TOOTHPASTE  Please do not use if you have an allergy to CHG or antibacterial soaps. If your skin becomes reddened/irritated stop using the CHG.  Do not shave (including legs and underarms) for at least 48 hours prior to first CHG shower. It is OK to shave your face.  Please follow these instructions carefully.   1. Shower the NIGHT BEFORE SURGERY and the MORNING OF SURGERY  2. If you chose to wash your hair, wash your hair first as usual with your normal shampoo.  3. After you shampoo, rinse your hair and body thoroughly to remove the shampoo.  4. Wash Face and genitals (private parts) with your normal soap.   5.  Shower the NIGHT BEFORE SURGERY and the MORNING OF SURGERY with CHG Soap.   6. Use CHG Soap as you would any other liquid soap. You can apply CHG directly to the skin and wash gently with a scrungie or a clean washcloth.   7. Apply  the CHG Soap to your body ONLY FROM THE NECK DOWN.  Do not use on open wounds or open sores. Avoid contact with your eyes, ears, mouth and genitals (private parts). Wash Face and genitals (private parts)  with your normal soap.   8. Wash thoroughly, paying special attention to the area where your surgery will be performed.  9. Thoroughly rinse your body with warm water from the neck down.  10. DO NOT shower/wash with your normal soap after using and rinsing off the CHG Soap.  11. Pat yourself dry with a CLEAN TOWEL.  12. Wear CLEAN PAJAMAS to bed the night before surgery  13. Place CLEAN SHEETS on your bed the night before your surgery  14. DO NOT SLEEP WITH PETS.   Day of Surgery: Wear Clean/Comfortable clothing the morning of surgery Do not apply any deodorants/lotions.   Remember to brush your teeth WITH YOUR  REGULAR TOOTHPASTE.   Please read over the following fact sheets that you were given.

## 2020-12-29 ENCOUNTER — Other Ambulatory Visit: Payer: Self-pay

## 2020-12-29 ENCOUNTER — Encounter (HOSPITAL_COMMUNITY): Payer: Self-pay

## 2020-12-29 ENCOUNTER — Other Ambulatory Visit (HOSPITAL_COMMUNITY)
Admission: RE | Admit: 2020-12-29 | Discharge: 2020-12-29 | Disposition: A | Discharge status: Home / Self Care | Payer: BLUE CROSS/BLUE SHIELD | Source: Ambulatory Visit | Attending: Neurological Surgery | Admitting: Neurological Surgery

## 2020-12-29 ENCOUNTER — Encounter (HOSPITAL_COMMUNITY)
Admission: RE | Admit: 2020-12-29 | Discharge: 2020-12-29 | Disposition: A | Discharge status: Home / Self Care | Payer: BLUE CROSS/BLUE SHIELD | Source: Ambulatory Visit | Attending: Neurological Surgery | Admitting: Neurological Surgery

## 2020-12-29 DIAGNOSIS — Z20822 Contact with and (suspected) exposure to covid-19: Secondary | ICD-10-CM | POA: Diagnosis not present

## 2020-12-29 DIAGNOSIS — Z01812 Encounter for preprocedural laboratory examination: Secondary | ICD-10-CM | POA: Insufficient documentation

## 2020-12-29 LAB — CBC WITH DIFFERENTIAL/PLATELET
Abs Immature Granulocytes: 0.09 10*3/uL — ABNORMAL HIGH (ref 0.00–0.07)
Basophils Absolute: 0.1 10*3/uL (ref 0.0–0.1)
Basophils Relative: 1 %
Eosinophils Absolute: 0.2 10*3/uL (ref 0.0–0.5)
Eosinophils Relative: 2 %
HCT: 42.6 % (ref 39.0–52.0)
Hemoglobin: 14.9 g/dL (ref 13.0–17.0)
Immature Granulocytes: 1 %
Lymphocytes Relative: 23 %
Lymphs Abs: 2.1 10*3/uL (ref 0.7–4.0)
MCH: 31.5 pg (ref 26.0–34.0)
MCHC: 35 g/dL (ref 30.0–36.0)
MCV: 90.1 fL (ref 80.0–100.0)
Monocytes Absolute: 0.9 10*3/uL (ref 0.1–1.0)
Monocytes Relative: 10 %
Neutro Abs: 5.6 10*3/uL (ref 1.7–7.7)
Neutrophils Relative %: 63 %
Platelets: 219 10*3/uL (ref 150–400)
RBC: 4.73 MIL/uL (ref 4.22–5.81)
RDW: 13 % (ref 11.5–15.5)
WBC: 8.8 10*3/uL (ref 4.0–10.5)
nRBC: 0 % (ref 0.0–0.2)

## 2020-12-29 LAB — SARS CORONAVIRUS 2 (TAT 6-24 HRS): SARS Coronavirus 2: NEGATIVE

## 2020-12-29 LAB — BASIC METABOLIC PANEL
Anion gap: 8 (ref 5–15)
BUN: 11 mg/dL (ref 8–23)
CO2: 26 mmol/L (ref 22–32)
Calcium: 9 mg/dL (ref 8.9–10.3)
Chloride: 105 mmol/L (ref 98–111)
Creatinine, Ser: 1.01 mg/dL (ref 0.61–1.24)
GFR, Estimated: >60 mL/min (ref >60)
Glucose, Bld: 111 mg/dL — ABNORMAL HIGH (ref 70–99)
Potassium: 4 mmol/L (ref 3.5–5.1)
Sodium: 139 mmol/L (ref 135–145)

## 2020-12-29 LAB — TYPE AND SCREEN
ABO/RH(D): A POS
Antibody Screen: NEGATIVE

## 2020-12-29 LAB — PROTIME-INR
INR: 1 (ref 0.8–1.2)
Prothrombin Time: 12.5 seconds (ref 11.4–15.2)

## 2020-12-29 NOTE — Progress Notes (Signed)
PCP: Bayard Beaver, MD Cardiologist: Alyssa Grove, MD  EKG: 11/11/20 CXR: 06/24/20 ECHO: 01/24/17 Stress Test: 06/29/20 CE Cardiac Cath: denies  Fasting Blood Sugar- na Checks Blood Sugar_na__ times a day  OSA/CPAP:  Yes, usually wears nightly, but not since subdural hematoma.  ASA:  No Blood Thinners:  No  Covid test 12/29/20  Anesthesia Review:  No.  Cardiac studies normal per patient.  Per patient, issues were low K+ and NA+.  Takes supplements and symptoms resolved.   Patient denies shortness of breath, fever, cough, and chest pain at PAT appointment.  Patient verbalized understanding of instructions provided today at the PAT appointment.  Patient asked to review instructions at home and day of surgery.

## 2020-12-30 ENCOUNTER — Other Ambulatory Visit (HOSPITAL_COMMUNITY): Payer: Self-pay | Admitting: Neurological Surgery

## 2020-12-30 ENCOUNTER — Other Ambulatory Visit: Payer: Self-pay | Admitting: Neurological Surgery

## 2020-12-30 DIAGNOSIS — S065X9S Traumatic subdural hemorrhage with loss of consciousness of unspecified duration, sequela: Secondary | ICD-10-CM

## 2021-01-04 ENCOUNTER — Other Ambulatory Visit (HOSPITAL_COMMUNITY)
Admission: RE | Admit: 2021-01-04 | Discharge: 2021-01-04 | Disposition: A | Discharge status: Home / Self Care | Payer: BLUE CROSS/BLUE SHIELD | Source: Ambulatory Visit | Attending: Neurological Surgery | Admitting: Neurological Surgery

## 2021-01-04 DIAGNOSIS — Z01812 Encounter for preprocedural laboratory examination: Secondary | ICD-10-CM | POA: Diagnosis not present

## 2021-01-04 DIAGNOSIS — Z20822 Contact with and (suspected) exposure to covid-19: Secondary | ICD-10-CM | POA: Insufficient documentation

## 2021-01-04 LAB — SARS CORONAVIRUS 2 (TAT 6-24 HRS): SARS Coronavirus 2: NEGATIVE

## 2021-01-06 ENCOUNTER — Inpatient Hospital Stay (HOSPITAL_COMMUNITY)
Admission: RE | Admit: 2021-01-06 | Payer: BLUE CROSS/BLUE SHIELD | Source: Home / Self Care | Admitting: Neurological Surgery

## 2021-01-06 ENCOUNTER — Ambulatory Visit (HOSPITAL_COMMUNITY): Admission: RE | Admit: 2021-01-06 | Payer: BLUE CROSS/BLUE SHIELD | Source: Ambulatory Visit

## 2021-01-06 ENCOUNTER — Encounter (HOSPITAL_COMMUNITY): Admission: RE | Payer: Self-pay | Source: Home / Self Care

## 2021-01-06 ENCOUNTER — Encounter (HOSPITAL_COMMUNITY): Payer: Self-pay

## 2021-01-06 SURGERY — CRANIOTOMY HEMATOMA EVACUATION SUBDURAL
Anesthesia: General | Laterality: Left

## 2021-03-16 ENCOUNTER — Encounter: Payer: Self-pay | Admitting: Gastroenterology

## 2021-10-11 IMAGING — CT CT HEAD W/O CM
4 series · 16 of 47 positions shown, 18 images · non-contrast
Comparison: CT head 11/10/2020

CLINICAL DATA: Follow-up intracranial hemorrhage.  Trauma.

EXAM:
CT HEAD WITHOUT CONTRAST
TECHNIQUE: Contiguous axial images were obtained from the base of the skull
through the vertex without intravenous contrast.

[Series 3: head without · axial · non-contrast · 0.46mm/px · z∈[-62,+68]mm · 7 of 36 slices shown, 9 images]
[im 5/36  brain]
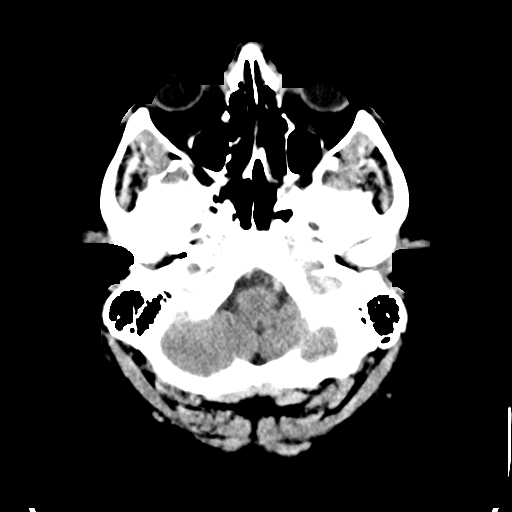
[im 5/36  bone]
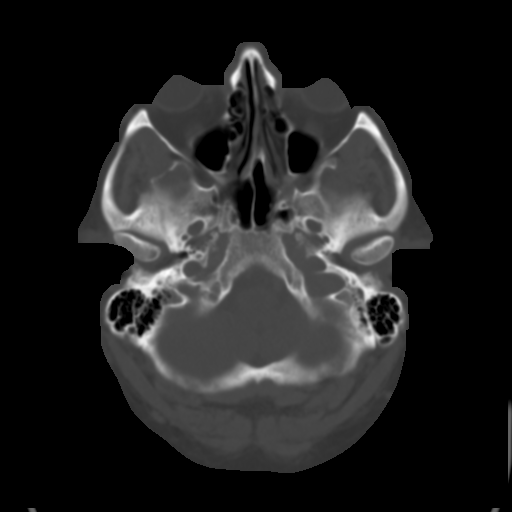
[im 9/36  brain]
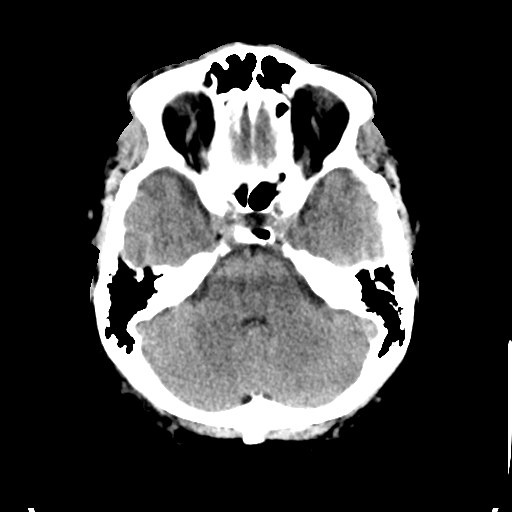
[im 14/36  brain]
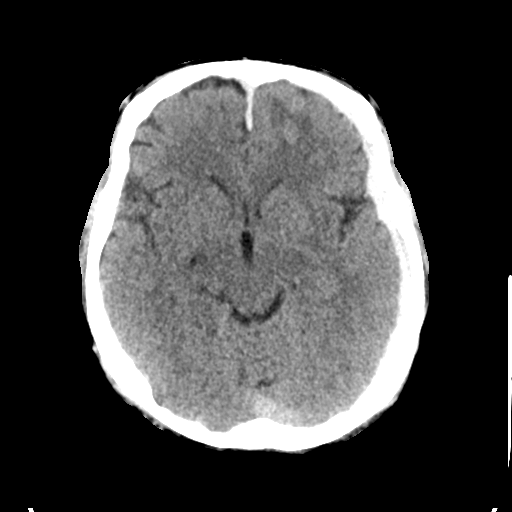
[im 18/36  brain]
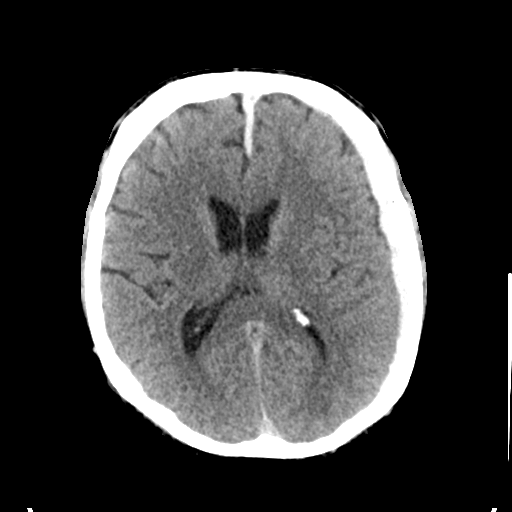
[im 22/36  brain]
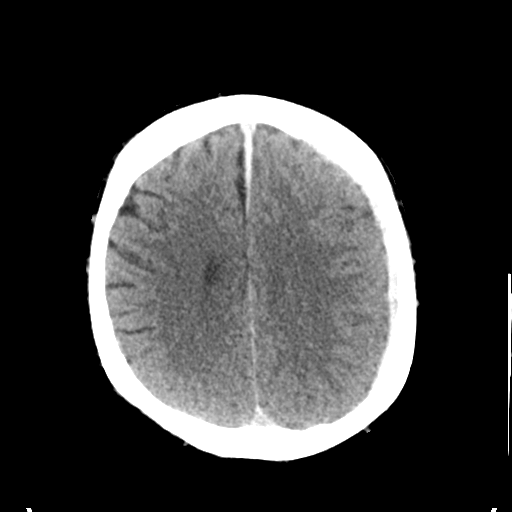
[im 22/36  bone]
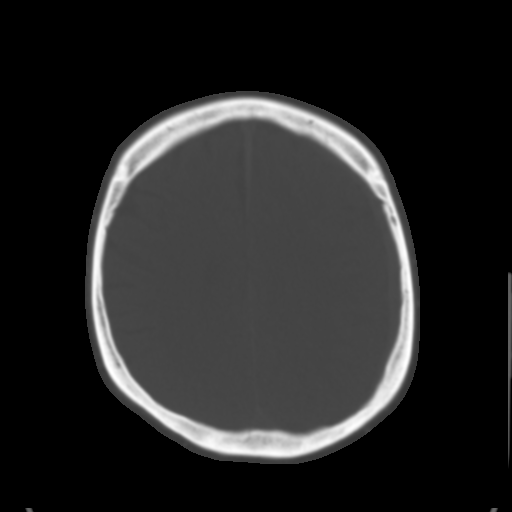
[im 27/36  brain]
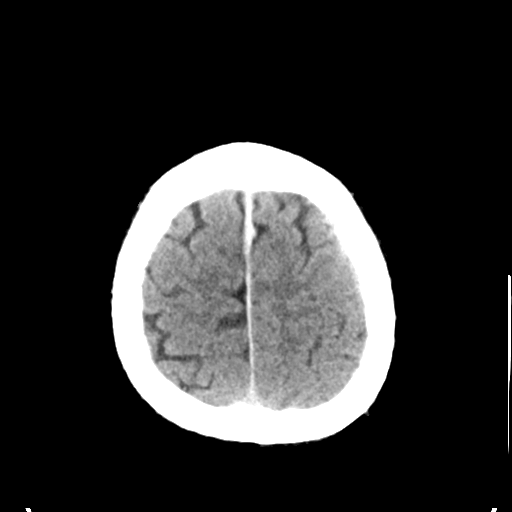
[im 31/36  brain]
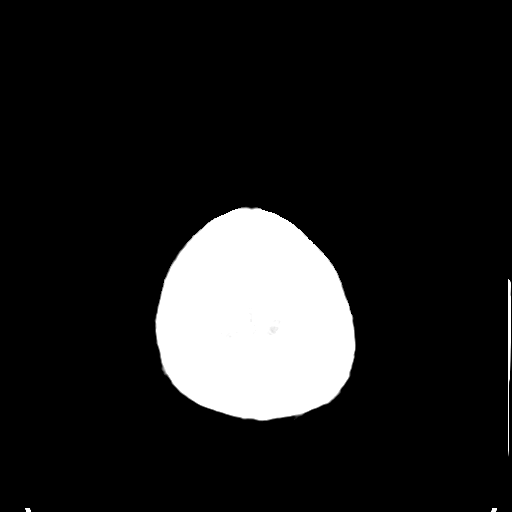

[Series 4: head bone · axial · 0.46mm/px · z∈[-66,-30]mm · 3 of 89 slices shown]
[im 9/89  bone]
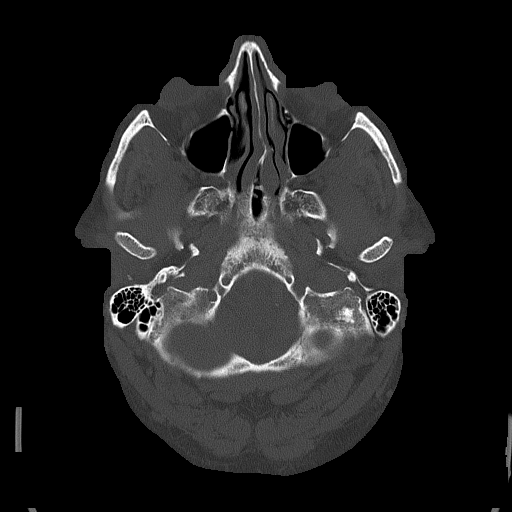
[im 18/89  bone]
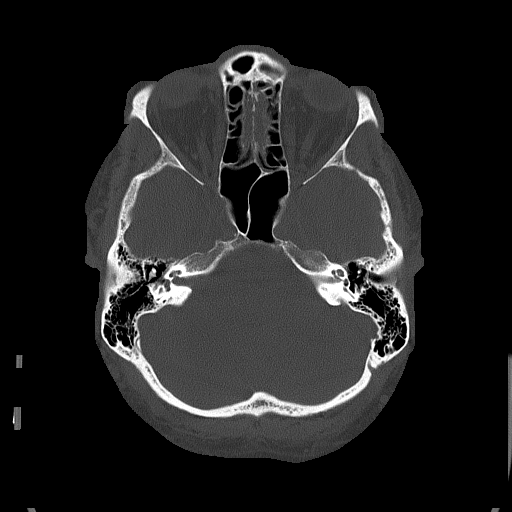
[im 27/89  bone]
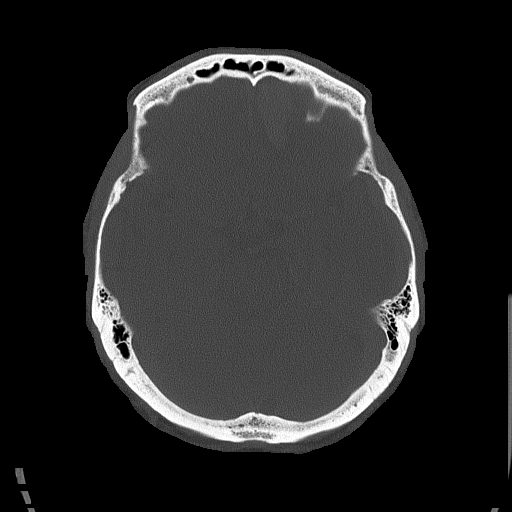

[Series 5: head without cor · coronal · non-contrast · 0.35mm/px · 3 of 67 slices shown]
[im 23/67  brain]
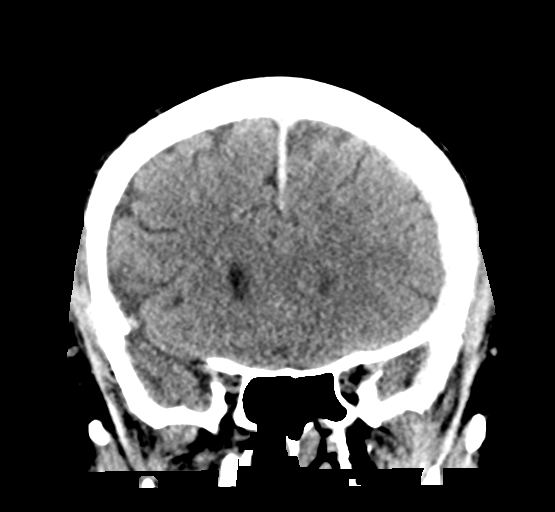
[im 30/67  brain]
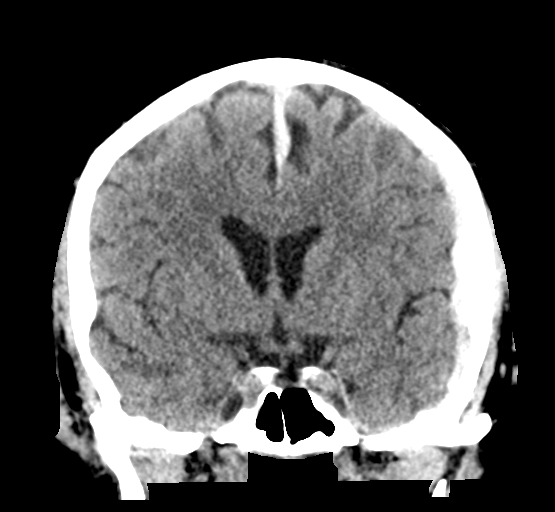
[im 37/67  brain]
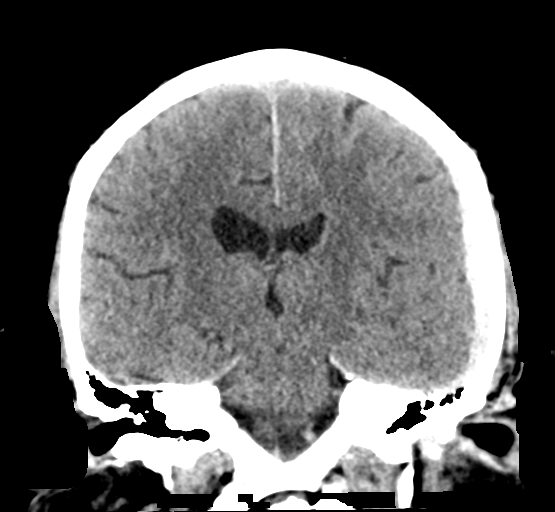

[Series 6: head without sag · sagittal · non-contrast · 0.35mm/px · 3 of 62 slices shown]
[im 21/62  brain]
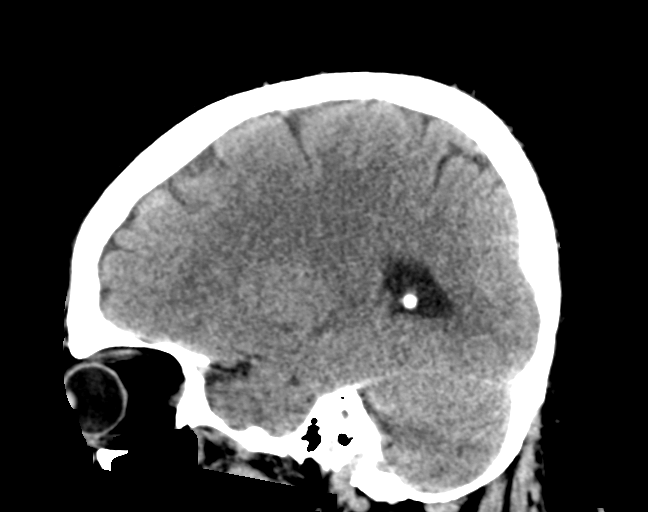
[im 31/62  brain]
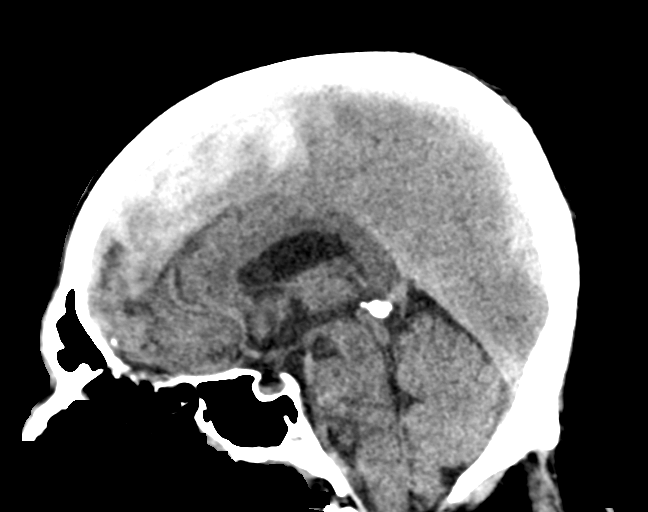
[im 41/62  brain]
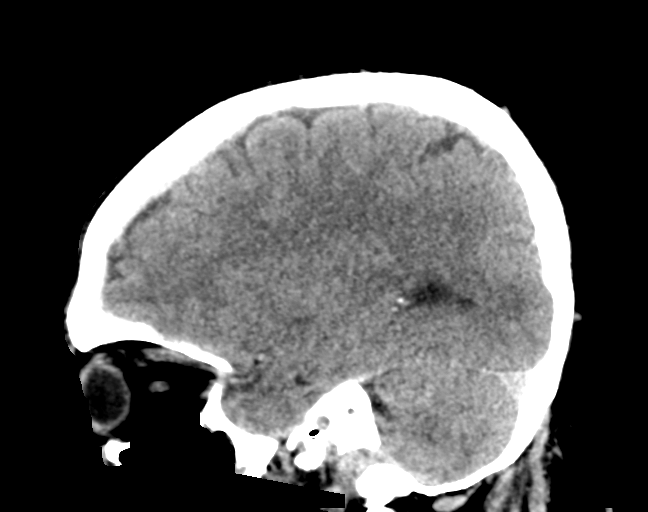

[16 of 47 positions shown; findings below may reference images not displayed]

FINDINGS: Brain: Hematoma in the left anterior frontal lobe shows interval
enlargement now measuring 31 x 16 mm. There is surrounding edema,
similar.

Left hemispheric subdural hematoma is smaller now measuring 5 mm
compared 8 mm previously. Decreased mass-effect and midline shift.
Midline shift now approximately 3 mm. Small interhemispheric
subdural hematoma slightly improved.

Ventricle size normal.  No acute infarct or mass.

Vascular: Negative for hyperdense vessel

Skull: Negative

Sinuses/Orbits: Paranasal sinuses clear.  Negative orbit

Other: None
IMPRESSION: 1. Interval enlargement of hematoma in the left anterior frontal
lobe now measuring 31 x 16 mm
2. Left hemispheric subdural hematoma shows mild interval
improvement. Decreased mass-effect and decreased midline shift.

## 2023-12-18 NOTE — Progress Notes (Signed)
 SUBJECTIVE   Patient ID: Shane Sawyer is a 68 y.o. (DOB February 28, 1956) male  Chief Complaint  Patient presents with  . Sinus drainage    History of Present Illness The patient is a 68 year old male who presents for evaluation of sinus drainage, head pressure, elevated blood pressure, and middle ear effusion.  He reports an improvement in his condition following a bout of pneumonia, which was a new experience for him. He initially presented with symptoms of headache, cough, and expectoration of green and yellow sputum, prompting a course of antibiotics. However, two days later, he developed a chesty cough, necessitating a second course of antibiotics and a chest x-ray that revealed atelectasis. A subsequent chest x-ray indicated resolution of the condition. He completed two courses of azithromycin  and one course of doxycyline. He has discontinued the use of Advair and Ventolin inhalers, which he found uncomfortable and associated with increased coughing. He reports no current lung congestion and is able to take deep breaths without difficulty. He has not used any over-the-counter cold medications in recent weeks.  Over the past year, he has experienced episodes of head pressure and lightheadedness, occasionally requiring him to sit down and support himself. These symptoms were particularly pronounced when he was on medication for lowering his blood pressure, leading him to suspect dehydration as a potential cause. He recalls an episode of these symptoms while walking at work, but has not experienced them recently. He describes the sensation as a sudden pressure in the center of his head, distinct from the sharp, radiating pain he experienced during a previous brain hemorrhage. A CT scan conducted some time ago was normal. He speculates that these episodes may be related to low blood sugar levels, as they often occur on days when he has not eaten much. He also reports feeling tired, which he attributes  to his work schedule. He has a long-standing habit of staying up late and waking up early, and occasionally takes a 30-minute nap during his lunch break. He reports frequent urination, particularly after consuming fluids during his morning workout, and has been informed by his urologist that he does not fully empty his bladder. He has recently reduced his coffee intake, but continues to consume tea with honey. He admits to not drinking as much water as he should due to his aversion to frequent urination. He has been consuming turmeric tea, green tea, and elderberry tea, and is considering eliminating caffeine from his diet. He has been eating carrots daily for their fiber content, and also consumes green peppers, raw beets, oatmeal with blueberries, bananas, tangerines, yogurt with 20 g of protein, and honey with cinnamon. He occasionally craves sweets late at night, and has been trying to satisfy this craving with fruit or cereal. He has been eating Special K almonds and Premier protein cereal, but has recently reduced his cereal intake. He has been eating cherry tomatoes daily, and has been adding olive oil to his brown rice. He has been consuming tuna packs, but is considering reducing his sodium intake if his blood pressure rises.  He has been experiencing a sensation of water in his ear for the past month, particularly when leaning over. He has been tested for mold allergies and reports difficulty hearing and understanding speech. He has Allegra at home and is considering taking it, but is concerned about potential effects on his blood pressure.  He has been monitoring his diet, and has recently incorporated brown rice with olive oil, raw beets, raw carrots, raw  green peppers, broccoli, lemon water, warm water with apple cider vinegar, and red beet powder into his meals. He has also been consuming green powder twice daily. He has been working out regularly and has lost some weight, and plans to increase his  exercise routine this week with a goal of reducing his weight to between 215 and 220 pounds.  FAMILY HISTORY His maternal grandmother had diabetes and died from gangrene.  MEDICATIONS Current: CPAP, Allegra Discontinued: Advair, Ventolin, azithromycin , doxycyline     OBJECTIVE   Allergies  Allergen Reactions  . Food Allergy Formula Anaphylaxis  . Iodinated Contrast Media Rash, Hives and Other (See Comments)    Swelling in throat, SOB  Other reaction(s): other/intolerance  Swelling in throat, SOB  Swelling in throat, SOB   Swelling in throat, SOB   . Iodine Anaphylaxis, Angioedema, Anxiety and Other (See Comments)    Other reaction(s): unknown  . Penicillins Rash, Anaphylaxis, Hives and Other (See Comments)    Other reaction(s): Anaphylaxis, Hives, Rash, Unknown, Other reaction(s): unknown, Did it involve swelling of the face/tongue/throat, SOB, or low BP? no, Did it involve sudden or severe rash/hives, skin peeling, or any reaction on the inside of your mouth or nose? no, Did you need to seek medical attention at a hospital or doctor's office? No, When did it last happen? childhood, If all above answers are "NO", may proceed with cephalosporin use.  Other reaction(s): unknown  Did it involve swelling of the face/tongue/throat, SOB, or low BP? no  Did it involve sudden or severe rash/hives, skin peeling, or any reaction on the inside of your mouth or nose? no  Did you need to seek medical attention at a hospital or doctor's office? No  When did it last happen? childhood  If all above answers are "NO", may proceed with cephalosporin use.  Other reaction(s): Anaphylaxis, Hives, Rash, Unknown  Other reaction(s): unknown  Did it involve swelling of the face/tongue/throat, SOB, or low BP? no  Did it involve sudden or severe rash/hives, skin peeling, or any reaction on the inside of your mouth or nose? no  Did you need to seek medical attention at a hospital or doctor's office? No   When did it last happen? childhood  If all above answers are "NO", may proceed with cephalosporin use.  Did it involve swelling of the face/tongue/throat, SOB, or low BP? no  Did it involve sudden or severe rash/hives, skin peeling, or any reaction on the inside of your mouth or nose? no  Did you need to seek medical attention at a hospital or doctor's office? No  When did it last happen? childhood  If all above answers are "NO", may proceed with cephalosporin use.  . Sulfa (Sulfonamide Antibiotics) Rash, Swelling, Anaphylaxis and Other (See Comments)    Sulfur v Sulfa? PLEASE VALIDATE, , Other reaction(s): Anaphylaxis, Rash, Swelling, Other reaction(s): Unknown: Document details in comments, Sulfur v Sulfa? PLEASE VALIDATE, Sulfur v Sulfa? PLEASE VALIDATE  Other reaction(s): Unknown: Document details in comments  Sulfur v Sulfa? PLEASE VALIDATE  Sulfur v Sulfa? PLEASE VALIDATE  Sulfur v Sulfa? PLEASE VALIDATE    Other reaction(s): Anaphylaxis, Rash, Swelling  Other reaction(s): Unknown: Document details in comments  Sulfur v Sulfa? PLEASE VALIDATE  Sulfur v Sulfa? PLEASE VALIDATE  Sulfur v Sulfa? PLEASE VALIDATE  . Sulfur Rash, Hives and Other (See Comments)    Current Outpatient Medications  Medication Sig Dispense Refill  . apple cider vinegar 600 mg cap Take  by mouth.    SABRA  ascorbic acid (VITAMIN C) 1,000 mg tablet Take 1,000 mg by mouth 2 (two) times a day.    . b complex vitamins tab tablet Take 1 tablet by mouth daily.    . coenzyme Q-10 10 mg capsule Take 10 mg by mouth daily.    . creatinine, bulk, 100 % powd by miscellaneous route.    SABRA olive oil (Sweet Oil) oil Take by mouth.    . omega-3 fatty acids-fish oil (FISH OIL) 1,000 mg cap capsule Take  by mouth 2 (two) times a day.    . tadalafiL  (CIALIS ) 20 mg tablet Take 20 mg by mouth daily as needed for erectile dysfunction. 30 tablet 11  . vitamin K2 40 mcg tab Take by mouth. With D    . zinc sulfate (Zinc-15) 66 mg tab  Take 1 tablet by mouth 2 (two) times a day.    . ferrous sulfate 325 mg (65 mg iron) tablet Take 325 mg by mouth daily before breakfast. (Patient not taking: Reported on 12/18/2023)     No current facility-administered medications for this visit.    Patient Active Problem List  Diagnosis  . Essential hypertension  . Overweight (BMI 25.0-29.9)  . Obstructive sleep apnea on CPAP  . GERD without esophagitis  . Erectile dysfunction  . Peripheral polyneuropathy  . Mixed hyperlipidemia  . 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 Aortic valve sclerosis  . Hypertensive left ventricular hypertrophy, without heart failure  . Cough due to bronchospasm  . Hemorrhoids  . Angioma  . Melanocytic nevi of trunk  . Other seborrheic keratosis  . Syncope  . Allergic rhinitis  . Diastolic heart failure (CMD)  . Change in bowel habits  . History of subdural hematoma  . Paresthesia  . Rectal bleeding  . Tremor    Past Medical History:  Diagnosis Date  . Eye injury    Metal in [facility] at 68 years old; finger stuck in eye by child  . Hypertension   . Hypertension with goal to be determined   . Sleep apnea    CPAP  . 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 Wheezing       Past Surgical History:  Procedure Laterality Date  . COLONOSCOPY N/A 06/10/2019   Procedure: COLONOSCOPY;  Surgeon: Shanna Malcom Denmark, MD;  Location: [facility] OR;  Service: Gastroenterology;  Laterality: N/A;  . ESOPHAGOGASTRODUODENOSCOPY N/A 06/10/2019   Procedure: EGD;  Surgeon: Shanna Malcom Denmark, MD;  Location: [facility] OR;  Service: Gastroenterology;  Laterality: N/A;  . NASAL SEPTUM SURGERY (AKA DEVIATED SEPTUM)     Procedure: NASAL SEPTUM SURGERY; x2    Family History  Problem Relation Name Age of Onset  . Hypertension Mother    . Heart disease Mother    . Blindness Mother    . Macular degeneration Mother    . Retinal  detachment Mother    . Cancer Father    . Hypertension Father    . Cataracts Father    . Eczema Neg Hx    . Psoriasis Neg Hx      BP (!) 148/96   Pulse 68   Ht 1.854 m (6' 1)   Wt 110 kg (241 lb 12.8 oz)   SpO2 98%   BMI 31.90 kg/m   Review of Systems Systemic: Feeling fine.  No fever  and no chills. Head: No headache.  Eyes: No vision problems. Cardiovascular: No chest pain or discomfort. No palpitations Pulmonary: No dyspnea. No cough. Gastrointestinal: No abdominal pain. No nausea, vomiting, or diarrhea. Psychological: No anxiety and/or depression. All other symptoms are reviewed and are negative.  Physical Exam Physical Exam There is a minimal effusion in the right ear.  Vital Signs The patient's blood pressure is 148/96. Constitutional: Oriented to person, place and time. Appears well-developed and well nourished. HEENT: Normocephalic, atraumatic.  EOMI, PERRL. Right sided middle ear effusion.  Neck: No cervical adenopathy. No thyromegaly.  Cardiovascular: Normal rate and rhythm.  Exam reveals no gallop and no friction rub.  No murmur heard.  Pulmonary/chest: Effort normal and breath sounds normal. No wheezes. No rales.  Skin: Warm and dry. No rashes noted. Patient is not diaphoretic. Extremities: No edema bilaterally. No clubbing/cyanosis. Neuro:  CN grossly intact Psychiatric: Normal mood and affect. Behavior normal. Judgement and thought content normal.  Results Imaging Chest x-ray shows no signs of pneumonia, consolidation is gone, and a slight collapse on the right side which is expected to improve with increased activity.     ASSESSMENT/PLAN   1. Dizziness  Comprehensive Metabolic Panel   Hemoglobin A1C With Estimated Average Glucose   Insulin      Assessment & Plan 1. Sinus Drainage. He reports ongoing sinus drainage, particularly during this time of year. He has a history of pneumonia, which has since resolved. He was prescribed azithromycin  and  erythromycin in the past. He is not currently using Advair or Ventolin inhalers. He is advised to continue using Allegra for his sinus issues. He is also advised to monitor his symptoms and use nasal sprays like Flonase if needed.  2. Head Pressure. He experiences head pressure and lightheadedness, which may be related to blood sugar levels. A blood glucose test and an A1c test will be conducted today to assess his blood sugar levels. He is advised to maintain a diet low in glycemic index foods and to monitor his symptoms closely. If his blood sugar levels are high, further management will be discussed.  3. Elevated Blood Pressure. His blood pressure is elevated at 148/96. He has a history of slightly elevated blood pressure readings. He is advised to monitor his blood pressure at home and follow up in 2 weeks for a telehealth visit or an in-person visit to reassess his blood pressure. He is also advised to reduce sodium intake and increase physical activity.  4. Middle Ear Effusion. He reports a sensation of water in his ear, which has been ongoing for a month. Examination reveals a minimal ear effusion likely due to sinus issues. He is advised to take Allegra daily to manage his symptoms. If symptoms persist, further evaluation by an ENT specialist may be considered.  There are no Patient Instructions on file for this visit.  Return in about 2 weeks (around 01/01/2024) for Essential Hypertension.  Orders Placed This Encounter  Procedures  . Comprehensive Metabolic Panel  . Hemoglobin A1C With Estimated Average Glucose  . Insulin    Requested Prescriptions    No prescriptions requested or ordered in this encounter    Risks, benefits, and alternatives of the medication(s) and treatment plan(s) were discussed, and he expressed understanding. Plan follow up as discussed or as needed. No barriers to treatment identified in this visit.

## 2024-03-05 NOTE — Progress Notes (Signed)
 Assessment:  Encounter Diagnoses  Name Primary?  . Hypogonadism in male Yes  . BPH with obstruction/lower urinary tract symptoms   . Organic impotence   . Prostate cancer screening       Plan: 1.  Start 100 mg T injections every week 2.  Continue VioNex as needed 3.  Prostate cancer screening today 4.  Follow-up in 6 weeks with T/CBC prior   Referring Physician:  No referring provider defined for this encounter.  PCP:  Tully Shawnee Gladis Sharl, PA-C  Chief Complaint  Patient presents with  . Hypogonadism    HPI:  68 year old male with history of ED and BPH presents for follow-up.   IPSS is 11/1.  Significant frequency, urgency.  Nocturia x 3.  Has since stopped Flomax.  Tried Myrbetriq previously.  Symptoms present many years.  PVR was 317 prior to Flomax.  PVR is 31.     Given Bimix but has not used.  Trimix called priapism at 0.1 mL.  Failed sildenafil and tadalafil .  No nitrates.    Notes downward left curvature of penis that is painful with erections.  Also has skin coloration changes from bruising.  Relates to shot 6 months ago.  Notes only using right corpora for injections.  Exam reveals minor skin discoloration on the right side with no evidence of bruising or malignancy.  No penile plaque palpable.    Early a.m. T 243 in 8/24 and 315 in 5/25.  Prolactin/estradiol normal.  Previously used anabolic steroid mixture he ordered online for building muscle mass.  Has decreased energy and libido and is worried about low T.  No current meds.     DRE 40 g benign/PSA 1.24 in 8/24.     LAB RESULTS REVIEWED: Most recent serum creatinine level: Lab Results  Component Value Date/Time   CREATININE 0.75 12/18/2023 10:04 AM    PSA levels: Lab Results  Component Value Date   PSA 1.24 06/14/2023   PSA 0.87 06/29/2022   PSA 0.70 08/13/2021    Serum testosterone levels: Lab Results  Component Value Date   TESTOSTERONE 315 03/12/2024   TESTOSTERONE 243 06/14/2023      PMH:  Past Medical History:  Diagnosis Date  . Eye injury    Metal in [facility] at 68 years old; finger stuck in eye by child  . Hypertension   . Hypertension with goal to be determined   . Sleep apnea    CPAP  . 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 Wheezing      PSH: Past Surgical History:  Procedure Laterality Date  . COLONOSCOPY N/A 06/10/2019   Procedure: COLONOSCOPY;  Surgeon: Shanna Malcom Denmark, MD;  Location: [facility] OR;  Service: Gastroenterology;  Laterality: N/A;  . ESOPHAGOGASTRODUODENOSCOPY N/A 06/10/2019   Procedure: EGD;  Surgeon: Shanna Malcom Denmark, MD;  Location: [facility] OR;  Service: Gastroenterology;  Laterality: N/A;  . NASAL SEPTUM SURGERY (AKA DEVIATED SEPTUM)     Procedure: NASAL SEPTUM SURGERY; x2       Medications: Current Outpatient Medications  Medication Sig Dispense Refill  . apple cider vinegar 600 mg cap Take  by mouth.    SABRA ascorbic acid (VITAMIN C) 1,000 mg tablet Take 1,000 mg by mouth 2 (two) times a day.    . b complex vitamins tab tablet Take 1 tablet by mouth daily.    . coenzyme  Q-10 10 mg capsule Take 10 mg by mouth daily.    . creatinine, bulk, 100 % powd by miscellaneous route.    . ferrous sulfate 325 mg (65 mg iron) tablet Take 325 mg by mouth daily before breakfast.    . olive oil (Sweet Oil) oil Take by mouth.    . omega-3 fatty acids-fish oil (FISH OIL) 1,000 mg cap capsule Take  by mouth 2 (two) times a day.    . tadalafiL  (CIALIS ) 20 mg tablet Take 20 mg by mouth daily as needed for erectile dysfunction. 30 tablet 11  . vitamin K2 40 mcg tab Take by mouth. With D    . zinc sulfate (Zinc-15) 66 mg tab Take 1 tablet by mouth 2 (two) times a day.    . testosterone cypionate (DEPO-TESTOSTERONE) 200 mg/mL injection Inject 0.5 mL (100 mg total) into the muscle every 7 days. 10 mL 2   No current facility-administered medications for this  visit.    Allergies: Food allergy formula, Iodinated contrast media, Iodine, Penicillins, Sulfa (sulfonamide antibiotics), and Sulfur   Social History: Patient  reports that he has never smoked. He has been exposed to tobacco smoke. He has never used smokeless tobacco. He reports current alcohol use of about 4.0 standard drinks of alcohol per week. He reports that he does not use drugs.   Family History: The patient's family history includes Blindness in his mother; Cancer in his father; Cataracts in his father; Heart disease in his mother; Hypertension in his father and mother; Macular degeneration in his mother; Retinal detachment in his mother.  Review of Systems: A complete review of systems was obtained including: Constitutional, Eyes, ENT, Cardiovascular, Respiratory, GI, GU, Musculoskeletal, Skin, Neurological, Psychiatric, Endocrine, Heme/Lymphatic, and Allergic/Immunologic systems. It is negative or non-contributory to the patient's management except for as stated in this note.   Physical Exam: BP 151/85 (BP Location: Right arm, Patient Position: Sitting)   Pulse 97   Temp 97.5 F (36.4 C) (Oral)   Resp 12  GENERAL APPEARANCE:  Well appearing, well developed, well nourished, NAD HEENT: Atraumatic, Normocephalic NECK: Supple without lymphadenopathy or masses. LUNGS: Clear to auscultation bilaterally, Normal  respiratory effort HEART: no obvious cyanosis ABDOMEN: Soft, non-tender, No Masses. EXTREMITIES: Moves all extremities well.  Without edema. NEUROLOGIC:  Alert and oriented x 3, CN II-XII grossly intact.  MENTAL STATUS:  Appropriate. BACK:  Non-tender to palpation.  No CVAT SKIN:  Warm, dry and intact.   GENT:  Results:   Urinalysis and laboratory results, if present, reviewed and analyzed. No results found for this or any previous visit (from the past 24 hours).

## 2024-03-11 NOTE — Telephone Encounter (Signed)
 Copied from CRM #[id number]. Topic: Schedule Appointment - Schedule Patient >> Mar 11, 2024  9:49 AM Rosalene DEL wrote: Cyran, KAIREE ISA Sam called to schedule, cancel, reschedule or confirm an appointment. Unable to schedule per department protocol or guidelines. Sent message to [employer]. [facility] -  Sharl Shaper >> Mar 11, 2024  9:51 AM Rosalene DEL wrote: [place], BROCKTON MCKESSON Sam is calling other request    Include all details related to the request(s) below: Patient calling requesting to come to this office for blood draw.  Notified dont do outside labs States doctor will let him if notified      Confirm and type the Best Contact Number below:  Patient/caller contact number: patient/[phone]            [] Home  [x] Mobile  [] Work  []  Other   [x]  Okay to leave a voicemail   Medication List:  Current Outpatient Medications:  .  apple cider vinegar 600 mg cap, Take  by mouth., Disp: , Rfl:  .  ascorbic acid (VITAMIN C) 1,000 mg tablet, Take 1,000 mg by mouth 2 (two) times a day., Disp: , Rfl:  .  b complex vitamins tab tablet, Take 1 tablet by mouth daily., Disp: , Rfl:  .  coenzyme Q-10 10 mg capsule, Take 10 mg by mouth daily., Disp: , Rfl:  .  creatinine, bulk, 100 % powd, by miscellaneous route., Disp: , Rfl:  .  ferrous sulfate 325 mg (65 mg iron) tablet, Take 325 mg by mouth daily before breakfast. (Patient not taking: Reported on 12/18/2023), Disp: , Rfl:  .  olive oil (Sweet Oil) oil, Take by mouth., Disp: , Rfl:  .  omega-3 fatty acids-fish oil (FISH OIL) 1,000 mg cap capsule, Take  by mouth 2 (two) times a day., Disp: , Rfl:  .  tadalafiL  (CIALIS ) 20 mg tablet, Take 20 mg by mouth daily as needed for erectile dysfunction., Disp: 30 tablet, Rfl: 11 .  vitamin K2 40 mcg tab, Take by mouth. With D, Disp: , Rfl:  .  zinc sulfate (Zinc-15) 66 mg tab, Take 1 tablet by mouth 2 (two) times a day., Disp: , Rfl:      Medication Request/Refills: Pharmacy  Information (if applicable)   [x]  Not Applicable       []  Pharmacy listed  Send Medication Request to:                                                 []  Pharmacy not listed (added to pharmacy list in Epic) Send Medication Request to:      Listed Pharmacies: [facility] [id number] - [place], [place] - [address] - PHONE: [phone] - FAX: [phone] [facility] [place],  - [address] - PHONE: [phone] - FAX: [phone]

## 2024-06-19 ENCOUNTER — Other Ambulatory Visit: Payer: Self-pay

## 2024-06-19 ENCOUNTER — Emergency Department (HOSPITAL_BASED_OUTPATIENT_CLINIC_OR_DEPARTMENT_OTHER): Payer: Medicare (Managed Care)

## 2024-06-19 ENCOUNTER — Emergency Department (HOSPITAL_BASED_OUTPATIENT_CLINIC_OR_DEPARTMENT_OTHER)
Admission: EM | Admit: 2024-06-19 | Discharge: 2024-06-19 | Disposition: A | Discharge status: Home / Self Care | Payer: Medicare (Managed Care) | Attending: Emergency Medicine | Admitting: Emergency Medicine

## 2024-06-19 ENCOUNTER — Encounter (HOSPITAL_BASED_OUTPATIENT_CLINIC_OR_DEPARTMENT_OTHER): Payer: Self-pay | Admitting: Emergency Medicine

## 2024-06-19 DIAGNOSIS — K2100 Gastro-esophageal reflux disease with esophagitis, without bleeding: Secondary | ICD-10-CM | POA: Insufficient documentation

## 2024-06-19 DIAGNOSIS — Z79899 Other long term (current) drug therapy: Secondary | ICD-10-CM | POA: Diagnosis not present

## 2024-06-19 DIAGNOSIS — R011 Cardiac murmur, unspecified: Secondary | ICD-10-CM | POA: Insufficient documentation

## 2024-06-19 DIAGNOSIS — E785 Hyperlipidemia, unspecified: Secondary | ICD-10-CM | POA: Insufficient documentation

## 2024-06-19 DIAGNOSIS — Z8673 Personal history of transient ischemic attack (TIA), and cerebral infarction without residual deficits: Secondary | ICD-10-CM | POA: Diagnosis not present

## 2024-06-19 DIAGNOSIS — R079 Chest pain, unspecified: Secondary | ICD-10-CM

## 2024-06-19 DIAGNOSIS — R030 Elevated blood-pressure reading, without diagnosis of hypertension: Secondary | ICD-10-CM

## 2024-06-19 DIAGNOSIS — G4733 Obstructive sleep apnea (adult) (pediatric): Secondary | ICD-10-CM | POA: Insufficient documentation

## 2024-06-19 DIAGNOSIS — I10 Essential (primary) hypertension: Secondary | ICD-10-CM | POA: Insufficient documentation

## 2024-06-19 DIAGNOSIS — R519 Headache, unspecified: Secondary | ICD-10-CM | POA: Diagnosis present

## 2024-06-19 DIAGNOSIS — K148 Other diseases of tongue: Secondary | ICD-10-CM | POA: Insufficient documentation

## 2024-06-19 DIAGNOSIS — Z85828 Personal history of other malignant neoplasm of skin: Secondary | ICD-10-CM | POA: Insufficient documentation

## 2024-06-19 DIAGNOSIS — K30 Functional dyspepsia: Secondary | ICD-10-CM | POA: Diagnosis present

## 2024-06-19 LAB — HEPATIC FUNCTION PANEL
ALT: 30 U/L (ref 0–44)
AST: 27 U/L (ref 15–41)
Albumin: 4.3 g/dL (ref 3.5–5.0)
Alkaline Phosphatase: 42 U/L (ref 38–126)
Bilirubin, Direct: 0.2 mg/dL (ref 0.0–0.2)
Indirect Bilirubin: 0.4 mg/dL (ref 0.3–0.9)
Total Bilirubin: 0.6 mg/dL (ref 0.0–1.2)
Total Protein: 7 g/dL (ref 6.5–8.1)

## 2024-06-19 LAB — BASIC METABOLIC PANEL WITH GFR
Anion gap: 10 (ref 5–15)
BUN: 10 mg/dL (ref 8–23)
CO2: 25 mmol/L (ref 22–32)
Calcium: 9.1 mg/dL (ref 8.9–10.3)
Chloride: 104 mmol/L (ref 98–111)
Creatinine, Ser: 0.83 mg/dL (ref 0.61–1.24)
GFR, Estimated: >60 mL/min (ref >60)
Glucose, Bld: 87 mg/dL (ref 70–99)
Potassium: 4.3 mmol/L (ref 3.5–5.1)
Sodium: 140 mmol/L (ref 135–145)

## 2024-06-19 LAB — CBC
HCT: 47.2 % (ref 39.0–52.0)
Hemoglobin: 16.3 g/dL (ref 13.0–17.0)
MCH: 31.2 pg (ref 26.0–34.0)
MCHC: 34.5 g/dL (ref 30.0–36.0)
MCV: 90.4 fL (ref 80.0–100.0)
Platelets: 184 K/uL (ref 150–400)
RBC: 5.22 MIL/uL (ref 4.22–5.81)
RDW: 12.2 % (ref 11.5–15.5)
WBC: 8.8 K/uL (ref 4.0–10.5)
nRBC: 0 % (ref 0.0–0.2)

## 2024-06-19 LAB — RESP PANEL BY RT-PCR (RSV, FLU A&B, COVID)  RVPGX2
Influenza A by PCR: NEGATIVE
Influenza B by PCR: NEGATIVE
Resp Syncytial Virus by PCR: NEGATIVE
SARS Coronavirus 2 by RT PCR: NEGATIVE

## 2024-06-19 LAB — D-DIMER, QUANTITATIVE: D-Dimer, Quant: <0.27 ug{FEU}/mL (ref 0.00–0.50)

## 2024-06-19 LAB — LIPASE, BLOOD: Lipase: 23 U/L (ref 11–51)

## 2024-06-19 LAB — TROPONIN T, HIGH SENSITIVITY
Troponin T High Sensitivity: 17 ng/L (ref 0–19)
Troponin T High Sensitivity: 18 ng/L (ref 0–19)

## 2024-06-19 MED ORDER — SUCRALFATE 1 G PO TABS
1.0000 g | ORAL_TABLET | Freq: Once | ORAL | Status: AC
  Administered 2024-06-19: 1 g via ORAL
  Filled 2024-06-19: qty 1

## 2024-06-19 MED ORDER — PANTOPRAZOLE SODIUM 40 MG IV SOLR
40.0000 mg | Freq: Once | INTRAVENOUS | Status: AC
  Administered 2024-06-19: 40 mg via INTRAVENOUS
  Filled 2024-06-19: qty 10

## 2024-06-19 MED ORDER — SUCRALFATE 1 G PO TABS: 1.0000 g | ORAL_TABLET | Freq: Three times a day (TID) | ORAL | 0 refills | Status: AC

## 2024-06-19 NOTE — ED Triage Notes (Signed)
 Pt c/o chest pain x 1 day. Also reports epigastric burning/indigestion  x 2 weeks, worsening. Denies n/v/diaphoresis.   BL cheek, tip of tongue numbness since  Monday. Pt speaking in clear, full sentences.   BL palm pain x 5 days.   BL ear pain x 3 days. Denies sinus congestion, sore throat.

## 2024-06-19 NOTE — Discharge Instructions (Addendum)
Have close follow-up with your primary care doctor.  Return to the emergency room if you have any worsening symptoms.

## 2024-06-19 NOTE — ED Provider Notes (Signed)
 [place] [facility] Provider Note   CSN: [id number] Arrival date & time: 06/19/24  1558     Patient presents with: Chest Pain and Numbness   Shane Sawyer is a 68 y.o. male.   Patient is a 68 year old male with a history of hypertension, hyperlipidemia, obstructive sleep apnea, prior subdural hematoma who presents with various symptoms.  He says he has been having some worsening indigestion over the last several days.  He has been eating a lot of tomatoes from the garden so he thinks that may have exacerbated.  He was previously out of his Prilosec and recently restarted it.  He describes discomfort in his abdomen and burning up into his chest.  Symptoms are similar to his prior reflux.  Yesterday, he was walking [place] some steps to the court house for jury duty and developed some tightness across his chest.  It radiated to his neck.  Did not radiate to his back or shoulders.  He said it felt a little different than his prior reflux symptoms.  He sat down and ultimately went away.  However it came back again today.  He did not have any associated shortness of breath.  No nausea vomiting or diaphoresis.  No prior history of heart problems other than a heart murmur.  No family history of early heart disease.  He also complains of some numbness in his tongue.  He said 3 days ago he felt like he was having allergic reaction and then he got numb in his tongue and tingling around his mouth and felt like his face was swelling.  He said it ultimately went away but he still has the numbness on his tongue.  He has some numbness around his mouth but it is bilateral.  No difficulty with his speech or vision.  No numbness or weakness to his extremities other than he has some peripheral neuropathy in his feet which is unchanged.  He did have a little dizziness today and also has an associated headache.  He has had a prior subdural hematoma.  He does drink alcohol but he says he does  not drink excessively.  He says he drinks usually about 1 drink at night and may be a little heavier on the weekends.  He has also had some mild URI symptoms.       Prior to Admission medications   Medication Sig Start Date End Date Taking? Authorizing Provider  sucralfate  (CARAFATE ) 1 g tablet Take 1 tablet (1 g total) by mouth 4 (four) times daily -  with meals and at bedtime. 06/19/24  Yes Lenor Hollering, MD  levETIRAcetam  (KEPPRA ) 500 MG tablet TAKE 1 TABLET (500 MG TOTAL) BY MOUTH TWO TIMES DAILY. Patient not taking: No sig reported 11/11/20 11/11/21  Dawley, Lani C, DO  losartan -hydrochlorothiazide (HYZAAR) 100-25 MG tablet Take 1 tablet by mouth daily. Patient not taking: Reported on 12/23/2020 06/29/17   Levora Reyes SAUNDERS, MD  pantoprazole  (PROTONIX ) 40 MG tablet Take 40 mg by mouth daily. 06/30/20   [provider]  spironolactone (ALDACTONE) 25 MG tablet Take 12.5 mg by mouth daily. 10/14/20   [provider]  tadalafil  (CIALIS ) 20 MG tablet Take 20 mg by mouth every evening.    [provider]  valsartan (DIOVAN) 320 MG tablet Take 320 mg by mouth daily. 10/14/20   [provider]    Allergies: Penicillins, Ivp dye [iodinated contrast media], and Elemental sulfur    Review of Systems  Constitutional:  Positive for fatigue. Negative for chills, diaphoresis and fever.  HENT:  Positive for congestion, ear pain (Ear pain and fullness) and rhinorrhea. Negative for sneezing.   Eyes: Negative.   Respiratory:  Positive for chest tightness. Negative for cough and shortness of breath.   Cardiovascular:  Positive for chest pain. Negative for leg swelling.  Gastrointestinal:  Positive for abdominal pain (Epigastric discomfort). Negative for blood in stool, diarrhea, nausea and vomiting.  Genitourinary:  Negative for difficulty urinating, flank pain, frequency and hematuria.  Musculoskeletal:  Negative for arthralgias and back pain.  Skin:  Negative for rash.   Neurological:  Positive for light-headedness, numbness and headaches. Negative for speech difficulty and weakness.    Updated Vital Signs BP (!) 172/105   Pulse (!) 105   Temp 98.1 F (36.7 C) (Oral)   Resp 14   Ht 6' 1 (1.854 m)   Wt 103 kg   SpO2 97%   BMI 29.95 kg/m   Physical Exam Constitutional:      Appearance: He is well-developed.  HENT:     Head: Normocephalic and atraumatic.  Eyes:     Pupils: Pupils are equal, round, and reactive to light.  Cardiovascular:     Rate and Rhythm: Normal rate and regular rhythm.     Heart sounds: Murmur heard.  Pulmonary:     Effort: Pulmonary effort is normal. No respiratory distress.     Breath sounds: Normal breath sounds. No wheezing or rales.  Chest:     Chest wall: No tenderness.  Abdominal:     General: Bowel sounds are normal.     Palpations: Abdomen is soft.     Tenderness: There is no abdominal tenderness. There is no guarding or rebound.  Musculoskeletal:        General: Normal range of motion.     Cervical back: Normal range of motion and neck supple.     Comments: No edema or calf tenderness  Lymphadenopathy:     Cervical: No cervical adenopathy.  Skin:    General: Skin is warm and dry.     Findings: No rash.  Neurological:     Mental Status: He is alert and oriented to person, place, and time.     Comments: Motor 5/5 all extremities Sensation grossly intact to LT all extremities Finger to Nose intact, no pronator drift CN II-XII grossly intact       (all labs ordered are listed, but only abnormal results are displayed) Labs Reviewed  RESP PANEL BY RT-PCR (RSV, FLU A&B, COVID)  [id number]  BASIC METABOLIC PANEL WITH GFR  CBC  D-DIMER, QUANTITATIVE  HEPATIC FUNCTION PANEL  LIPASE, BLOOD  TROPONIN T, HIGH SENSITIVITY  TROPONIN T, HIGH SENSITIVITY    EKG: EKG Interpretation Date/Time:  Wednesday June 19 2024 16:07:53 EDT Ventricular Rate:  95 PR Interval:  182 QRS Duration:  102 QT  Interval:  352 QTC Calculation: 443 R Axis:   97  Text Interpretation: Sinus rhythm Right axis deviation Minimal ST depression, inferior leads since last tracing no significant change Confirmed by Lenor Hollering [phone]) on 06/19/2024 4:10:37 PM  Radiology: CT Head Wo Contrast Result Date: 06/19/2024 EXAM: CT HEAD WITHOUT CONTRAST 06/19/2024 05:01:46 PM TECHNIQUE: CT of the head was performed without the administration of intravenous contrast. Automated exposure control, iterative reconstruction, and/or weight based adjustment of the mA/kV was utilized to reduce the radiation dose to as low as reasonably achievable. COMPARISON: 03/02/2021 CLINICAL HISTORY: Headache, new onset (Age >= 51y). Chest pain,  headache today. FINDINGS: BRAIN AND VENTRICLES: No acute hemorrhage. Gray-white differentiation is preserved. No hydrocephalus. No extra-axial collection. No mass effect or midline shift. ORBITS: No acute abnormality. SINUSES: No acute abnormality. SOFT TISSUES AND SKULL: No acute soft tissue abnormality. No skull fracture. IMPRESSION: 1. No acute intracranial abnormality. Electronically signed by: Franky Stanford MD 06/19/2024 05:13 PM EDT RP Workstation: [id number]   DG Chest 2 View Result Date: 06/19/2024 CLINICAL DATA:  Chest pain EXAM: CHEST - 2 VIEW COMPARISON:  Chest x-ray 11/21/2023 FINDINGS: The lungs are clear. There is no pleural effusion or pneumothorax. Cardiomediastinal silhouette within normal limits. No acute fractures. IMPRESSION: No active cardiopulmonary disease. Electronically Signed   By: Greig Pique M.D.   On: 06/19/2024 17:10     Procedures   Medications Ordered in the ED  pantoprazole  (PROTONIX ) injection 40 mg (40 mg Intravenous Given 06/19/24 1718)  sucralfate  (CARAFATE ) tablet 1 g (1 g Oral Given 06/19/24 1809)                                    Medical Decision Making Amount and/or Complexity of Data Reviewed Labs: ordered. Radiology: ordered.  Risk Prescription  drug management.   This patient presents to the ED for concern of abdominal pain, chest pain, this involves an extensive number of treatment options, and is a complaint that carries with it a high risk of complications and morbidity.  I considered the following differential and admission for this acute, potentially life threatening condition.  The differential diagnosis includes GERD, peptic ulcer disease, pancreatitis, cholecystitis, ACS, aortic dissection, aneurysm, PE  MDM:    Patient is a 68 year old who presents with a couple different complaints.  His main complaint is some discomfort in his epigastrium with burning pain up into his chest.  He says it feels similar to when he had prior peptic ulcer disease.  He has been eating a lot of tomatoes out of his garden recently which he feels like exacerbated.  He does not have any significant abdominal pain on exam.  His lipase is normal without concerns for pancreatitis.  LFTs are normal.  No pain in the right upper quadrant which be more concerning for gallbladder disease.  He also has some intermittent chest discomfort which seems to be questionably related to the reflux symptoms.  He did have worsening symptoms when he was walking into the court today as well as yesterday.  He does not have shortness of breath, diaphoresis or other associated symptoms with it.  His EKG does not show any ischemic changes.  He has had 2 negative troponins.  Chest x-ray does not show any widened mediastinum.  He does not have any symptoms that radiate to his back or other symptoms that would be more concerning for aortic dissection.  His D-dimer was normal.  No other symptoms that would be more concerning for PE.  I did have him ambulate around the ED and he actually walked up 2 flights of stairs here in the emergency department with no return of the chest discomfort that he had previously on exertion.  He has some numbness in his tongue.  Not sure exactly what is causing  that.  Head CT does not show any acute abnormality.  He does not have other symptoms that we more concerning for stroke.  No facial drooping or slurred speech.  He had some tingling all around his lips and some apparent facial  swelling recently that has resolved.  Question whether this was an allergic reaction although he is not having those symptoms now.  His blood pressure is noted to be elevated.  He states he previously was on blood pressure medicine but he said the last time he checked it it was okay.  Discussed starting him back on blood pressure medication but he declines at this point and says he will follow-up with his primary care doctor to have it rechecked.  He feels like he is a little anxious here and wants to have it rechecked before he starts back on blood pressure medication.  He was discharged home in good condition.  Return precautions were given.  (Labs, imaging, consults)  Labs: I Ordered, and personally interpreted labs.  The pertinent results include: Normal lipase, LFTs normal, troponin is normal  Imaging Studies ordered: I ordered imaging studies including chest x-ray I independently visualized and interpreted imaging. I agree with the radiologist interpretation  Additional history obtained from chart.  External records from outside source obtained and reviewed including iron notes  Cardiac Monitoring: The patient was maintained on a cardiac monitor.  If on the cardiac monitor, I personally viewed and interpreted the cardiac monitored which showed an underlying rhythm of: Sinus rhythm  Reevaluation: After the interventions noted above, I reevaluated the patient and found that they have :improved  Social Determinants of Health:  alcohol use  Disposition: Discharged to home  Co morbidities that complicate the patient evaluation  Past Medical History:  Diagnosis Date   Cancer (HCC)    skin, left temple   Erectile dysfunction    HBP (high blood pressure)     History of echocardiogram    Echo 3/18: EF 55-60, no RWMA, PASP 27   History of nuclear stress test    Myoview  3/18: EF 50, inf thinning, no ischemia, normal wall motion; Low Risk     Medicines Meds ordered this encounter  Medications   pantoprazole  (PROTONIX ) injection 40 mg   sucralfate  (CARAFATE ) tablet 1 g   sucralfate  (CARAFATE ) 1 g tablet    Sig: Take 1 tablet (1 g total) by mouth 4 (four) times daily -  with meals and at bedtime.    Dispense:  30 tablet    Refill:  0    I have reviewed the patients home medicines and have made adjustments as needed  Problem List / ED Course: Problem List Items Addressed This Visit   None Visit Diagnoses       Chest pain, unspecified type    -  Primary     Gastroesophageal reflux disease with esophagitis without hemorrhage         Elevated blood pressure reading                    Final diagnoses:  Chest pain, unspecified type  Gastroesophageal reflux disease with esophagitis without hemorrhage  Elevated blood pressure reading    ED Discharge Orders          Ordered    sucralfate  (CARAFATE ) 1 g tablet  3 times daily with meals & bedtime        06/19/24 2019               Lenor Hollering, MD 06/19/24 2021

## 2024-06-19 NOTE — ED Notes (Signed)
 Ambulated pt, reports he is still feeling discomfort and now has a HA, vitals remained stable, no distress noted

## 2024-08-01 ENCOUNTER — Telehealth (HOSPITAL_COMMUNITY): Payer: Self-pay

## 2024-08-01 NOTE — Telephone Encounter (Signed)
 Outside/paper referral received by Dr. Burnie from [facility]. Will fax over request for MD order and EKG. Insurance benefits and eligibility to be determined.   Patient confirmed interest.  F/u with cardiologist 10/21.

## 2024-08-06 NOTE — Telephone Encounter (Signed)
 Called wife Corean back.  Patient is currently at PCP office having BP checked.  She reports that BP have been 94-100/70's up until today.  He has also experienced some lightheadedness.  Zofran  was sent in for nausea yesterday and that helped. She feels the metoprolol  is making his BP to low.  Advised her that it would be appropriate if PCP wanted to adjust his medications.   She will call back with an update after PCP appointment with an update or any further concerns.

## 2024-08-26 ENCOUNTER — Emergency Department (HOSPITAL_BASED_OUTPATIENT_CLINIC_OR_DEPARTMENT_OTHER)
Admission: EM | Admit: 2024-08-26 | Discharge: 2024-08-26 | Disposition: A | Discharge status: Other Acute Inpatient Hospital | Payer: Medicare (Managed Care) | Source: Ambulatory Visit | Attending: Emergency Medicine | Admitting: Emergency Medicine

## 2024-08-26 ENCOUNTER — Encounter (HOSPITAL_BASED_OUTPATIENT_CLINIC_OR_DEPARTMENT_OTHER): Payer: Self-pay

## 2024-08-26 ENCOUNTER — Emergency Department (HOSPITAL_BASED_OUTPATIENT_CLINIC_OR_DEPARTMENT_OTHER): Payer: Medicare (Managed Care)

## 2024-08-26 ENCOUNTER — Other Ambulatory Visit: Payer: Self-pay

## 2024-08-26 DIAGNOSIS — I10 Essential (primary) hypertension: Secondary | ICD-10-CM | POA: Insufficient documentation

## 2024-08-26 DIAGNOSIS — R002 Palpitations: Secondary | ICD-10-CM | POA: Insufficient documentation

## 2024-08-26 DIAGNOSIS — Z952 Presence of prosthetic heart valve: Secondary | ICD-10-CM | POA: Diagnosis not present

## 2024-08-26 DIAGNOSIS — I4892 Unspecified atrial flutter: Secondary | ICD-10-CM | POA: Diagnosis not present

## 2024-08-26 DIAGNOSIS — J9811 Atelectasis: Secondary | ICD-10-CM | POA: Insufficient documentation

## 2024-08-26 DIAGNOSIS — I639 Cerebral infarction, unspecified: Secondary | ICD-10-CM | POA: Diagnosis not present

## 2024-08-26 DIAGNOSIS — K8020 Calculus of gallbladder without cholecystitis without obstruction: Secondary | ICD-10-CM | POA: Diagnosis not present

## 2024-08-26 DIAGNOSIS — Z79899 Other long term (current) drug therapy: Secondary | ICD-10-CM | POA: Diagnosis not present

## 2024-08-26 DIAGNOSIS — E785 Hyperlipidemia, unspecified: Secondary | ICD-10-CM | POA: Insufficient documentation

## 2024-08-26 DIAGNOSIS — Z85828 Personal history of other malignant neoplasm of skin: Secondary | ICD-10-CM | POA: Insufficient documentation

## 2024-08-26 DIAGNOSIS — I4510 Unspecified right bundle-branch block: Secondary | ICD-10-CM | POA: Insufficient documentation

## 2024-08-26 HISTORY — DX: Nontraumatic intracerebral hemorrhage, unspecified: I61.9

## 2024-08-26 LAB — COMPREHENSIVE METABOLIC PANEL WITH GFR
ALT: 27 U/L (ref 0–44)
AST: 25 U/L (ref 15–41)
Albumin: 4.1 g/dL (ref 3.5–5.0)
Alkaline Phosphatase: 55 U/L (ref 38–126)
Anion gap: 12 (ref 5–15)
BUN: 12 mg/dL (ref 8–23)
CO2: 25 mmol/L (ref 22–32)
Calcium: 9.3 mg/dL (ref 8.9–10.3)
Chloride: 103 mmol/L (ref 98–111)
Creatinine, Ser: 0.86 mg/dL (ref 0.61–1.24)
GFR, Estimated: >60 mL/min (ref >60)
Glucose, Bld: 124 mg/dL — ABNORMAL HIGH (ref 70–99)
Potassium: 4 mmol/L (ref 3.5–5.1)
Sodium: 139 mmol/L (ref 135–145)
Total Bilirubin: 1 mg/dL (ref 0.0–1.2)
Total Protein: 6.9 g/dL (ref 6.5–8.1)

## 2024-08-26 LAB — CBC WITH DIFFERENTIAL/PLATELET
Abs Immature Granulocytes: 0.05 K/uL (ref 0.00–0.07)
Basophils Absolute: 0 K/uL (ref 0.0–0.1)
Basophils Relative: 0 %
Eosinophils Absolute: 0.3 K/uL (ref 0.0–0.5)
Eosinophils Relative: 4 %
HCT: 42.7 % (ref 39.0–52.0)
Hemoglobin: 14 g/dL (ref 13.0–17.0)
Immature Granulocytes: 1 %
Lymphocytes Relative: 25 %
Lymphs Abs: 2.1 K/uL (ref 0.7–4.0)
MCH: 29.5 pg (ref 26.0–34.0)
MCHC: 32.8 g/dL (ref 30.0–36.0)
MCV: 90.1 fL (ref 80.0–100.0)
Monocytes Absolute: 1 K/uL (ref 0.1–1.0)
Monocytes Relative: 11 %
Neutro Abs: 4.9 K/uL (ref 1.7–7.7)
Neutrophils Relative %: 59 %
Platelets: 195 K/uL (ref 150–400)
RBC: 4.74 MIL/uL (ref 4.22–5.81)
RDW: 13.8 % (ref 11.5–15.5)
WBC: 8.3 K/uL (ref 4.0–10.5)
nRBC: 0 % (ref 0.0–0.2)

## 2024-08-26 LAB — LIPASE, BLOOD: Lipase: 20 U/L (ref 11–51)

## 2024-08-26 LAB — PRO BRAIN NATRIURETIC PEPTIDE: Pro Brain Natriuretic Peptide: 1252 pg/mL — ABNORMAL HIGH (ref <300.0)

## 2024-08-26 LAB — TROPONIN T, HIGH SENSITIVITY: Troponin T High Sensitivity: <15 ng/L (ref 0–19)

## 2024-08-26 LAB — OCCULT BLOOD X 1 CARD TO LAB, STOOL: Fecal Occult Bld: NEGATIVE

## 2024-08-26 MED ORDER — DILTIAZEM HCL-DEXTROSE 125-5 MG/125ML-% IV SOLN (PREMIX)
5.0000 mg/h | INTRAVENOUS | Status: DC
  Administered 2024-08-26: 10 mg/h via INTRAVENOUS
  Filled 2024-08-26: qty 125

## 2024-08-26 MED ORDER — METOPROLOL TARTRATE 5 MG/5ML IV SOLN
2.5000 mg | INTRAVENOUS | Status: AC | PRN
  Administered 2024-08-26 (×3): 2.5 mg via INTRAVENOUS
  Filled 2024-08-26 (×2): qty 5

## 2024-08-26 MED ORDER — DILTIAZEM LOAD VIA INFUSION
10.0000 mg | Freq: Once | INTRAVENOUS | Status: AC
  Administered 2024-08-26: 10 mg via INTRAVENOUS
  Filled 2024-08-26: qty 10

## 2024-08-26 NOTE — ED Triage Notes (Addendum)
 Had aortic valve replacement a month ago, has been in and out of the hospital since. Dx with H. Pylori recently, taking abx. C/o palpitations with HR >150 and hypotension at home, chest pain.  Dr. Burnie performed surgery at [facility].  Pt states his stools have been dark green/black the past few days

## 2024-08-26 NOTE — ED Provider Notes (Addendum)
 [facility] Provider Note   CSN: [id number] Arrival date & time: 08/26/24  9146     Patient presents with: Palpitations   [facility] Sawyer is a 68 y.o. male.   HPI     68 year old male with a history of Sawyer Sawyer Sawyer Sawyer Sawyer Sawyer Sawyer, taken back to the OR initially with concern for tamponade, normal coronaries on cath 07/2024, recent diagnosis of HPylori on antibiotics, presents with concern for palpitations.   Reports last night felt a little off, palpitations and fluttering in chest, did not check HR-today checked HR and found it to be high. Continuing palpitations, fluttering. No chest pain or dyspnea.   He has had some upper abdominal discomfort off and on for months.  For 2-3 days has had dark green-black colored stool.  Called the nurse with CT surgery and they were worried about sepsis and recommended going to the ED. Has not had any fever, no severe abdominal pain, no urinary symptoms. Has had mild throat clearing type of cough.    Past Medical History:  Diagnosis Date   Cancer (HCC)    skin, left temple   Cerebral hemorrhage (HCC)    Erectile dysfunction    HBP (high blood pressure)    History of echocardiogram    Echo 3/18: EF 55-60, no RWMA, PASP 27   History of nuclear stress test    Myoview  3/18: EF 50, inf thinning, no ischemia, normal wall motion; Low Risk     Prior to Admission medications   Medication Sig Start Date End Date Taking? Authorizing Provider  atorvastatin (LIPITOR) 20 MG tablet Take 20 mg by mouth at bedtime. 08/03/24  Yes [provider]  furosemide (LASIX) 40 MG tablet Take 40 mg by mouth See admin instructions. Take one tablet daily for 5 days. Then take one tab as needed for lower extremity swelling or weight gain >3 pounds in 1 day or >5 pounds in 1 week. If you start taking for swelling or weight gain, please call the office to inform us . 08/03/24  Yes  [provider]  metoprolol  succinate (TOPROL -XL) 25 MG 24 hr tablet Take 37.5 mg by mouth daily. 08/03/24 11/01/24 Yes [provider]  metroNIDAZOLE (FLAGYL) 500 MG tablet Take 500 mg by mouth 4 (four) times daily. 08/19/24 09/03/24 Yes [provider]  omeprazole (PRILOSEC) 40 MG capsule Take 40 mg by mouth 2 (two) times daily. 08/19/24 08/19/25 Yes [provider]  ondansetron  (ZOFRAN ) 4 MG tablet Take 4 mg by mouth every 8 (eight) hours as needed. 08/05/24  Yes [provider]  tetracycline (SUMYCIN) 500 MG capsule Take 500 mg by mouth 4 (four) times daily. 08/19/24 09/03/24 Yes [provider]  levETIRAcetam  (KEPPRA ) 500 MG tablet TAKE 1 TABLET (500 MG TOTAL) BY MOUTH TWO TIMES DAILY. Patient not taking: No sig reported 11/11/20 11/11/21  Dawley, Lani C, DO  losartan -hydrochlorothiazide (HYZAAR) 100-25 MG tablet Take 1 tablet by mouth daily. Patient not taking: Reported on 2/23/[facility] 06/29/17   Levora Reyes SAUNDERS, MD  pantoprazole  (PROTONIX ) 40 MG tablet Take 40 mg by mouth daily. 06/30/20   [provider]  spironolactone (ALDACTONE) 25 MG tablet Take 12.5 mg by mouth daily. 10/14/20   [provider]  sucralfate  (CARAFATE ) 1 g tablet Take 1 tablet (1 g total) by mouth 4 (four) times daily -  with meals and at bedtime. 06/19/24   Lenor Hollering, MD  tadalafil  (CIALIS ) 20 MG tablet  Take 20 mg by mouth every evening.    [provider]  valsartan (DIOVAN) 320 MG tablet Take 320 mg by mouth daily. 10/14/20   [provider]    Allergies: Penicillins, Ivp dye [iodinated contrast media], and Elemental sulfur    Review of Systems  Updated Vital Signs BP 113/73   Pulse 80   Temp 99.1 F (37.3 C) (Oral)   Resp (!) 21   Ht 6' 1 (1.854 m)   Wt 97.5 kg   SpO2 99%   BMI 28.37 kg/m   Physical Exam Vitals and nursing note reviewed.  Constitutional:      General: He is not in acute distress.    Appearance:  Normal appearance. He is well-developed. He is not ill-appearing or diaphoretic.  HENT:     Head: Normocephalic and atraumatic.  Eyes:     General: No visual field deficit.    Extraocular Movements: Extraocular movements intact.     Conjunctiva/sclera: Conjunctivae normal.     Pupils: Pupils are equal, round, and reactive to light.  Cardiovascular:     Rate and Rhythm: Regular rhythm. Tachycardia present.     Pulses: Normal pulses.     Heart sounds: Normal heart sounds. No murmur heard.    No friction rub. No gallop.     Comments: Incision clean, dry, intact, no erythema Pulmonary:     Effort: Pulmonary effort is normal. No respiratory distress.     Breath sounds: Normal breath sounds. No wheezing or rales.  Abdominal:     General: There is no distension.     Palpations: Abdomen is soft.     Tenderness: There is no abdominal tenderness. There is no guarding.  Musculoskeletal:        General: No swelling or tenderness.     Cervical back: Normal range of motion.  Skin:    General: Skin is warm and dry.     Findings: No erythema or rash.  Neurological:     General: No focal deficit present.     Mental Status: He is alert and oriented to person, place, and time.     GCS: GCS eye subscore is 4. GCS verbal subscore is 5. GCS motor subscore is 6.     Cranial Nerves: No cranial nerve deficit, dysarthria or facial asymmetry.     Sensory: Sensory deficit (reports left foot feels more numb than right) present.     Motor: No weakness or tremor.     Coordination: Coordination normal. Finger-Nose-Finger Test normal.     Gait: Gait normal.     (all labs ordered are listed, but only abnormal results are displayed) Labs Reviewed  COMPREHENSIVE METABOLIC PANEL WITH GFR - Abnormal; Notable for the following components:      Result Value   Glucose, Bld 124 (*)    All other components within normal limits  PRO BRAIN NATRIURETIC PEPTIDE - Abnormal; Notable for the following components:   Pro  Brain Natriuretic Peptide 1,252.0 (*)    All other components within normal limits  CBC WITH DIFFERENTIAL/PLATELET  LIPASE, BLOOD  OCCULT BLOOD X 1 CARD TO LAB, STOOL  TROPONIN T, HIGH SENSITIVITY  TROPONIN T, HIGH SENSITIVITY    EKG: EKG Interpretation Date/Time:  Monday August 26 2024 09:01:08 EDT Ventricular Rate:  144 PR Interval:  134 QRS Duration:  112 QT Interval:  309 QTC Calculation: 479 R Axis:   268  Text Interpretation: Atrial flutter with RVR Incomplete right bundle branch block Borderline prolonged QT  interval Atrial flutter is new since prior ECG Confirmed by Dreama Longs ([id number]) on 08/26/2024 10:48:59 AM  Radiology: CT Chest Wo Contrast Result Date: 08/26/2024 EXAM: CT CHEST WITHOUT CONTRAST 08/26/2024 12:21:00 PM TECHNIQUE: CT of the chest was performed without the administration of intravenous contrast. Multiplanar reformatted images are provided for review. Automated exposure control, iterative reconstruction, and/or weight based adjustment of the mA/kV was utilized to reduce the radiation dose to as low as reasonably achievable. COMPARISON: None available. CLINICAL HISTORY: Chest wall pain, nontraumatic, infection or inflammation suspected, xray done; sternotomy wire in abnormal postion on xr, contrast allergy. Recent aortic Sawyer replacement. Hypotensive at home, cp, palpitations. FINDINGS: MEDIASTINUM: Heart and pericardium are unremarkable. The central airways are clear. LYMPH NODES: No mediastinal, hilar or axillary lymphadenopathy. LUNGS AND PLEURA: Mild right basal atelectasis. No focal consolidation or pulmonary edema. No pleural effusion or pneumothorax. SOFT TISSUES/BONES: Hemicranial anatomy. Vertical cerclage noted. 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 UPPER ABDOMEN: Limited images of the upper abdomen demonstrates a small gallstone. IMPRESSION: 1. Post hemiasternotomy. no suspicious fluid collections  to suggest infection. . 2. Mild right basilar atelectasis. 3. Cholelithiasis. Electronically signed by: Norleen Boxer MD 08/26/2024 02:24 PM EDT RP Workstation: [id number]   CT Head Wo Contrast Result Date: 08/26/2024 EXAM: CT HEAD WITHOUT CONTRAST 08/26/2024 12:21:00 PM TECHNIQUE: CT of the head was performed without the administration of intravenous contrast. Automated exposure control, iterative reconstruction, and/or weight based adjustment of the mA/kV was utilized to reduce the radiation dose to as low as reasonably achievable. COMPARISON: Head CT 06/19/2024. CLINICAL HISTORY: 68 year old male. Acute neuro deficit, stroke suspected. Recent aortic Sawyer replacement. Hypotension, chest pain, palpitations. FINDINGS: BRAIN AND VENTRICLES: No acute intracranial hemorrhage. Wedge shaped 1.5 cm area of confluent hypodensity in the right inferior cerebellum compatible with acute or subacute right PICA infarct, new since 06/19/2024 (series 2 image 6). Chronic heterogeneity bilateral inferior frontal gyri stable since 06/19/2024, appears to be subacute or chronic blood products superimposed on chronic inferior frontal gyrus and cephalomalacia hypodense and cephalomalacia there has been present since Sawyer head CT. No regional edema or mass effect. Elsewhere gray white differentiation stable since 06/19/2024. No other cortically based infarct identified. Calcified atherosclerosis at the skull base. No suspicious intracranial vascular hyperdensity. No hydrocephalus. No extra-axial collection. No mass effect or midline shift. ORBITS: No acute abnormality. SINUSES: Visible paranasal sinuses, middle ears and mastoids remain clear. SOFT TISSUES AND SKULL: No acute soft tissue abnormality. No skull fracture. No gaze deviation. IMPRESSION: 1. 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 No acute hemorrhage, no mass effect. 2. No other acute intracranial abnormality. But  positive also for chronic heterogeneity at the inferior frontal gyri, which appears to be chronic hemorrhagic contusion. Electronically signed by: Helayne Hurst MD 08/26/2024 12:41 PM EDT RP Workstation: [id number]   US  Venous Img Lower Unilateral Left Result Date: 08/26/2024 CLINICAL DATA:  LEFT thigh and calf cramping EXAM: LEFT LOWER EXTREMITY VENOUS DOPPLER ULTRASOUND TECHNIQUE: Gray-scale sonography with compression, as well as color and duplex ultrasound, were performed to evaluate the deep venous system(s) from the level of the common femoral vein through the popliteal and proximal calf veins. COMPARISON:  None available FINDINGS: VENOUS Normal compressibility of the common femoral, superficial femoral, and popliteal veins, as well as the visualized calf veins. Visualized portions of profunda femoral vein and great saphenous vein unremarkable. No filling defects to suggest DVT on grayscale or color  Doppler imaging. Doppler waveforms show normal direction of venous flow, normal respiratory plasticity and response to augmentation. Limited views of the contralateral common femoral vein are unremarkable. OTHER None. Limitations: none IMPRESSION: No DVT of the left lower extremity. Electronically Signed   By: Aliene Lloyd M.D.   On: 08/26/2024 11:43   DG Chest Portable 1 View Result Date: 08/26/2024 EXAM: 1 VIEW XRAY OF THE CHEST 08/26/2024 09:41:00 AM COMPARISON: 06/19/2024 CLINICAL HISTORY: Tachycardia, chest pain. FINDINGS: LUNGS AND PLEURA: Airway thickening suggests bronchitis or reactive airway disease. HEART AND MEDIASTINUM: Median sternotomy wires and prosthetic aortic Sawyer. The upper sternotomy wire has a oblique/vertical orientation which is unusual but may be intentional, correlate with postoperative course in excluding upper mediastinal dehiscence. Heart size within normal limits for projection. BONES AND SOFT TISSUES: Degenerative glenohumeral arthropathy. Surgical clips in right hilum.  IMPRESSION: 1. Airway thickening suggesting bronchitis or reactive airway disease. 2. Unusual oblique orientation of the upper sternotomy wire; correlate with postoperative course to exclude upper mediastinal dehiscence. 3. Degenerative glenohumeral arthropathy. Electronically signed by: Ryan Salvage MD 08/26/2024 10:07 AM EDT RP Workstation: [id number]     .Critical Care  Performed by: Dreama Longs, MD Authorized by: Dreama Longs, MD   Critical care provider statement:    Critical care time (minutes):  30   Critical care was time spent personally by me on the following activities:  Development of treatment plan with patient or surrogate, discussions with consultants, evaluation of patient's response to treatment, examination of patient, ordering and review of laboratory studies, ordering and review of radiographic studies, ordering and performing treatments and interventions, pulse oximetry, re-evaluation of patient's condition and review of old charts    Medications Ordered in the ED  diltiazem (CARDIZEM) 1 mg/mL load via infusion 10 mg (10 mg Intravenous Bolus from Bag 08/26/24 1047)    And  diltiazem (CARDIZEM) 125 mg in dextrose  5% 125 mL (1 mg/mL) infusion (10 mg/hr Intravenous New Bag/Given 08/26/24 1053)  metoprolol  tartrate (LOPRESSOR ) injection 2.5 mg (2.5 mg Intravenous Given 08/26/24 1003)                                      68 year old male with a history of Sawyer Sawyer Sawyer Sawyer Sawyer Sawyer Sawyer, taken back to the OR initially with concern for tamponade, normal coronaries on cath 07/2024, recent diagnosis of HPylori on antibiotics, presents with concern for palpitations.   DDx of tachycardia includes atrial arrhythmia, GI bleed/anemia, tamponade, PE, sepsis, dehydration, hyperthyroidism,  EKG completed and evaluated by me -- shows narrow complex regular tachycardia, do feel most consistent with atrial flutter with 1:1, HR  consistently 145.  Discussed with Dr. Barbaraann Cardiology who agrees that rhythm is consistent with atrial flutter.   However, given history of concern for dark stool, recent surgery consider other etiologies.    Labs completed and evaluated and interpreted by me show no anemia, no leukocytosis.  Fecal occult negative with dark stool noted on exam and with normal hemoglobin also do not suspect GI bleed as etiology of tachcyardia.  No leukocytosis, no fever, no infectious symptoms and low suspicion for sepsis.  Other labs show no significant electrolyte abnormalities, no signs of transaminitis or pancreatitis, troponin is within normal limits.  proBNP is mildly elevated.  Chest x-ray completed and evaluated by me and radiology shows airway thickening to suggest bronchitis, and also shows unusual oblique orientation of the  upper sternotomy wire.  The primary emergent concern for his visit today is his atrial flutter with RVR.  He does report several other symptoms which have been ongoing since he was in the hospital in September, including waxing and waning blurred vision in the right eye, some upper leg pain and waxing and waning pain and numbness to his foot.  Will obtain a CT head given these concerns, history of prior subdural hematoma years ago, and with new atrial fibrillation likely desire to initiate anticoagulation during his hospitalization.   Regarding leg pain--no asymmetric leg swelling or calf pain but with pain feel DVT study is reasonable.  He has normal pulses bilateral upper and lower extremities, no sign of acute arterial occlusion, and history does not sound consistent with dissection. Given recent AVR consider other aortic imaging but has anaphylaxis to contrast and cannot be performed emergently. Similarly, consider PE with recent surgery but overall low suspicion and with anaphylaxis to contrast will defer further work up.   Discussed with PA Thersia with Cardiology at [facility] (Dr. Zulema) and  CT Surgery Dr. Lucious who will consult on him after transfer.    For atrial flutter, was initially given metoprolol  2.5mg  x3 without improvement in rate, and BP briefly into 90s. Bedside US  without tamponade.  BP improved and placed on diltiazem gtt with HR overall improved and stable eon 10mg  dilt gtt.  CT head returned and does show acute or subacute right inferior cerebellar infarct new since 8/20.  Reports intermittent visual problems on right since his surgery and suspect this is subacute finding---further work up, Neurology evaluation can be consulted.  CT chest noncontrast without acute abnormalities-nonunion of sternum without suspicious fluid collections.         Final diagnoses:  Atrial flutter with rapid ventricular response (HCC)  H/O aortic Sawyer replacement  Cerebrovascular accident (CVA), unspecified mechanism [facility])    ED Discharge Orders     None          Dreama Longs, MD 08/26/24 1452    Dreama Longs, MD 08/26/24 1453

## 2024-08-26 NOTE — ED Notes (Signed)
 Report to [facility] hospital chrg nurse for room [id number]

## 2024-08-26 NOTE — ED Notes (Signed)
 Called [facility] for callback to ED Physician @[phone] @12 :31pm.  Need to update on Bed status. Transferred Call to ED Physician

## 2024-08-26 NOTE — ED Notes (Signed)
 Report to air care

## 2024-10-28 NOTE — Telephone Encounter (Signed)
 Provided patient the contact information for cardiac rehab department at [facility] in [place], [place], and will fax referral information. [facility] is too far from home and work location.

## 2024-10-29 ENCOUNTER — Telehealth (HOSPITAL_COMMUNITY): Payer: Self-pay

## 2024-10-29 NOTE — Telephone Encounter (Signed)
 Outside referral received from Dr. Burnie at [facility]. Patient confirmed he is still interested in program, is aware he needs to attend his f/u on 1/02 with his cardiologist.

## 2024-12-06 ENCOUNTER — Telehealth (HOSPITAL_COMMUNITY): Payer: Self-pay

## 2024-12-06 NOTE — Telephone Encounter (Signed)
 Attempted to call [employer] to verify benefits, unable to get through to representative on phone. Checked [employer], benefits are listed as the following:  Pt insurance is active and benefits verified through [employer]. Co-pay $10, DED $0/$0 met, out of pocket $3,900/$3 met, co-insurance 0%. YES pre-authorization required for TCR & ICR. Verified on 12/06/2024 @ 11:30am through website.  Will contact Dr. Sherwood office at [facility] regarding sending pre-authorization request.
